# Patient Record
Sex: Female | Born: 1945 | Race: White | Hispanic: No | Marital: Married | Smoking: Former smoker
Health system: Southern US, Community
[De-identification: ages and names within clinical notes are randomized; demographics above are authoritative.]

## PROBLEM LIST (undated history)

## (undated) DIAGNOSIS — K219 Gastro-esophageal reflux disease without esophagitis: Secondary | ICD-10-CM

## (undated) DIAGNOSIS — C229 Malignant neoplasm of liver, not specified as primary or secondary: Secondary | ICD-10-CM

## (undated) DIAGNOSIS — F32A Depression, unspecified: Secondary | ICD-10-CM

## (undated) DIAGNOSIS — F419 Anxiety disorder, unspecified: Secondary | ICD-10-CM

## (undated) DIAGNOSIS — E785 Hyperlipidemia, unspecified: Secondary | ICD-10-CM

## (undated) DIAGNOSIS — F329 Major depressive disorder, single episode, unspecified: Secondary | ICD-10-CM

## (undated) DIAGNOSIS — C3490 Malignant neoplasm of unspecified part of unspecified bronchus or lung: Secondary | ICD-10-CM

## (undated) DIAGNOSIS — I10 Essential (primary) hypertension: Secondary | ICD-10-CM

## (undated) HISTORY — DX: Essential (primary) hypertension: I10

## (undated) HISTORY — DX: Depression, unspecified: F32.A

## (undated) HISTORY — DX: Hyperlipidemia, unspecified: E78.5

## (undated) HISTORY — PX: GANGLION CYST EXCISION: SHX1691

## (undated) HISTORY — PX: BUNIONECTOMY: SHX129

## (undated) HISTORY — DX: Gastro-esophageal reflux disease without esophagitis: K21.9

## (undated) HISTORY — PX: CHOLECYSTECTOMY: SHX55

## (undated) HISTORY — DX: Anxiety disorder, unspecified: F41.9

## (undated) HISTORY — DX: Malignant neoplasm of unspecified part of unspecified bronchus or lung: C34.90

---
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
## 2000-03-26 ENCOUNTER — Other Ambulatory Visit: Admission: RE | Admit: 2000-03-26 | Discharge: 2000-03-26 | Payer: Self-pay | Admitting: Podiatry

## 2004-06-22 ENCOUNTER — Ambulatory Visit: Payer: Self-pay | Admitting: Family Medicine

## 2005-06-18 ENCOUNTER — Ambulatory Visit: Payer: Self-pay | Admitting: Family Medicine

## 2005-07-05 ENCOUNTER — Ambulatory Visit: Payer: Self-pay | Admitting: Family Medicine

## 2005-07-19 ENCOUNTER — Other Ambulatory Visit: Payer: Self-pay

## 2005-07-25 ENCOUNTER — Ambulatory Visit: Payer: Self-pay | Admitting: Unknown Physician Specialty

## 2006-07-31 ENCOUNTER — Ambulatory Visit: Payer: Self-pay | Admitting: Internal Medicine

## 2007-08-18 ENCOUNTER — Ambulatory Visit: Payer: Self-pay | Admitting: Internal Medicine

## 2008-11-11 ENCOUNTER — Ambulatory Visit: Payer: Self-pay | Admitting: Internal Medicine

## 2009-11-14 ENCOUNTER — Ambulatory Visit: Payer: Self-pay | Admitting: Internal Medicine

## 2010-01-08 ENCOUNTER — Ambulatory Visit: Payer: Self-pay | Admitting: Internal Medicine

## 2010-05-28 HISTORY — PX: COLONOSCOPY: SHX174

## 2010-06-12 ENCOUNTER — Ambulatory Visit: Payer: Self-pay | Admitting: Unknown Physician Specialty

## 2010-06-16 ENCOUNTER — Ambulatory Visit: Payer: Self-pay | Admitting: Otolaryngology

## 2010-12-14 ENCOUNTER — Ambulatory Visit: Payer: Self-pay | Admitting: Internal Medicine

## 2011-07-21 ENCOUNTER — Ambulatory Visit: Payer: Self-pay | Admitting: Unknown Physician Specialty

## 2011-09-10 ENCOUNTER — Ambulatory Visit: Payer: Self-pay | Admitting: Internal Medicine

## 2011-09-14 ENCOUNTER — Ambulatory Visit: Payer: Self-pay | Admitting: Internal Medicine

## 2011-09-17 ENCOUNTER — Ambulatory Visit: Payer: Self-pay | Admitting: Oncology

## 2011-09-17 LAB — CBC CANCER CENTER
Basophil #: 0.1 x10 3/mm (ref 0.0–0.1)
Eosinophil #: 0.1 x10 3/mm (ref 0.0–0.7)
HCT: 38.6 % (ref 35.0–47.0)
Lymphocyte #: 2 x10 3/mm (ref 1.0–3.6)
Lymphocyte %: 25.4 %
MCV: 88 fL (ref 80–100)
Monocyte %: 9 %
Platelet: 294 x10 3/mm (ref 150–440)
RDW: 13.6 % (ref 11.5–14.5)
WBC: 7.8 x10 3/mm (ref 3.6–11.0)

## 2011-09-17 LAB — COMPREHENSIVE METABOLIC PANEL
Albumin: 4.3 g/dL (ref 3.4–5.0)
Alkaline Phosphatase: 85 U/L (ref 50–136)
Anion Gap: 8 (ref 7–16)
BUN: 14 mg/dL (ref 7–18)
Calcium, Total: 9.6 mg/dL (ref 8.5–10.1)
EGFR (African American): >60
Glucose: 81 mg/dL (ref 65–99)
Osmolality: 275 (ref 275–301)
Potassium: 3.5 mmol/L (ref 3.5–5.1)
SGPT (ALT): 32 U/L

## 2011-09-17 LAB — PROTIME-INR: Prothrombin Time: 12.3 secs (ref 11.5–14.7)

## 2011-09-17 LAB — APTT: Activated PTT: 28.4 secs (ref 23.6–35.9)

## 2011-09-25 ENCOUNTER — Ambulatory Visit: Payer: Self-pay | Admitting: Cardiothoracic Surgery

## 2011-09-26 ENCOUNTER — Ambulatory Visit: Payer: Self-pay | Admitting: Cardiothoracic Surgery

## 2011-09-27 ENCOUNTER — Ambulatory Visit: Payer: Self-pay | Admitting: Oncology

## 2011-09-27 DIAGNOSIS — C3490 Malignant neoplasm of unspecified part of unspecified bronchus or lung: Secondary | ICD-10-CM

## 2011-09-27 HISTORY — PX: OTHER SURGICAL HISTORY: SHX169

## 2011-09-27 HISTORY — DX: Malignant neoplasm of unspecified part of unspecified bronchus or lung: C34.90

## 2011-10-04 DIAGNOSIS — C7A090 Malignant carcinoid tumor of the bronchus and lung: Secondary | ICD-10-CM | POA: Insufficient documentation

## 2011-10-04 DIAGNOSIS — I10 Essential (primary) hypertension: Secondary | ICD-10-CM | POA: Insufficient documentation

## 2011-10-04 DIAGNOSIS — E782 Mixed hyperlipidemia: Secondary | ICD-10-CM | POA: Insufficient documentation

## 2011-10-28 ENCOUNTER — Ambulatory Visit: Payer: Self-pay | Admitting: Oncology

## 2012-02-18 ENCOUNTER — Ambulatory Visit: Payer: Self-pay | Admitting: Internal Medicine

## 2012-08-27 DIAGNOSIS — K5900 Constipation, unspecified: Secondary | ICD-10-CM | POA: Insufficient documentation

## 2012-08-27 DIAGNOSIS — M1990 Unspecified osteoarthritis, unspecified site: Secondary | ICD-10-CM | POA: Insufficient documentation

## 2012-09-14 DIAGNOSIS — D649 Anemia, unspecified: Secondary | ICD-10-CM | POA: Insufficient documentation

## 2013-02-25 ENCOUNTER — Ambulatory Visit: Payer: Self-pay | Admitting: Family Medicine

## 2014-03-01 ENCOUNTER — Ambulatory Visit: Payer: Self-pay | Admitting: Family Medicine

## 2015-04-20 ENCOUNTER — Other Ambulatory Visit: Payer: Self-pay | Admitting: Family Medicine

## 2015-04-26 ENCOUNTER — Other Ambulatory Visit: Payer: Self-pay | Admitting: Family Medicine

## 2015-04-26 DIAGNOSIS — N951 Menopausal and female climacteric states: Secondary | ICD-10-CM

## 2015-04-26 DIAGNOSIS — Z1231 Encounter for screening mammogram for malignant neoplasm of breast: Secondary | ICD-10-CM

## 2015-05-02 ENCOUNTER — Ambulatory Visit
Admission: RE | Admit: 2015-05-02 | Discharge: 2015-05-02 | Disposition: A | Discharge status: Home / Self Care | Payer: Medicare HMO | Source: Ambulatory Visit | Attending: Family Medicine | Admitting: Family Medicine

## 2015-05-02 DIAGNOSIS — Z1231 Encounter for screening mammogram for malignant neoplasm of breast: Secondary | ICD-10-CM | POA: Diagnosis not present

## 2015-05-02 DIAGNOSIS — N951 Menopausal and female climacteric states: Secondary | ICD-10-CM

## 2015-05-02 DIAGNOSIS — M8580 Other specified disorders of bone density and structure, unspecified site: Secondary | ICD-10-CM | POA: Diagnosis not present

## 2015-05-02 DIAGNOSIS — M85852 Other specified disorders of bone density and structure, left thigh: Secondary | ICD-10-CM | POA: Diagnosis not present

## 2015-05-05 DIAGNOSIS — R69 Illness, unspecified: Secondary | ICD-10-CM | POA: Diagnosis not present

## 2015-05-13 DIAGNOSIS — I10 Essential (primary) hypertension: Secondary | ICD-10-CM | POA: Diagnosis not present

## 2015-05-13 DIAGNOSIS — R319 Hematuria, unspecified: Secondary | ICD-10-CM | POA: Diagnosis not present

## 2015-05-13 DIAGNOSIS — E7800 Pure hypercholesterolemia, unspecified: Secondary | ICD-10-CM | POA: Diagnosis not present

## 2015-05-13 DIAGNOSIS — D649 Anemia, unspecified: Secondary | ICD-10-CM | POA: Diagnosis not present

## 2015-05-20 DIAGNOSIS — E7800 Pure hypercholesterolemia, unspecified: Secondary | ICD-10-CM | POA: Diagnosis not present

## 2015-05-20 DIAGNOSIS — Z0000 Encounter for general adult medical examination without abnormal findings: Secondary | ICD-10-CM | POA: Diagnosis not present

## 2015-05-20 DIAGNOSIS — R319 Hematuria, unspecified: Secondary | ICD-10-CM | POA: Diagnosis not present

## 2015-05-20 DIAGNOSIS — M8580 Other specified disorders of bone density and structure, unspecified site: Secondary | ICD-10-CM | POA: Insufficient documentation

## 2015-05-20 DIAGNOSIS — I10 Essential (primary) hypertension: Secondary | ICD-10-CM | POA: Diagnosis not present

## 2015-06-23 ENCOUNTER — Encounter: Payer: Self-pay | Admitting: Internal Medicine

## 2015-06-23 ENCOUNTER — Other Ambulatory Visit: Payer: Self-pay | Admitting: Internal Medicine

## 2015-06-30 ENCOUNTER — Encounter: Payer: Self-pay | Admitting: Internal Medicine

## 2015-06-30 DIAGNOSIS — N952 Postmenopausal atrophic vaginitis: Secondary | ICD-10-CM | POA: Insufficient documentation

## 2015-06-30 DIAGNOSIS — K219 Gastro-esophageal reflux disease without esophagitis: Secondary | ICD-10-CM | POA: Insufficient documentation

## 2015-06-30 DIAGNOSIS — G4700 Insomnia, unspecified: Secondary | ICD-10-CM | POA: Insufficient documentation

## 2015-07-01 ENCOUNTER — Ambulatory Visit (INDEPENDENT_AMBULATORY_CARE_PROVIDER_SITE_OTHER): Payer: Medicare HMO | Admitting: Internal Medicine

## 2015-07-01 ENCOUNTER — Encounter: Payer: Self-pay | Admitting: Internal Medicine

## 2015-07-01 VITALS — BP 124/80 | HR 70 | Ht 63.0 in | Wt 168.0 lb

## 2015-07-01 DIAGNOSIS — G4700 Insomnia, unspecified: Secondary | ICD-10-CM

## 2015-07-01 DIAGNOSIS — M129 Arthropathy, unspecified: Secondary | ICD-10-CM

## 2015-07-01 DIAGNOSIS — G5603 Carpal tunnel syndrome, bilateral upper limbs: Secondary | ICD-10-CM

## 2015-07-01 DIAGNOSIS — I10 Essential (primary) hypertension: Secondary | ICD-10-CM | POA: Diagnosis not present

## 2015-07-01 DIAGNOSIS — M1712 Unilateral primary osteoarthritis, left knee: Secondary | ICD-10-CM | POA: Insufficient documentation

## 2015-07-01 DIAGNOSIS — E782 Mixed hyperlipidemia: Secondary | ICD-10-CM | POA: Diagnosis not present

## 2015-07-01 DIAGNOSIS — K219 Gastro-esophageal reflux disease without esophagitis: Secondary | ICD-10-CM

## 2015-07-01 DIAGNOSIS — R319 Hematuria, unspecified: Secondary | ICD-10-CM | POA: Diagnosis not present

## 2015-07-01 LAB — POC URINALYSIS WITH MICROSCOPIC (NON AUTO)MANUAL RESULT
BACTERIA UA: 0
BILIRUBIN UA: NEGATIVE
CRYSTALS: 0
EPITHELIAL CELLS, URINE PER MICROSCOPY: 2
Glucose, UA: NEGATIVE
Ketones, UA: NEGATIVE
Mucus, UA: 0
Nitrite, UA: NEGATIVE
PH UA: 7
PROTEIN UA: NEGATIVE
RBC: 1 M/uL — AB (ref 4.04–5.48)
Spec Grav, UA: 1.01
UROBILINOGEN UA: 0.2
WBC CASTS UA: 0

## 2015-07-01 NOTE — Patient Instructions (Signed)

## 2015-07-01 NOTE — Progress Notes (Signed)
Date:  07/01/2015   Name:  Alexandra Watson   DOB:  26-Oct-1945   MRN:  [id number]  Previous patient in my practice.  She transferred to [facility] after she was treated at [facility] for lung cancer.  She has decided to return to my clinic.  Chief Complaint: Establish Care Hypertension This is a chronic problem. The current episode started more than 1 year ago. The problem is unchanged. The problem is controlled. Pertinent negatives include no chest pain, headaches, palpitations or shortness of breath.  Hyperlipidemia This is a chronic problem. The current episode started more than 1 year ago. The problem is controlled. Pertinent negatives include no chest pain, myalgias or shortness of breath. Current antihyperlipidemic treatment includes statins (recently increased zocor). There are no compliance problems.   Hematuria - she has had some intermittent hematuria over the past year.  Twice she had E coli infection and twice she had mixed flora.  Her last UA showed only 1 RBC so no referral was done. Numbness - intermittent numbness in hands - wakes her at night and occurs when reading the newspaper.  No weakness and only slight discomfort. Insomnia - she stopped temazepam and is now taking trazodone along with benedryl if needed.  We discussed avoiding benedryl and perhaps taking 2 trazodone instead. 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  She is now on a 12 month follow up interval. 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  It is slowing worsening.  She is wondering about getting an Ortho evaluation, perhaps at [facility] or with Dr. Marry Guan.  Review of Systems  Constitutional: Negative for fever, chills and unexpected weight change.  Respiratory: Negative for cough, shortness of breath and wheezing.   Cardiovascular: Negative for chest pain, palpitations and leg swelling.  Gastrointestinal: Positive for constipation. Negative for vomiting, abdominal pain and blood in stool.  Genitourinary:  Positive for hematuria. Negative for dysuria, vaginal discharge, vaginal pain and pelvic pain.  Musculoskeletal: Positive for joint swelling, arthralgias and gait problem. Negative for myalgias.  Skin: Negative for rash.  Neurological: Positive for numbness. Negative for dizziness, tremors, weakness and headaches.  Psychiatric/Behavioral: Positive for sleep disturbance. Negative for dysphoric mood and decreased concentration. The patient is not nervous/anxious.     Patient Active Problem List   Diagnosis Date Noted  . Insomnia 06/30/2015  . Atrophic vaginitis 06/30/2015  . GERD (gastroesophageal reflux disease) 06/30/2015  . Osteopenia 05/20/2015  . Anemia 09/14/2012  . Constipation 08/27/2012  . Arthritis 08/27/2012  . Hypertension 10/04/2011  . Hyperlipidemia, mixed 10/04/2011  . 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 Authorizing Provider  hydrochlorothiazide (HYDRODIURIL) 25 MG tablet Take 1 tablet by mouth daily. 02/25/15 09/02/15 Yes Historical Provider, MD  metoprolol (LOPRESSOR) 50 MG tablet Take 1 tablet by mouth daily. 02/25/15 09/02/15 Yes Historical Provider, MD  ranitidine (ZANTAC) 150 MG tablet Take 1 tablet by mouth 2 (two) times daily. 09/14/14 09/14/15 Yes Historical Provider, MD  simvastatin (ZOCOR) 20 MG tablet Take 20 mg by mouth daily.   Yes Historical Provider, MD  traZODone (DESYREL) 50 MG tablet Take 50 mg by mouth at bedtime as needed for sleep.   Yes Historical Provider, MD  conjugated estrogens (PREMARIN) vaginal cream Reported on 07/01/2015 06/18/14   Historical Provider, MD    Allergies  Allergen Reactions  . Iodinated Diagnostic Agents Hives    Past Surgical History  Procedure Laterality  Date  . Vats sleeve lobectomy Left 09/2011    typical variant carcinoid  . Bunionectomy Bilateral 2001, 2005  . Colonoscopy  05/2010    normal  . Cholecystectomy    . Ganglion cyst excision Left      Social History  Substance Use Topics  . Smoking status: Former Research scientist (life sciences)  . Smokeless tobacco: None  . Alcohol Use: 1.2 oz/week    2 Standard drinks or equivalent per week     Medication list has been reviewed and updated.   Physical Exam  Constitutional: She is oriented to person, place, and time. She appears well-developed. No distress.  HENT:  Head: Normocephalic and atraumatic.  Neck: Normal range of motion. Neck supple. Carotid bruit is not present. No thyromegaly present.  Cardiovascular: Normal rate, regular rhythm and normal heart sounds.   Pulmonary/Chest: Effort normal and breath sounds normal. No respiratory distress. She has no wheezes. She has no rhonchi.  Musculoskeletal:       Left knee: She exhibits decreased range of motion. She exhibits no effusion.  Positive Tinel's on left, negative phalen's Negative tinel's and phalens on right Grip 5/5 Pulses intact  Neurological: She is alert and oriented to person, place, and time.  Skin: Skin is warm and dry. No rash noted.  Psychiatric: She has a normal mood and affect. Her speech is normal and behavior is normal. Thought content normal.    BP 124/80 mmHg  Pulse 70  Ht '5\' 3"'$  (1.6 m)  Wt 168 lb (76.204 kg)  BMI 29.77 kg/m2  Assessment and Plan: 1. Carpal tunnel syndrome, bilateral Wear cock-up wrist splints while sleeping  2. Essential hypertension controlled  3. Hyperlipidemia, mixed On statin therapy  4. Gastroesophageal reflux disease, esophagitis presence not specified Stable on H2 blocker  5. Insomnia Can take trazodone 100 mg at hs if needed Avoid diphenhydramine  6. Hematuria Will refer to Urology if persistent - POC urinalysis w microscopic (non auto)  7. Arthritis Consider Ortho referral for left knee  Halina Maidens, MD [facility]  07/01/2015

## 2015-07-28 HISTORY — PX: ANKLE ARTHROSCOPY WITH OPEN REDUCTION INTERNAL FIXATION (ORIF): SHX5582

## 2015-08-02 DIAGNOSIS — M5412 Radiculopathy, cervical region: Secondary | ICD-10-CM | POA: Diagnosis not present

## 2015-08-02 DIAGNOSIS — M9901 Segmental and somatic dysfunction of cervical region: Secondary | ICD-10-CM | POA: Diagnosis not present

## 2015-08-02 DIAGNOSIS — M9902 Segmental and somatic dysfunction of thoracic region: Secondary | ICD-10-CM | POA: Diagnosis not present

## 2015-08-02 DIAGNOSIS — M62830 Muscle spasm of back: Secondary | ICD-10-CM | POA: Diagnosis not present

## 2015-08-03 DIAGNOSIS — M5412 Radiculopathy, cervical region: Secondary | ICD-10-CM | POA: Diagnosis not present

## 2015-08-03 DIAGNOSIS — M9902 Segmental and somatic dysfunction of thoracic region: Secondary | ICD-10-CM | POA: Diagnosis not present

## 2015-08-03 DIAGNOSIS — M9901 Segmental and somatic dysfunction of cervical region: Secondary | ICD-10-CM | POA: Diagnosis not present

## 2015-08-03 DIAGNOSIS — M62830 Muscle spasm of back: Secondary | ICD-10-CM | POA: Diagnosis not present

## 2015-08-05 DIAGNOSIS — M9901 Segmental and somatic dysfunction of cervical region: Secondary | ICD-10-CM | POA: Diagnosis not present

## 2015-08-05 DIAGNOSIS — M5412 Radiculopathy, cervical region: Secondary | ICD-10-CM | POA: Diagnosis not present

## 2015-08-05 DIAGNOSIS — M9902 Segmental and somatic dysfunction of thoracic region: Secondary | ICD-10-CM | POA: Diagnosis not present

## 2015-08-05 DIAGNOSIS — M62830 Muscle spasm of back: Secondary | ICD-10-CM | POA: Diagnosis not present

## 2015-08-07 ENCOUNTER — Emergency Department
Admission: EM | Admit: 2015-08-07 | Discharge: 2015-08-08 | Payer: Medicare HMO | Attending: Emergency Medicine | Admitting: Emergency Medicine

## 2015-08-07 ENCOUNTER — Encounter: Payer: Self-pay | Admitting: Emergency Medicine

## 2015-08-07 ENCOUNTER — Emergency Department: Payer: Medicare HMO

## 2015-08-07 DIAGNOSIS — Z87891 Personal history of nicotine dependence: Secondary | ICD-10-CM | POA: Insufficient documentation

## 2015-08-07 DIAGNOSIS — Z79899 Other long term (current) drug therapy: Secondary | ICD-10-CM | POA: Diagnosis not present

## 2015-08-07 DIAGNOSIS — Y929 Unspecified place or not applicable: Secondary | ICD-10-CM | POA: Diagnosis not present

## 2015-08-07 DIAGNOSIS — Z85118 Personal history of other malignant neoplasm of bronchus and lung: Secondary | ICD-10-CM | POA: Diagnosis not present

## 2015-08-07 DIAGNOSIS — Y9301 Activity, walking, marching and hiking: Secondary | ICD-10-CM | POA: Diagnosis not present

## 2015-08-07 DIAGNOSIS — I10 Essential (primary) hypertension: Secondary | ICD-10-CM | POA: Diagnosis not present

## 2015-08-07 DIAGNOSIS — S99912A Unspecified injury of left ankle, initial encounter: Secondary | ICD-10-CM | POA: Diagnosis not present

## 2015-08-07 DIAGNOSIS — Y999 Unspecified external cause status: Secondary | ICD-10-CM | POA: Insufficient documentation

## 2015-08-07 DIAGNOSIS — Y33XXXA Other specified events, undetermined intent, initial encounter: Secondary | ICD-10-CM | POA: Diagnosis not present

## 2015-08-07 DIAGNOSIS — S82392A Other fracture of lower end of left tibia, initial encounter for closed fracture: Secondary | ICD-10-CM | POA: Diagnosis not present

## 2015-08-07 DIAGNOSIS — Z7982 Long term (current) use of aspirin: Secondary | ICD-10-CM | POA: Insufficient documentation

## 2015-08-07 DIAGNOSIS — W108XXA Fall (on) (from) other stairs and steps, initial encounter: Secondary | ICD-10-CM | POA: Diagnosis not present

## 2015-08-07 DIAGNOSIS — S82852A Displaced trimalleolar fracture of left lower leg, initial encounter for closed fracture: Secondary | ICD-10-CM | POA: Insufficient documentation

## 2015-08-07 DIAGNOSIS — E785 Hyperlipidemia, unspecified: Secondary | ICD-10-CM | POA: Diagnosis not present

## 2015-08-07 DIAGNOSIS — M1712 Unilateral primary osteoarthritis, left knee: Secondary | ICD-10-CM | POA: Insufficient documentation

## 2015-08-07 DIAGNOSIS — M7989 Other specified soft tissue disorders: Secondary | ICD-10-CM | POA: Diagnosis not present

## 2015-08-07 DIAGNOSIS — W109XXA Fall (on) (from) unspecified stairs and steps, initial encounter: Secondary | ICD-10-CM | POA: Diagnosis not present

## 2015-08-07 DIAGNOSIS — M21962 Unspecified acquired deformity of left lower leg: Secondary | ICD-10-CM

## 2015-08-07 DIAGNOSIS — S82851A Displaced trimalleolar fracture of right lower leg, initial encounter for closed fracture: Secondary | ICD-10-CM | POA: Diagnosis not present

## 2015-08-07 DIAGNOSIS — S82853A Displaced trimalleolar fracture of unspecified lower leg, initial encounter for closed fracture: Secondary | ICD-10-CM

## 2015-08-07 DIAGNOSIS — S82892A Other fracture of left lower leg, initial encounter for closed fracture: Secondary | ICD-10-CM

## 2015-08-07 DIAGNOSIS — M25572 Pain in left ankle and joints of left foot: Secondary | ICD-10-CM | POA: Diagnosis not present

## 2015-08-07 LAB — APTT: aPTT: 28 seconds (ref 24–36)

## 2015-08-07 LAB — CBC
HCT: 36.1 % (ref 35.0–47.0)
Hemoglobin: 12.3 g/dL (ref 12.0–16.0)
MCH: 29.5 pg (ref 26.0–34.0)
MCHC: 34.2 g/dL (ref 32.0–36.0)
MCV: 86.2 fL (ref 80.0–100.0)
PLATELETS: 232 10*3/uL (ref 150–440)
RBC: 4.19 MIL/uL (ref 3.80–5.20)
RDW: 13.1 % (ref 11.5–14.5)
WBC: 6.3 10*3/uL (ref 3.6–11.0)

## 2015-08-07 LAB — BASIC METABOLIC PANEL
ANION GAP: 9 (ref 5–15)
BUN: 20 mg/dL (ref 6–20)
CO2: 27 mmol/L (ref 22–32)
Calcium: 9.3 mg/dL (ref 8.9–10.3)
Chloride: 97 mmol/L — ABNORMAL LOW (ref 101–111)
Creatinine, Ser: 0.94 mg/dL (ref 0.44–1.00)
GLUCOSE: 94 mg/dL (ref 65–99)
POTASSIUM: 3.4 mmol/L — AB (ref 3.5–5.1)
Sodium: 133 mmol/L — ABNORMAL LOW (ref 135–145)

## 2015-08-07 LAB — PROTIME-INR
INR: 0.97
PROTHROMBIN TIME: 13.1 s (ref 11.4–15.0)

## 2015-08-07 MED ORDER — FENTANYL CITRATE (PF) 100 MCG/2ML IJ SOLN
50.0000 ug | Freq: Once | INTRAMUSCULAR | Status: AC
  Administered 2015-08-07: 50 ug via INTRAVENOUS
  Filled 2015-08-07: qty 2

## 2015-08-07 MED ORDER — FENTANYL CITRATE (PF) 100 MCG/2ML IJ SOLN
75.0000 ug | Freq: Once | INTRAMUSCULAR | Status: AC
  Administered 2015-08-07: 75 ug via INTRAVENOUS
  Filled 2015-08-07: qty 2

## 2015-08-07 MED ORDER — ONDANSETRON HCL 4 MG/2ML IJ SOLN
4.0000 mg | Freq: Once | INTRAMUSCULAR | Status: AC
  Administered 2015-08-07: 4 mg via INTRAVENOUS
  Filled 2015-08-07: qty 2

## 2015-08-07 NOTE — ED Notes (Addendum)
Doppler used to find dorsal pedal pulse on left foot.

## 2015-08-07 NOTE — ED Notes (Signed)
Dr. Rudene Christians at bedside applying splint and speaking to patient/family.

## 2015-08-07 NOTE — ED Provider Notes (Addendum)
[facility] Emergency Department Provider Note  ____________________________________________  Time seen: Approximately 8:44 PM  I have reviewed the triage vital signs and the nursing notes.   HISTORY  Chief Complaint Fall    HPI Alexandra Watson is a 70 y.o. female reports history of hypertension.  Patient missed the last all walking down stairs. She fell forward, had severe pain and twisting of the left ankle. She was unable to walk. No numbness tingling or cold foot but reports all the pain and swelling is located about the left ankle. Did not strike her head, hit her neck, or sustain other injury.  Severe, left ankle pain. Moderate to severe swelling. No recent illness, fevers chills. No chest pain or shortness of breath. Fall occurred because she "missed a step" in the dark.   Past Medical History  Diagnosis Date  . Lung cancer ([place]) 09/2011    left lung broncial ca  . Hypertension   . Hyperlipidemia   . GERD (gastroesophageal reflux disease)     Patient Active Problem List   Diagnosis Date Noted  . Hematuria 07/01/2015  . Arthritis of knee, left 07/01/2015  . Insomnia 06/30/2015  . Atrophic vaginitis 06/30/2015  . GERD (gastroesophageal reflux disease) 06/30/2015  . Osteopenia 05/20/2015  . Anemia 09/14/2012  . Constipation 08/27/2012  . Arthritis 08/27/2012  . Hypertension 10/04/2011  . Hyperlipidemia, mixed 10/04/2011  . Carcinoid tumor 10/04/2011    Past Surgical History  Procedure Laterality Date  . Vats sleeve lobectomy Left 09/2011    typical variant carcinoid  . Bunionectomy Bilateral 2001, 2005  . Colonoscopy  05/2010    normal  . Cholecystectomy    . Ganglion cyst excision Left     Current Outpatient Rx  Name  Route  Sig  Dispense  Refill  . Calcium Carbonate-Vit D-Min (CALCIUM 1200 PO)   Oral   Take 2,400 mg by mouth.         . Cholecalciferol (VITAMIN D3) 5000 units CAPS   Oral   Take 5,000 Units by mouth.          . conjugated estrogens (PREMARIN) vaginal cream      Reported on 07/01/2015         . hydrochlorothiazide (HYDRODIURIL) 25 MG tablet   Oral   Take 1 tablet by mouth daily.         . metoprolol (LOPRESSOR) 50 MG tablet   Oral   Take 1 tablet by mouth daily.         . polyethylene glycol (MIRALAX / GLYCOLAX) packet   Oral   Take 9 g by mouth daily.         . ranitidine (ZANTAC) 150 MG tablet   Oral   Take 1 tablet by mouth 2 (two) times daily.         . simvastatin (ZOCOR) 20 MG tablet   Oral   Take 20 mg by mouth daily.         . traZODone (DESYREL) 50 MG tablet   Oral   Take 50 mg by mouth at bedtime as needed for sleep.           Allergies Iodinated diagnostic agents  Family History  Problem Relation Age of Onset  . CAD Brother   . CAD Father   . Stroke Father   . Stroke Mother     Social History Social History  Substance Use Topics  . Smoking status: Former Research scientist (life sciences)  . Smokeless tobacco: None  .  Alcohol Use: 1.2 oz/week    2 Standard drinks or equivalent per week    Review of Systems Constitutional: No fever/chills Eyes: No visual changes. ENT: No sore throat. Cardiovascular: Denies chest pain. Respiratory: Denies shortness of breath. Gastrointestinal: No abdominal pain.  No nausea, no vomiting.  No diarrhea.  No constipation. Genitourinary: Negative for dysuria. Musculoskeletal: Negative for back pain.No neck pain. Skin: Negative for rash. Neurological: Negative for headaches, focal weakness or numbness.  10-point ROS otherwise negative.  ____________________________________________   PHYSICAL EXAM:  VITAL SIGNS: ED Triage Vitals  Enc Vitals Group     BP 08/07/15 1951 159/72 mmHg     Pulse Rate 08/07/15 1951 74     Resp 08/07/15 1951 18     Temp 08/07/15 1951 98.8 F (37.1 C)     Temp Source 08/07/15 1951 Oral     SpO2 08/07/15 1951 99 %     Weight 08/07/15 1951 165 lb (74.844 kg)     Height 08/07/15 1951 '5\' 4"'$   (1.626 m)     Head Cir --      Peak Flow --      Pain Score 08/07/15 1953 8     Pain Loc --      Pain Edu? --      Excl. in [place]? --    Constitutional: Alert and oriented. Well appearing and in no acute distress. Eyes: Conjunctivae are normal. PERRL. EOMI. Head: Atraumatic. Nose: No congestion/rhinnorhea. Mouth/Throat: Mucous membranes are moist.  Oropharynx non-erythematous. Neck: No stridor.  No cervical tenderness Cardiovascular: Normal rate, regular rhythm. Grossly normal heart sounds.  Good peripheral circulation. Respiratory: Normal respiratory effort.  No retractions. Lungs CTAB. Gastrointestinal: Soft and nontender. No distention.  Musculoskeletal:   Lower Extremities  No edema. Normal DP/PT pulses bilateral with good cap refill.  Normal neuro-motor function lower extremities bilateral.  RIGHT Right lower extremity demonstrates normal strength, good use of all muscles. No edema bruising or contusions of the right hip, right knee, right ankle. Full range of motion of the right lower extremity without pain. No pain on axial loading. No evidence of trauma.  LEFT Left lower extremity demonstrates normal strength, good use of all muscles aside from left ankle and foot which the patient cannot move well due to pain. No edema bruising or contusions of the hip,  knee, there is focal and moderate left ankle edema with mild deformity. Dopplerable dorsalis pedis pulse. The foot appears warm, with normal capillary refill in all toes. Able to wiggle toes slightly but induces pain at the left ankle. No pain or tenderness at the proximal lower leg or in the mid foot/toes.  Neurologic:  Normal speech and language. No gross focal neurologic deficits are appreciated. Skin:  Skin is warm, dry and intact. No rash noted. Psychiatric: Mood and affect are normal. Speech and behavior are normal.  ____________________________________________   LABS (all labs ordered are listed, but only abnormal  results are displayed)  Labs Reviewed  BASIC METABOLIC PANEL - Abnormal; Notable for the following:    Sodium 133 (*)    Potassium 3.4 (*)    Chloride 97 (*)    All other components within normal limits  CBC  PROTIME-INR  APTT   ____________________________________________  EKG   ____________________________________________  RADIOLOGY   DG Tibia/Fibula Left (Final result) Result time: 08/07/15 20:28:33   Final result by Rad Results In Interface (08/07/15 20:28:33)   Narrative:   CLINICAL DATA: C/o pain swelling after missing step and falling  today  EXAM: LEFT TIBIA AND FIBULA - 2 VIEW  COMPARISON: None.  FINDINGS: Significantly displaced bimalleolar fracture as described on report of ankle. No other abnormalities in the tibia or fibula more proximally.  IMPRESSION: Fracture distal tibia and fibula as described in report of ankle radiographs.   Electronically Signed By: Skipper Cliche M.D. On: 08/07/2015 20:28          DG Ankle Complete Left (Final result) Result time: 08/07/15 20:27:53   Procedure changed from DG Ankle 2 Views Left      Final result by Rad Results In Interface (08/07/15 20:27:53)   Narrative:   CLINICAL DATA: C/o pain swelling after missing step and falling today  EXAM: LEFT ANKLE COMPLETE - 3+ VIEW  COMPARISON: None.  FINDINGS: Comminuted fracture distal fibula with lateral displacement of distal fracture fragment. There is a fracture of the medial malleolus with medial translation of the tibia of the talus, while medial malleolus remains in dyspnea position. There appears to be a fracture of the posterior malleolus as well. The tibia is translated anteriorly of the tailor dome.  IMPRESSION: Evidence of significantly displaced try malleolar fracture with disruption of the mortise. Consider CT to better define anatomy.   Electronically Signed By: Skipper Cliche M.D. On: 08/07/2015 20:27     ____________________________________________   PROCEDURES  Procedure(s) performed: None  Critical Care performed: No  ____________________________________________   INITIAL IMPRESSION / ASSESSMENT AND PLAN / ED COURSE  Pertinent labs & imaging results that were available during my care of the patient were reviewed by me and considered in my medical decision making (see chart for details).  Patient presents for isolated left lower leg fracture. Seen and stabilized by Dr. Kyla Balzarine. Patient being admitted, with pain well controlled with fentanyl. Labs reviewed, no acute concerns noted. Patient will be admitted to orthopedics for a plan for surgery. No other evidence of injury or trauma. ____________________________________________   FINAL CLINICAL IMPRESSION(S) / ED DIAGNOSES  Final diagnoses:  Closed left ankle fracture, initial encounter  Trimalleolar fracture of ankle, closed, left, initial encounter      Delman Kitten, MD 08/07/15 2047   CT Ankle Left Wo Contrast (Final result) Result time: 08/07/15 21:51:22   Final result by Rad Results In Interface (08/07/15 21:51:22)   Narrative:   CLINICAL DATA: Status post fall while walking downstairs, with severe pain and twisting injury to left ankle. Initial encounter.  EXAM: CT OF THE LEFT ANKLE WITHOUT CONTRAST  TECHNIQUE: Multidetector CT imaging of the left ankle was performed according to the standard protocol. Multiplanar CT image reconstructions were also generated.  COMPARISON: Left ankle radiographs performed earlier today at 8:01 p.m.  FINDINGS: There are comminuted fractures of the medial, lateral and posterior malleoli, with lateral displacement of the medial and lateral malleolar fracture fragments, and minimal displacement of the posterior malleolar fracture fragments. There also appears to be a tiny fracture fragment within the joint space at the superior ankle mortise, possibly arising from the  lateral edge of the distal tibia.  There is associated lateral displacement and slight lateral tilt of the talus, with marked widening of the medial ankle mortise, and narrowing of the superior ankle mortise. A small accessory ossicle is noted adjacent to the navicular.  Soft tissue swelling is noted about the medial and lateral aspects of the ankle. Flexor and extensor tendons are grossly unremarkable. The peroneal tendons appear grossly intact. Kager's fat pad is unremarkable in appearance. A small ankle joint effusion is noted. Mild edema  is noted at the sinus tarsi.  IMPRESSION: 1. Comminuted trimalleolar fracture, with lateral displacement of the medial and lateral malleolar fragments. 2. Tiny fracture fragment within the joint space at the superior ankle mortise, possibly arising from the lateral edge of the distal tibia. 3. Lateral displacement and slight lateral tilt of the talus, with marked widening of the medial ankle mortise, and narrowing of the superior ankle mortise. 4. Soft tissue swelling about the medial and lateral aspects of the ankle. Small ankle joint effusion noted. Mild edema at the sinus tarsi.   Electronically Signed By: Garald Balding M.D. On: 08/07/2015 21:51   ----------------------------------------- 10:04 PM on 08/07/2015 -----------------------------------------    Patient changed mind about admission at [facility], and has decided and requesting txf to [facility]. Request made, and the patient has been accepted in transfer by Dr. Micheal Likens of the orthopedic service to [facility] for the patient will be evaluated in the emergency room there for admission to Ortho. Patient and family agreeable. Pain well controlled. Awake alert and stable.  Delman Kitten, MD 08/07/15 2204

## 2015-08-07 NOTE — Consult Note (Signed)
Alexandra Watson is a active 70 year old who is normally very mobile and walks without any assistive device. She very independent and takes care of herself and her husband at home. She suffered a fall on vacation [employer] school this evening at her church. No loss of consciousness she suffered a left ankle injury is brought to the emergency room where she is found to have an ankle fracture dislocation trimalleolar. Has consulted to aid in treatment and on initial evaluation her skin was noted to be intact with an abrasion anteriorly with the skin tented anteriorly but intact without pallor. Sensation intact X-ray showed transverse medial malleolus fracture comminuted distal fibula fracture with syndesmosis widening significant and apparent posterior malleolar fracture size and displacement difficult to assess on prereduction films. Reduction was carried out bringing the foot into inversion and anterior directed pressure on the calcaneus gave an audible and palpable reduction with normal alignment of the ankle clinically. A posterior splint was applied with a smaller stirrup splint to help maintain alignment with padding underneath. Ace wrap was then applied. CT scan was then recommended for evaluation of the fracture pattern. Patient is deciding whether she wants to a year ago on to [place] is Dr. Marry Guan is not available take care of her ankle fracture.  Impression is trimalleolar ankle fracture dislocation with closed reduction performed in ER Plan is dependent upon takes acceptance of patient in transfer

## 2015-08-07 NOTE — ED Notes (Signed)
[facility] transfer center called. They do not have any available trucks out at this time. Will call back with an ETA.

## 2015-08-07 NOTE — ED Notes (Signed)
Pt. States she was walking down some steps in low light setting.  Pt. States she missed last step.  Pt. Denies LOC.  Pt. Has swelling and bruising to left ankle.

## 2015-08-08 ENCOUNTER — Encounter: Payer: Self-pay | Admitting: Internal Medicine

## 2015-08-08 DIAGNOSIS — M25572 Pain in left ankle and joints of left foot: Secondary | ICD-10-CM | POA: Diagnosis not present

## 2015-08-08 DIAGNOSIS — W109XXA Fall (on) (from) unspecified stairs and steps, initial encounter: Secondary | ICD-10-CM | POA: Diagnosis not present

## 2015-08-08 DIAGNOSIS — Z85118 Personal history of other malignant neoplasm of bronchus and lung: Secondary | ICD-10-CM | POA: Diagnosis not present

## 2015-08-08 DIAGNOSIS — S82852A Displaced trimalleolar fracture of left lower leg, initial encounter for closed fracture: Secondary | ICD-10-CM | POA: Insufficient documentation

## 2015-08-08 DIAGNOSIS — Z87891 Personal history of nicotine dependence: Secondary | ICD-10-CM | POA: Diagnosis not present

## 2015-08-08 DIAGNOSIS — Z79899 Other long term (current) drug therapy: Secondary | ICD-10-CM | POA: Diagnosis not present

## 2015-08-08 DIAGNOSIS — Y33XXXA Other specified events, undetermined intent, initial encounter: Secondary | ICD-10-CM | POA: Diagnosis not present

## 2015-08-08 DIAGNOSIS — Z5181 Encounter for therapeutic drug level monitoring: Secondary | ICD-10-CM | POA: Diagnosis not present

## 2015-08-08 DIAGNOSIS — E785 Hyperlipidemia, unspecified: Secondary | ICD-10-CM | POA: Diagnosis not present

## 2015-08-08 DIAGNOSIS — S82851A Displaced trimalleolar fracture of right lower leg, initial encounter for closed fracture: Secondary | ICD-10-CM | POA: Diagnosis not present

## 2015-08-08 DIAGNOSIS — I10 Essential (primary) hypertension: Secondary | ICD-10-CM | POA: Diagnosis not present

## 2015-08-08 DIAGNOSIS — W108XXA Fall (on) (from) other stairs and steps, initial encounter: Secondary | ICD-10-CM | POA: Diagnosis not present

## 2015-08-08 DIAGNOSIS — M25579 Pain in unspecified ankle and joints of unspecified foot: Secondary | ICD-10-CM | POA: Diagnosis not present

## 2015-08-08 DIAGNOSIS — S99912A Unspecified injury of left ankle, initial encounter: Secondary | ICD-10-CM | POA: Diagnosis not present

## 2015-08-08 DIAGNOSIS — Z7982 Long term (current) use of aspirin: Secondary | ICD-10-CM | POA: Diagnosis not present

## 2015-08-08 DIAGNOSIS — M1712 Unilateral primary osteoarthritis, left knee: Secondary | ICD-10-CM | POA: Diagnosis not present

## 2015-08-08 DIAGNOSIS — S82892A Other fracture of left lower leg, initial encounter for closed fracture: Secondary | ICD-10-CM | POA: Diagnosis not present

## 2015-08-08 HISTORY — DX: Displaced trimalleolar fracture of left lower leg, initial encounter for closed fracture: S82.852A

## 2015-08-08 MED ORDER — MORPHINE SULFATE (PF) 4 MG/ML IV SOLN
4.0000 mg | Freq: Once | INTRAVENOUS | Status: AC
  Administered 2015-08-08: 4 mg via INTRAVENOUS
  Filled 2015-08-08: qty 1

## 2015-08-11 DIAGNOSIS — Y33XXXD Other specified events, undetermined intent, subsequent encounter: Secondary | ICD-10-CM | POA: Diagnosis not present

## 2015-08-11 DIAGNOSIS — S82852D Displaced trimalleolar fracture of left lower leg, subsequent encounter for closed fracture with routine healing: Secondary | ICD-10-CM | POA: Diagnosis not present

## 2015-08-11 DIAGNOSIS — M25572 Pain in left ankle and joints of left foot: Secondary | ICD-10-CM | POA: Diagnosis not present

## 2015-08-11 DIAGNOSIS — S82852A Displaced trimalleolar fracture of left lower leg, initial encounter for closed fracture: Secondary | ICD-10-CM | POA: Diagnosis not present

## 2015-08-16 DIAGNOSIS — S82899A Other fracture of unspecified lower leg, initial encounter for closed fracture: Secondary | ICD-10-CM | POA: Insufficient documentation

## 2015-08-17 DIAGNOSIS — M25572 Pain in left ankle and joints of left foot: Secondary | ICD-10-CM | POA: Diagnosis not present

## 2015-08-17 DIAGNOSIS — S82852A Displaced trimalleolar fracture of left lower leg, initial encounter for closed fracture: Secondary | ICD-10-CM | POA: Diagnosis not present

## 2015-08-18 DIAGNOSIS — M25572 Pain in left ankle and joints of left foot: Secondary | ICD-10-CM | POA: Diagnosis not present

## 2015-08-18 DIAGNOSIS — W19XXXA Unspecified fall, initial encounter: Secondary | ICD-10-CM | POA: Diagnosis not present

## 2015-08-18 DIAGNOSIS — S93432A Sprain of tibiofibular ligament of left ankle, initial encounter: Secondary | ICD-10-CM | POA: Diagnosis not present

## 2015-08-18 DIAGNOSIS — K219 Gastro-esophageal reflux disease without esophagitis: Secondary | ICD-10-CM | POA: Diagnosis not present

## 2015-08-18 DIAGNOSIS — G8918 Other acute postprocedural pain: Secondary | ICD-10-CM | POA: Diagnosis not present

## 2015-08-18 DIAGNOSIS — E785 Hyperlipidemia, unspecified: Secondary | ICD-10-CM | POA: Diagnosis not present

## 2015-08-18 DIAGNOSIS — I10 Essential (primary) hypertension: Secondary | ICD-10-CM | POA: Diagnosis not present

## 2015-08-18 DIAGNOSIS — M19041 Primary osteoarthritis, right hand: Secondary | ICD-10-CM | POA: Diagnosis not present

## 2015-08-18 DIAGNOSIS — M8580 Other specified disorders of bone density and structure, unspecified site: Secondary | ICD-10-CM | POA: Diagnosis not present

## 2015-08-18 DIAGNOSIS — M47812 Spondylosis without myelopathy or radiculopathy, cervical region: Secondary | ICD-10-CM | POA: Diagnosis not present

## 2015-08-18 DIAGNOSIS — M170 Bilateral primary osteoarthritis of knee: Secondary | ICD-10-CM | POA: Diagnosis not present

## 2015-08-18 DIAGNOSIS — S82852A Displaced trimalleolar fracture of left lower leg, initial encounter for closed fracture: Secondary | ICD-10-CM | POA: Diagnosis not present

## 2015-08-24 ENCOUNTER — Other Ambulatory Visit: Payer: Self-pay | Admitting: Internal Medicine

## 2015-08-24 MED ORDER — POLYETHYLENE GLYCOL 3350 17 G PO PACK: 17.0000 g | PACK | Freq: Every day | ORAL | Status: DC

## 2015-09-05 DIAGNOSIS — S82852D Displaced trimalleolar fracture of left lower leg, subsequent encounter for closed fracture with routine healing: Secondary | ICD-10-CM | POA: Diagnosis not present

## 2015-09-16 DIAGNOSIS — S82852A Displaced trimalleolar fracture of left lower leg, initial encounter for closed fracture: Secondary | ICD-10-CM | POA: Diagnosis not present

## 2015-09-16 DIAGNOSIS — M25572 Pain in left ankle and joints of left foot: Secondary | ICD-10-CM | POA: Diagnosis not present

## 2015-09-26 DIAGNOSIS — M25572 Pain in left ankle and joints of left foot: Secondary | ICD-10-CM | POA: Diagnosis not present

## 2015-09-26 DIAGNOSIS — S82892D Other fracture of left lower leg, subsequent encounter for closed fracture with routine healing: Secondary | ICD-10-CM | POA: Diagnosis not present

## 2015-09-26 DIAGNOSIS — Z9889 Other specified postprocedural states: Secondary | ICD-10-CM | POA: Diagnosis not present

## 2015-09-29 ENCOUNTER — Ambulatory Visit (INDEPENDENT_AMBULATORY_CARE_PROVIDER_SITE_OTHER): Payer: Medicare HMO | Admitting: Family Medicine

## 2015-09-29 ENCOUNTER — Encounter: Payer: Self-pay | Admitting: Family Medicine

## 2015-09-29 VITALS — BP 120/70 | HR 72 | Ht 64.0 in | Wt 165.0 lb

## 2015-09-29 DIAGNOSIS — N3090 Cystitis, unspecified without hematuria: Secondary | ICD-10-CM | POA: Diagnosis not present

## 2015-09-29 LAB — POCT URINALYSIS DIPSTICK
Bilirubin, UA: NEGATIVE
GLUCOSE UA: NEGATIVE
Ketones, UA: NEGATIVE
NITRITE UA: POSITIVE
PH UA: 5
PROTEIN UA: NEGATIVE
SPEC GRAV UA: 1.02
UROBILINOGEN UA: 0.2

## 2015-09-29 MED ORDER — SULFAMETHOXAZOLE-TRIMETHOPRIM 800-160 MG PO TABS: 1.0000 | ORAL_TABLET | Freq: Two times a day (BID) | ORAL | 0 refills | Status: DC

## 2015-09-29 NOTE — Progress Notes (Signed)
Name: Alexandra Watson   MRN: [id number]    DOB: [birth year]   Date:09/29/2015       Progress Note  Subjective  Chief Complaint  Chief Complaint  Patient presents with  . Urinary Tract Infection    Urinary Tract Infection   This is a new problem. The current episode started in the past 7 days. The problem occurs every urination. The problem has been gradually worsening. The quality of the pain is described as burning. The pain is mild. There has been no fever. Associated symptoms include chills, frequency and urgency. Pertinent negatives include no discharge, flank pain, hematuria, hesitancy, nausea, sweats or vomiting. The treatment provided mild relief.    No problem-specific Assessment & Plan notes found for this encounter.   Past Medical History:  Diagnosis Date  . GERD (gastroesophageal reflux disease)   . Hyperlipidemia   . Hypertension   . Lung cancer ([place]) 09/2011   left lung broncial ca    Past Surgical History:  Procedure Laterality Date  . BUNIONECTOMY Bilateral 2001, 2005  . CHOLECYSTECTOMY    . COLONOSCOPY  05/2010   normal  . GANGLION CYST EXCISION Left   . VATS Sleeve lobectomy Left 09/2011   typical variant carcinoid    Family History  Problem Relation Age of Onset  . CAD Brother   . CAD Father   . Stroke Father   . Stroke Mother     Social History   Social History  . Marital status: Married    Spouse name: N/A  . Number of children: N/A  . Years of education: N/A   Occupational History  . Not on file.   Social History Main Topics  . Smoking status: Former Research scientist (life sciences)  . Smokeless tobacco: Not on file  . Alcohol use 1.2 oz/week    2 Standard drinks or equivalent per week  . Drug use: Unknown  . Sexual activity: Not on file   Other Topics Concern  . Not on file   Social History Narrative  . No narrative on file    Allergies  Allergen Reactions  . Iodinated Diagnostic Agents Hives     Review of Systems  Constitutional: Positive for  chills. Negative for fever, malaise/fatigue and weight loss.  HENT: Negative for ear discharge, ear pain and sore throat.   Eyes: Negative for blurred vision.  Respiratory: Negative for cough, sputum production, shortness of breath and wheezing.   Cardiovascular: Negative for chest pain, palpitations and leg swelling.  Gastrointestinal: Negative for abdominal pain, blood in stool, constipation, diarrhea, heartburn, melena, nausea and vomiting.  Genitourinary: Positive for frequency and urgency. Negative for dysuria, flank pain, hematuria and hesitancy.  Musculoskeletal: Negative for back pain, joint pain, myalgias and neck pain.  Skin: Negative for rash.  Neurological: Negative for dizziness, tingling, sensory change, focal weakness and headaches.  Endo/Heme/Allergies: Negative for environmental allergies and polydipsia. Does not bruise/bleed easily.  Psychiatric/Behavioral: Negative for depression and suicidal ideas. The patient is not nervous/anxious and does not have insomnia.      Objective  Vitals:   09/29/15 1551  BP: 120/70  Pulse: 72  Weight: 165 lb (74.8 kg)  Height: '5\' 4"'$  (1.626 m)    Physical Exam  Constitutional: She is well-developed, well-nourished, and in no distress. No distress.  HENT:  Head: Normocephalic and atraumatic.  Right Ear: External ear normal.  Left Ear: External ear normal.  Nose: Nose normal.  Mouth/Throat: Oropharynx is clear and moist.  Eyes: Conjunctivae and EOM are  normal. Pupils are equal, round, and reactive to light. Right eye exhibits no discharge. Left eye exhibits no discharge.  Neck: Normal range of motion. Neck supple. No JVD present. No thyromegaly present.  Cardiovascular: Normal rate, regular rhythm, normal heart sounds and intact distal pulses.  Exam reveals no gallop and no friction rub.   No murmur heard. Pulmonary/Chest: Effort normal and breath sounds normal.  Abdominal: Soft. Bowel sounds are normal. She exhibits no mass. There  is no tenderness. There is no guarding.  Musculoskeletal: Normal range of motion. She exhibits no edema.  Lymphadenopathy:    She has no cervical adenopathy.  Neurological: She is alert. She has normal reflexes.  Skin: Skin is warm and dry. She is not diaphoretic.  Psychiatric: Mood and affect normal.  Nursing note and vitals reviewed.     Assessment & Plan  Problem List Items Addressed This Visit    None    Visit Diagnoses    Cystitis    -  Primary   Relevant Medications   sulfamethoxazole-trimethoprim (BACTRIM DS,SEPTRA DS) 800-160 MG tablet        Dr. Tanganika Barradas [facility]  09/29/15

## 2015-09-30 ENCOUNTER — Ambulatory Visit: Payer: Medicare HMO | Admitting: Family Medicine

## 2015-10-04 ENCOUNTER — Telehealth: Payer: Self-pay

## 2015-10-04 NOTE — Telephone Encounter (Signed)
Patient  Called about still having cloudy urine. Finished Abx today. I advised she give Abx few more days to finish working unless developing Dysuria, fever, or confusion patient will call in 2-3 days if still having concerns and sooner if any symptoms.

## 2015-10-06 ENCOUNTER — Other Ambulatory Visit: Payer: Self-pay | Admitting: Internal Medicine

## 2015-10-06 ENCOUNTER — Telehealth: Payer: Self-pay

## 2015-10-06 MED ORDER — CIPROFLOXACIN HCL 250 MG PO TABS: 250.0000 mg | ORAL_TABLET | Freq: Two times a day (BID) | ORAL | 0 refills | Status: DC

## 2015-10-06 NOTE — Telephone Encounter (Signed)
Rx for Cipro sent to pharmacy.  If she is still having symptoms after that, she will need an OV.

## 2015-10-06 NOTE — Telephone Encounter (Signed)
Patient has tried Cipro before but will try again. She will call if no better.

## 2015-10-06 NOTE — Telephone Encounter (Signed)
No UCX done. Having frequancy and wants new Abx for UTI on 09/29/2015.

## 2015-10-11 ENCOUNTER — Telehealth: Payer: Self-pay

## 2015-10-11 NOTE — Telephone Encounter (Signed)
Patient called today and left message stating that she still has cloudy urine and has been on 2 antibiotics. I left message asking her to call tomorrow and make appt.

## 2015-10-12 DIAGNOSIS — E7800 Pure hypercholesterolemia, unspecified: Secondary | ICD-10-CM | POA: Diagnosis not present

## 2015-10-12 DIAGNOSIS — Z0000 Encounter for general adult medical examination without abnormal findings: Secondary | ICD-10-CM | POA: Diagnosis not present

## 2015-10-12 DIAGNOSIS — I10 Essential (primary) hypertension: Secondary | ICD-10-CM | POA: Diagnosis not present

## 2015-10-12 DIAGNOSIS — K219 Gastro-esophageal reflux disease without esophagitis: Secondary | ICD-10-CM | POA: Diagnosis not present

## 2015-10-13 ENCOUNTER — Encounter: Payer: Self-pay | Admitting: Internal Medicine

## 2015-10-13 ENCOUNTER — Ambulatory Visit (INDEPENDENT_AMBULATORY_CARE_PROVIDER_SITE_OTHER): Payer: Medicare HMO | Admitting: Internal Medicine

## 2015-10-13 ENCOUNTER — Other Ambulatory Visit: Payer: Self-pay | Admitting: Internal Medicine

## 2015-10-13 VITALS — BP 120/62 | HR 60 | Ht 63.0 in | Wt 165.0 lb

## 2015-10-13 DIAGNOSIS — N390 Urinary tract infection, site not specified: Secondary | ICD-10-CM

## 2015-10-13 LAB — POC URINALYSIS WITH MICROSCOPIC (NON AUTO)MANUAL RESULT
Bilirubin, UA: NEGATIVE
Blood, UA: NEGATIVE
CRYSTALS: 0
EPITHELIAL CELLS, URINE PER MICROSCOPY: 0
Glucose, UA: NEGATIVE
Ketones, UA: NEGATIVE
MUCUS UA: 0
NITRITE UA: POSITIVE
PH UA: 5
PROTEIN UA: NEGATIVE
RBC: 10 M/uL — AB (ref 4.04–5.48)
Spec Grav, UA: <=1.005

## 2015-10-13 MED ORDER — CEFUROXIME AXETIL 500 MG PO TABS: 500.0000 mg | ORAL_TABLET | Freq: Two times a day (BID) | ORAL | 0 refills | Status: DC

## 2015-10-13 NOTE — Progress Notes (Signed)
Date:  10/13/2015   Name:  Alexandra Watson   DOB:  1945/08/09   MRN:  [id number]   Chief Complaint: Urinary Tract Infection ("can't seem to get rid of it") Urinary Tract Infection   This is a recurrent problem. The current episode started 1 to 4 weeks ago. The problem occurs every urination. The problem has been unchanged. Associated symptoms include frequency and urgency. Pertinent negatives include no chills, hematuria, nausea or vomiting.  She was treated with Bactrim initially but continued to have cloudy urine.  Because she had broken her ankle, she could not get in so Cipro was called in.  Still having cloudy urine.   Review of Systems  Constitutional: Negative for chills and fever.  Cardiovascular: Positive for leg swelling. Negative for chest pain and palpitations.  Gastrointestinal: Negative for abdominal pain, diarrhea, nausea and vomiting.  Genitourinary: Positive for dysuria, frequency and urgency. Negative for hematuria.  Musculoskeletal: Positive for arthralgias, gait problem and joint swelling.    Patient Active Problem List   Diagnosis Date Noted  . Fracture of ankle, trimalleolar, left, closed 08/08/2015  . Hematuria 07/01/2015  . Arthritis of knee, left 07/01/2015  . Insomnia 06/30/2015  . Atrophic vaginitis 06/30/2015  . GERD (gastroesophageal reflux disease) 06/30/2015  . Osteopenia 05/20/2015  . Anemia 09/14/2012  . Constipation 08/27/2012  . Arthritis 08/27/2012  . Hypertension 10/04/2011  . Hyperlipidemia, mixed 10/04/2011  . 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 Authorizing Provider  aspirin EC 81 MG tablet Take 81 mg by mouth daily.   Yes Historical Provider, MD  Calcium Carbonate-Vit D-Min (CALCIUM 1200 PO) Take 2,400 mg by mouth.   Yes Historical Provider, MD  CHOLECALCIFEROL PO Take 1 tablet by mouth daily.   Yes Historical Provider, MD  glucosamine-chondroitin 500-400 MG  tablet Take 1 tablet by mouth daily.   Yes Historical Provider, MD  Magnesium Lactate (MAG-TAB SR PO) Take 1 tablet by mouth 3 (three) times a week.   Yes Historical Provider, MD  polyethylene glycol (MIRALAX / GLYCOLAX) packet Take 17 g by mouth daily. 08/24/15  Yes Glean Hess, MD  simvastatin (ZOCOR) 20 MG tablet Take 20 mg by mouth daily.   Yes Historical Provider, MD  hydrochlorothiazide (HYDRODIURIL) 25 MG tablet Take 1 tablet by mouth daily. 02/25/15 09/29/15  Historical Provider, MD  metoprolol (LOPRESSOR) 50 MG tablet Take 1 tablet by mouth daily. 02/25/15 09/29/15  Historical Provider, MD  ranitidine (ZANTAC) 150 MG tablet Take 1 tablet by mouth 2 (two) times daily. 09/14/14 09/29/15  Historical Provider, MD    Allergies  Allergen Reactions  . Iodinated Diagnostic Agents Hives    Past Surgical History:  Procedure Laterality Date  . BUNIONECTOMY Bilateral 2001, 2005  . CHOLECYSTECTOMY    . COLONOSCOPY  05/2010   normal  . GANGLION CYST EXCISION Left   . 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 Smoking status: Former Research scientist (life sciences)  . Smokeless tobacco: Not on file  . Alcohol use 1.2 oz/week    2 Standard drinks or equivalent per week     Medication list has been reviewed and updated.   Physical Exam  Constitutional: She is oriented to person, place, and time. She appears well-developed. No distress.  HENT:  Head: Normocephalic and atraumatic.  Cardiovascular: Normal rate, regular rhythm and normal heart sounds.  Pulmonary/Chest: Effort normal and breath sounds normal. No respiratory distress.  Abdominal: Soft. Normal appearance. There is tenderness in the suprapubic area.  Musculoskeletal: Normal range of motion.       Feet:  Neurological: She is alert and oriented to person, place, and time.  Skin: Skin is warm and dry. No rash noted.  Psychiatric: She has a normal mood and affect. Her behavior is normal.  Thought content normal.  Nursing note and vitals reviewed.   BP 120/62   Pulse 60   Ht '5\' 3"'$  (1.6 m)   Wt 165 lb (74.8 kg)   BMI 29.23 kg/m   Assessment and Plan: 1. Urinary tract infection without hematuria, site unspecified Increase water intake and frequency of micturition - POC urinalysis w microscopic (non auto) - Urine Culture - cefUROXime (CEFTIN) 500 MG tablet; Take 1 tablet (500 mg total) by mouth 2 (two) times daily with a meal.  Dispense: 20 tablet; Refill: 0   Halina Maidens, MD [facility]  10/13/2015

## 2015-10-16 LAB — URINE CULTURE

## 2015-10-17 DIAGNOSIS — S82852A Displaced trimalleolar fracture of left lower leg, initial encounter for closed fracture: Secondary | ICD-10-CM | POA: Diagnosis not present

## 2015-10-17 DIAGNOSIS — M25572 Pain in left ankle and joints of left foot: Secondary | ICD-10-CM | POA: Diagnosis not present

## 2015-10-18 ENCOUNTER — Telehealth: Payer: Self-pay

## 2015-10-18 ENCOUNTER — Encounter: Payer: Self-pay | Admitting: Internal Medicine

## 2015-10-18 ENCOUNTER — Other Ambulatory Visit: Payer: Self-pay | Admitting: Internal Medicine

## 2015-10-18 MED ORDER — NITROFURANTOIN MONOHYD MACRO 100 MG PO CAPS: 100.0000 mg | ORAL_CAPSULE | Freq: Two times a day (BID) | ORAL | 0 refills | Status: DC

## 2015-10-18 NOTE — Telephone Encounter (Signed)
Advised to stop Abx from OV and start ones sent in today.

## 2015-11-01 ENCOUNTER — Other Ambulatory Visit: Payer: Self-pay | Admitting: Internal Medicine

## 2015-11-01 ENCOUNTER — Encounter: Payer: Self-pay | Admitting: Internal Medicine

## 2015-11-01 ENCOUNTER — Telehealth: Payer: Self-pay

## 2015-11-01 ENCOUNTER — Ambulatory Visit (INDEPENDENT_AMBULATORY_CARE_PROVIDER_SITE_OTHER): Payer: Medicare HMO | Admitting: Internal Medicine

## 2015-11-01 VITALS — BP 122/82 | HR 88 | Temp 98.2°F | Resp 16 | Ht 63.0 in | Wt 165.0 lb

## 2015-11-01 DIAGNOSIS — N390 Urinary tract infection, site not specified: Secondary | ICD-10-CM | POA: Diagnosis not present

## 2015-11-01 DIAGNOSIS — R319 Hematuria, unspecified: Secondary | ICD-10-CM

## 2015-11-01 LAB — POCT URINALYSIS DIPSTICK
Bilirubin, UA: NEGATIVE
GLUCOSE UA: NEGATIVE
KETONES UA: NEGATIVE
NITRITE UA: NEGATIVE
PH UA: 6.5
PROTEIN UA: NEGATIVE
SPEC GRAV UA: 1.01

## 2015-11-01 MED ORDER — NITROFURANTOIN MONOHYD MACRO 100 MG PO CAPS: 100.0000 mg | ORAL_CAPSULE | Freq: Two times a day (BID) | ORAL | 0 refills | Status: DC

## 2015-11-01 NOTE — Telephone Encounter (Signed)
Patient called nurse line over weekend. Returned call advised her to be seen in order to determine UTI and ref to Urology.

## 2015-11-01 NOTE — Progress Notes (Signed)
Date:  11/01/2015   Name:  Alexandra Watson   DOB:  November 12, 1945   MRN:  [id number]   Chief Complaint: Urinary Frequency (Cloudy urine -ref to Urology) Urinary Frequency   This is a recurrent problem. The current episode started 1 to 4 weeks ago. The problem occurs every urination. Associated symptoms include frequency. Pertinent negatives include no chills or hematuria. Associated symptoms comments: Suprapubic pressure. She has tried antibiotics for the symptoms. The treatment provided moderate (but now increasing again) relief. Her past medical history is significant for recurrent UTIs.  Last culture showed Escherichia coli sensitive to nitrofurantoin. She completed a ten-day course and did feel improved. However 2 days after finishing the antibiotic she noticed that her urine was cloudy and she was having more pressure in the suprapubic area. She denies fever chills nausea vomiting or hematuria.    Review of Systems  Constitutional: Negative for chills, diaphoresis and fever.  Respiratory: Negative for chest tightness and shortness of breath.   Cardiovascular: Negative for chest pain and palpitations.  Gastrointestinal: Negative for abdominal pain and diarrhea.  Genitourinary: Positive for frequency. Negative for dyspareunia, dysuria, hematuria and pelvic pain.  Musculoskeletal: Positive for arthralgias.  Skin: Negative for rash.    Patient Active Problem List   Diagnosis Date Noted  . Fracture of ankle, closed 08/16/2015  . Fracture of ankle, trimalleolar, left, closed 08/08/2015  . Hematuria 07/01/2015  . Arthritis of knee, left 07/01/2015  . Insomnia 06/30/2015  . Atrophic vaginitis 06/30/2015  . GERD (gastroesophageal reflux disease) 06/30/2015  . Osteopenia 05/20/2015  . Anemia 09/14/2012  . Constipation 08/27/2012  . Arthritis 08/27/2012  . Hypertension 10/04/2011  . Hyperlipidemia, mixed 10/04/2011  . 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 Authorizing Provider  aspirin EC 81 MG tablet Take 81 mg by mouth daily.   Yes Historical Provider, MD  Calcium Carbonate-Vit D-Min (CALCIUM 1200 PO) Take 2,400 mg by mouth.   Yes Historical Provider, MD  CHOLECALCIFEROL PO Take 1 tablet by mouth daily.   Yes Historical Provider, MD  gabapentin (NEURONTIN) 100 MG capsule TAKE 1 CAPSULE BY MOUTH TODAY, 2 CAPSULES TOMORROW, THEN 3 CAPSULES DAILY 10/24/15  Yes Historical Provider, MD  glucosamine-chondroitin 500-400 MG tablet Take 1 tablet by mouth daily.   Yes Historical Provider, MD  Magnesium Lactate (MAG-TAB SR PO) Take 1 tablet by mouth 3 (three) times a week.   Yes Historical Provider, MD  polyethylene glycol (MIRALAX / GLYCOLAX) packet Take 17 g by mouth daily. 08/24/15  Yes Glean Hess, MD  simvastatin (ZOCOR) 20 MG tablet Take 20 mg by mouth daily.   Yes Historical Provider, MD  hydrochlorothiazide (HYDRODIURIL) 25 MG tablet Take 1 tablet by mouth daily. 02/25/15 09/29/15  Historical Provider, MD  metoprolol (LOPRESSOR) 50 MG tablet Take 1 tablet by mouth daily. 02/25/15 09/29/15  Historical Provider, MD  ranitidine (ZANTAC) 150 MG tablet Take 1 tablet by mouth 2 (two) times daily. 09/14/14 09/29/15  Historical Provider, MD    Allergies  Allergen Reactions  . Iodinated Diagnostic Agents Hives    Past Surgical History:  Procedure Laterality Date  . ANKLE ARTHROSCOPY WITH OPEN REDUCTION INTERNAL FIXATION (ORIF)  07/2015   tri-malleolar  . BUNIONECTOMY Bilateral 2001, 2005  . CHOLECYSTECTOMY    . COLONOSCOPY  05/2010   normal  . GANGLION CYST EXCISION Left   . 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 Smoking status: Former Research scientist (life sciences)  . Smokeless tobacco: Never Used  . Alcohol use 1.2 oz/week    2 Standard drinks or equivalent per week     Medication list has been reviewed and updated.   Physical Exam  Constitutional:  She is oriented to person, place, and time. She appears well-developed. No distress.  HENT:  Head: Normocephalic and atraumatic.  Neck: Normal range of motion. Neck supple.  Cardiovascular: Normal rate, regular rhythm and normal heart sounds.   Pulmonary/Chest: Effort normal and breath sounds normal. No respiratory distress. She has no wheezes.  Abdominal: Soft. Bowel sounds are normal. She exhibits no distension and no mass. There is tenderness. There is no rebound and no guarding.  Musculoskeletal: Normal range of motion.  Neurological: She is alert and oriented to person, place, and time.  Skin: Skin is warm and dry. No rash noted.  Psychiatric: She has a normal mood and affect. Her behavior is normal. Thought content normal.  Nursing note and vitals reviewed.   BP 122/82 (BP Location: Right Arm, Patient Position: Sitting, Cuff Size: Normal)   Pulse 88   Temp 98.2 F (36.8 C)   Resp 16   Ht '5\' 3"'$  (1.6 m)   Wt 165 lb (74.8 kg)   BMI 29.23 kg/m   Assessment and Plan: 1. Urinary tract infection with hematuria, site unspecified Recheck culture Start macrobid - adjust if needed - Urine culture - POCT urinalysis dipstick - Ambulatory referral to Urology   Halina Maidens, MD [facility]  11/01/2015

## 2015-11-03 ENCOUNTER — Telehealth: Payer: Self-pay

## 2015-11-03 ENCOUNTER — Ambulatory Visit: Payer: Medicare HMO | Admitting: Internal Medicine

## 2015-11-03 LAB — URINE CULTURE

## 2015-11-03 NOTE — Telephone Encounter (Signed)
Patient states you never called her back and wants to know if she should take the Ceiba or are you going to call in the [facility] she told about? I explained we thought it was an FYI call and that we may not have culture back.

## 2015-11-07 DIAGNOSIS — S82852D Displaced trimalleolar fracture of left lower leg, subsequent encounter for closed fracture with routine healing: Secondary | ICD-10-CM | POA: Diagnosis not present

## 2015-11-07 DIAGNOSIS — Y33XXXD Other specified events, undetermined intent, subsequent encounter: Secondary | ICD-10-CM | POA: Diagnosis not present

## 2015-11-08 DIAGNOSIS — S82852D Displaced trimalleolar fracture of left lower leg, subsequent encounter for closed fracture with routine healing: Secondary | ICD-10-CM | POA: Diagnosis not present

## 2015-11-08 DIAGNOSIS — M25572 Pain in left ankle and joints of left foot: Secondary | ICD-10-CM | POA: Diagnosis not present

## 2015-11-10 ENCOUNTER — Encounter: Payer: Self-pay | Admitting: Internal Medicine

## 2015-11-10 ENCOUNTER — Ambulatory Visit (INDEPENDENT_AMBULATORY_CARE_PROVIDER_SITE_OTHER): Payer: Medicare HMO | Admitting: Internal Medicine

## 2015-11-10 VITALS — BP 122/80 | HR 72 | Resp 16 | Ht 63.0 in | Wt 165.0 lb

## 2015-11-10 DIAGNOSIS — I10 Essential (primary) hypertension: Secondary | ICD-10-CM

## 2015-11-10 DIAGNOSIS — N390 Urinary tract infection, site not specified: Secondary | ICD-10-CM | POA: Diagnosis not present

## 2015-11-10 DIAGNOSIS — S82852D Displaced trimalleolar fracture of left lower leg, subsequent encounter for closed fracture with routine healing: Secondary | ICD-10-CM

## 2015-11-10 NOTE — Progress Notes (Signed)
Date:  11/10/2015   Name:  Alexandra Watson   DOB:  1945-03-08   MRN:  [id number]   Chief Complaint: Hypertension Hypertension  Pertinent negatives include no chest pain, palpitations or shortness of breath.  Urinary Tract Infection   This is a recurrent problem. Pertinent negatives include no chills, frequency or urgency.  Culture showed mixed urogenital flora.  She did not start macrobid.  She has appt with urology next week.    Review of Systems  Constitutional: Negative for chills, fatigue and fever.  Respiratory: Negative for choking, chest tightness and shortness of breath.   Cardiovascular: Negative for chest pain, palpitations and leg swelling.  Genitourinary: Positive for dysuria. Negative for frequency, pelvic pain and urgency.  Musculoskeletal: Positive for arthralgias. Negative for gait problem and joint swelling.  Skin: Negative for rash.    Patient Active Problem List   Diagnosis Date Noted  . Fracture of ankle, closed 08/16/2015  . Fracture of ankle, trimalleolar, left, closed 08/08/2015  . Hematuria 07/01/2015  . Arthritis of knee, left 07/01/2015  . Insomnia 06/30/2015  . Atrophic vaginitis 06/30/2015  . GERD (gastroesophageal reflux disease) 06/30/2015  . Osteopenia 05/20/2015  . Anemia 09/14/2012  . Constipation 08/27/2012  . Arthritis 08/27/2012  . Hypertension 10/04/2011  . Hyperlipidemia, mixed 10/04/2011  . 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 Authorizing Provider  acetaminophen-codeine (TYLENOL #3) 300-30 MG tablet Take by mouth. 11/07/15  Yes Historical Provider, MD  aspirin EC 81 MG tablet Take 81 mg by mouth daily.   Yes Historical Provider, MD  Calcium Carbonate-Vit D-Min (CALCIUM 1200 PO) Take 2,400 mg by mouth.   Yes Historical Provider, MD  CHOLECALCIFEROL PO Take 1 tablet by mouth daily.   Yes Historical Provider, MD  gabapentin (NEURONTIN) 100 MG capsule TAKE 1  CAPSULE BY MOUTH TODAY, 2 CAPSULES TOMORROW, THEN 3 CAPSULES DAILY 10/24/15  Yes Historical Provider, MD  glucosamine-chondroitin 500-400 MG tablet Take 1 tablet by mouth daily.   Yes Historical Provider, MD  HYDROcodone-acetaminophen (NORCO/VICODIN) 5-325 MG tablet Take by mouth. 11/07/15 12/02/15 Yes Historical Provider, MD  Magnesium Lactate (MAG-TAB SR PO) Take 1 tablet by mouth 3 (three) times a week.   Yes Historical Provider, MD  polyethylene glycol (MIRALAX / GLYCOLAX) packet Take 17 g by mouth daily. 08/24/15  Yes Glean Hess, MD  simvastatin (ZOCOR) 20 MG tablet Take 20 mg by mouth daily.   Yes Historical Provider, MD  hydrochlorothiazide (HYDRODIURIL) 25 MG tablet Take 1 tablet by mouth daily. 02/25/15 09/29/15  Historical Provider, MD  metoprolol (LOPRESSOR) 50 MG tablet Take 1 tablet by mouth daily. 02/25/15 09/29/15  Historical Provider, MD  ranitidine (ZANTAC) 150 MG tablet Take 1 tablet by mouth 2 (two) times daily. 09/14/14 09/29/15  Historical Provider, MD    Allergies  Allergen Reactions  . Iodinated Diagnostic Agents Hives    Past Surgical History:  Procedure Laterality Date  . ANKLE ARTHROSCOPY WITH OPEN REDUCTION INTERNAL FIXATION (ORIF)  07/2015   tri-malleolar  . BUNIONECTOMY Bilateral 2001, 2005  . CHOLECYSTECTOMY    . COLONOSCOPY  05/2010   normal  . GANGLION CYST EXCISION Left   . 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 Smoking status: Former Research scientist (life sciences)  . Smokeless tobacco: Never Used  . Alcohol use 1.2 oz/week    2 Standard drinks or equivalent  per week     Medication list has been reviewed and updated.   Physical Exam  Constitutional: She is oriented to person, place, and time. She appears well-developed. No distress.  HENT:  Head: Normocephalic and atraumatic.  Neck: Normal range of motion. Neck supple.  Cardiovascular: Normal rate, regular rhythm and normal heart sounds.     Pulmonary/Chest: Effort normal and breath sounds normal. No respiratory distress. She has no wheezes.  Neurological: She is alert and oriented to person, place, and time.  Skin: Skin is warm and dry. No rash noted.  Psychiatric: She has a normal mood and affect. Her behavior is normal. Thought content normal.  Nursing note and vitals reviewed.   BP 122/80   Pulse 72   Resp 16   Ht '5\' 3"'$  (1.6 m)   Wt 165 lb (74.8 kg)   SpO2 100%   BMI 29.23 kg/m   Assessment and Plan: 1. Essential hypertension controlled - CBC with Differential/Platelet - Comprehensive metabolic panel - TSH  2. Fracture of ankle, trimalleolar, left, closed, with routine healing, subsequent encounter healing  3. Recurrent UTI See Urology next week   Halina Maidens, MD [facility]  11/10/2015

## 2015-11-10 NOTE — Patient Instructions (Signed)
DASH Eating Plan  DASH stands for "Dietary Approaches to Stop Hypertension." The DASH eating plan is a healthy eating plan that has been shown to reduce high blood pressure (hypertension). Additional health benefits may include reducing the risk of type 2 diabetes mellitus, heart disease, and stroke. The DASH eating plan may also help with weight loss.  WHAT DO I NEED TO KNOW ABOUT THE DASH EATING PLAN?  For the DASH eating plan, you will follow these general guidelines:  · Choose foods with a percent daily value for sodium of less than 5% (as listed on the food label).  · Use salt-free seasonings or herbs instead of table salt or sea salt.  · Check with your health care provider or pharmacist before using salt substitutes.  · Eat lower-sodium products, often labeled as "lower sodium" or "no salt added."  · Eat fresh foods.  · Eat more vegetables, fruits, and low-fat dairy products.  · Choose whole grains. Look for the word "whole" as the first word in the ingredient list.  · Choose fish and skinless chicken or turkey more often than red meat. Limit fish, poultry, and meat to 6 oz (170 g) each day.  · Limit sweets, desserts, sugars, and sugary drinks.  · Choose heart-healthy fats.  · Limit cheese to 1 oz (28 g) per day.  · Eat more home-cooked food and less restaurant, buffet, and fast food.  · Limit fried foods.  · Cook foods using methods other than frying.  · Limit canned vegetables. If you do use them, rinse them well to decrease the sodium.  · When eating at a restaurant, ask that your food be prepared with less salt, or no salt if possible.  WHAT FOODS CAN I EAT?  Seek help from a dietitian for individual calorie needs.  Grains  Whole grain or whole wheat bread. Brown rice. Whole grain or whole wheat pasta. Quinoa, bulgur, and whole grain cereals. Low-sodium cereals. Corn or whole wheat flour tortillas. Whole grain cornbread. Whole grain crackers. Low-sodium crackers.  Vegetables  Fresh or frozen vegetables  (raw, steamed, roasted, or grilled). Low-sodium or reduced-sodium tomato and vegetable juices. Low-sodium or reduced-sodium tomato sauce and paste. Low-sodium or reduced-sodium canned vegetables.   Fruits  All fresh, canned (in natural juice), or frozen fruits.  Meat and Other Protein Products  Ground beef (85% or leaner), grass-fed beef, or beef trimmed of fat. Skinless chicken or turkey. Ground chicken or turkey. Pork trimmed of fat. All fish and seafood. Eggs. Dried beans, peas, or lentils. Unsalted nuts and seeds. Unsalted canned beans.  Dairy  Low-fat dairy products, such as skim or 1% milk, 2% or reduced-fat cheeses, low-fat ricotta or cottage cheese, or plain low-fat yogurt. Low-sodium or reduced-sodium cheeses.  Fats and Oils  Tub margarines without trans fats. Light or reduced-fat mayonnaise and salad dressings (reduced sodium). Avocado. Safflower, olive, or canola oils. Natural peanut or almond butter.  Other  Unsalted popcorn and pretzels.  The items listed above may not be a complete list of recommended foods or beverages. Contact your dietitian for more options.  WHAT FOODS ARE NOT RECOMMENDED?  Grains  White bread. White pasta. White rice. Refined cornbread. Bagels and croissants. Crackers that contain trans fat.  Vegetables  Creamed or fried vegetables. Vegetables in a cheese sauce. Regular canned vegetables. Regular canned tomato sauce and paste. Regular tomato and vegetable juices.  Fruits  Dried fruits. Canned fruit in light or heavy syrup. Fruit juice.  Meat and Other Protein   Products  Fatty cuts of meat. Ribs, chicken wings, bacon, sausage, bologna, salami, chitterlings, fatback, hot dogs, bratwurst, and packaged luncheon meats. Salted nuts and seeds. Canned beans with salt.  Dairy  Whole or 2% milk, cream, half-and-half, and cream cheese. Whole-fat or sweetened yogurt. Full-fat cheeses or blue cheese. Nondairy creamers and whipped toppings. Processed cheese, cheese spreads, or cheese  curds.  Condiments  Onion and garlic salt, seasoned salt, table salt, and sea salt. Canned and packaged gravies. Worcestershire sauce. Tartar sauce. Barbecue sauce. Teriyaki sauce. Soy sauce, including reduced sodium. Steak sauce. Fish sauce. Oyster sauce. Cocktail sauce. Horseradish. Ketchup and mustard. Meat flavorings and tenderizers. Bouillon cubes. Hot sauce. Tabasco sauce. Marinades. Taco seasonings. Relishes.  Fats and Oils  Butter, stick margarine, lard, shortening, ghee, and bacon fat. Coconut, palm kernel, or palm oils. Regular salad dressings.  Other  Pickles and olives. Salted popcorn and pretzels.  The items listed above may not be a complete list of foods and beverages to avoid. Contact your dietitian for more information.  WHERE CAN I FIND MORE INFORMATION?  National Heart, [employer], [employer] Institute: www.nhlbi.nih.gov/health/health-topics/topics/dash/     This information is not intended to replace advice given to you by your health care provider. Make sure you discuss any questions you have with your health care provider.     Document Released: 02/01/2011 Document Revised: 03/05/2014 Document Reviewed: 12/17/2012  Elsevier Interactive Patient Education ©2016 [employer]

## 2015-11-11 DIAGNOSIS — M25572 Pain in left ankle and joints of left foot: Secondary | ICD-10-CM | POA: Diagnosis not present

## 2015-11-11 DIAGNOSIS — R339 Retention of urine, unspecified: Secondary | ICD-10-CM | POA: Insufficient documentation

## 2015-11-11 DIAGNOSIS — S82852D Displaced trimalleolar fracture of left lower leg, subsequent encounter for closed fracture with routine healing: Secondary | ICD-10-CM | POA: Diagnosis not present

## 2015-11-11 DIAGNOSIS — N3090 Cystitis, unspecified without hematuria: Secondary | ICD-10-CM | POA: Insufficient documentation

## 2015-11-11 DIAGNOSIS — N3020 Other chronic cystitis without hematuria: Secondary | ICD-10-CM | POA: Diagnosis not present

## 2015-11-11 DIAGNOSIS — N393 Stress incontinence (female) (male): Secondary | ICD-10-CM | POA: Diagnosis not present

## 2015-11-11 LAB — COMPREHENSIVE METABOLIC PANEL
A/G RATIO: 2 (ref 1.2–2.2)
ALT: 15 IU/L (ref 0–32)
AST: 16 IU/L (ref 0–40)
Albumin: 4.7 g/dL (ref 3.5–4.8)
Alkaline Phosphatase: 67 IU/L (ref 39–117)
BILIRUBIN TOTAL: 0.3 mg/dL (ref 0.0–1.2)
BUN/Creatinine Ratio: 15 (ref 12–28)
BUN: 12 mg/dL (ref 8–27)
CALCIUM: 9.9 mg/dL (ref 8.7–10.3)
CHLORIDE: 93 mmol/L — AB (ref 96–106)
CO2: 27 mmol/L (ref 18–29)
Creatinine, Ser: 0.81 mg/dL (ref 0.57–1.00)
GFR calc Af Amer: 85 mL/min/{1.73_m2} (ref >59)
GFR calc non Af Amer: 73 mL/min/{1.73_m2} (ref >59)
GLUCOSE: 91 mg/dL (ref 65–99)
Globulin, Total: 2.3 g/dL (ref 1.5–4.5)
POTASSIUM: 4.2 mmol/L (ref 3.5–5.2)
Sodium: 137 mmol/L (ref 134–144)
Total Protein: 7 g/dL (ref 6.0–8.5)

## 2015-11-11 LAB — CBC WITH DIFFERENTIAL/PLATELET
BASOS: 1 %
Basophils Absolute: 0 10*3/uL (ref 0.0–0.2)
EOS (ABSOLUTE): 0.1 10*3/uL (ref 0.0–0.4)
Eos: 1 %
Hematocrit: 39.6 % (ref 34.0–46.6)
Hemoglobin: 12.9 g/dL (ref 11.1–15.9)
IMMATURE GRANS (ABS): 0 10*3/uL (ref 0.0–0.1)
IMMATURE GRANULOCYTES: 0 %
LYMPHS: 26 %
Lymphocytes Absolute: 1.7 10*3/uL (ref 0.7–3.1)
MCH: 28.5 pg (ref 26.6–33.0)
MCHC: 32.6 g/dL (ref 31.5–35.7)
MCV: 88 fL (ref 79–97)
MONOS ABS: 0.5 10*3/uL (ref 0.1–0.9)
Monocytes: 8 %
NEUTROS PCT: 64 %
Neutrophils Absolute: 4.1 10*3/uL (ref 1.4–7.0)
PLATELETS: 373 10*3/uL (ref 150–379)
RBC: 4.52 x10E6/uL (ref 3.77–5.28)
RDW: 14.2 % (ref 12.3–15.4)
WBC: 6.4 10*3/uL (ref 3.4–10.8)

## 2015-11-11 LAB — TSH: TSH: 1.14 u[IU]/mL (ref 0.450–4.500)

## 2015-11-15 DIAGNOSIS — M25572 Pain in left ankle and joints of left foot: Secondary | ICD-10-CM | POA: Diagnosis not present

## 2015-11-15 DIAGNOSIS — S82852D Displaced trimalleolar fracture of left lower leg, subsequent encounter for closed fracture with routine healing: Secondary | ICD-10-CM | POA: Diagnosis not present

## 2015-11-17 DIAGNOSIS — M25572 Pain in left ankle and joints of left foot: Secondary | ICD-10-CM | POA: Diagnosis not present

## 2015-11-17 DIAGNOSIS — S82852A Displaced trimalleolar fracture of left lower leg, initial encounter for closed fracture: Secondary | ICD-10-CM | POA: Diagnosis not present

## 2015-11-18 DIAGNOSIS — M25562 Pain in left knee: Secondary | ICD-10-CM | POA: Diagnosis not present

## 2015-11-18 DIAGNOSIS — M25572 Pain in left ankle and joints of left foot: Secondary | ICD-10-CM | POA: Diagnosis not present

## 2015-11-18 DIAGNOSIS — M1712 Unilateral primary osteoarthritis, left knee: Secondary | ICD-10-CM | POA: Diagnosis not present

## 2015-11-18 DIAGNOSIS — S82852D Displaced trimalleolar fracture of left lower leg, subsequent encounter for closed fracture with routine healing: Secondary | ICD-10-CM | POA: Diagnosis not present

## 2015-11-24 DIAGNOSIS — Z7982 Long term (current) use of aspirin: Secondary | ICD-10-CM | POA: Diagnosis not present

## 2015-11-24 DIAGNOSIS — D3A00 Benign carcinoid tumor of unspecified site: Secondary | ICD-10-CM | POA: Diagnosis not present

## 2015-11-24 DIAGNOSIS — Z86012 Personal history of benign carcinoid tumor: Secondary | ICD-10-CM | POA: Diagnosis not present

## 2015-11-24 DIAGNOSIS — R918 Other nonspecific abnormal finding of lung field: Secondary | ICD-10-CM | POA: Diagnosis not present

## 2015-11-24 DIAGNOSIS — M25562 Pain in left knee: Secondary | ICD-10-CM | POA: Diagnosis not present

## 2015-11-24 DIAGNOSIS — Z09 Encounter for follow-up examination after completed treatment for conditions other than malignant neoplasm: Secondary | ICD-10-CM | POA: Diagnosis not present

## 2015-11-24 DIAGNOSIS — Z23 Encounter for immunization: Secondary | ICD-10-CM | POA: Diagnosis not present

## 2015-11-24 DIAGNOSIS — Z87891 Personal history of nicotine dependence: Secondary | ICD-10-CM | POA: Diagnosis not present

## 2015-11-24 DIAGNOSIS — M25572 Pain in left ankle and joints of left foot: Secondary | ICD-10-CM | POA: Diagnosis not present

## 2015-11-24 DIAGNOSIS — Z79899 Other long term (current) drug therapy: Secondary | ICD-10-CM | POA: Diagnosis not present

## 2015-11-24 DIAGNOSIS — C3490 Malignant neoplasm of unspecified part of unspecified bronchus or lung: Secondary | ICD-10-CM | POA: Diagnosis not present

## 2015-11-28 DIAGNOSIS — Y33XXXD Other specified events, undetermined intent, subsequent encounter: Secondary | ICD-10-CM | POA: Diagnosis not present

## 2015-11-28 DIAGNOSIS — S82852D Displaced trimalleolar fracture of left lower leg, subsequent encounter for closed fracture with routine healing: Secondary | ICD-10-CM | POA: Diagnosis not present

## 2015-12-08 DIAGNOSIS — D252 Subserosal leiomyoma of uterus: Secondary | ICD-10-CM | POA: Diagnosis not present

## 2015-12-08 DIAGNOSIS — R9341 Abnormal radiologic findings on diagnostic imaging of renal pelvis, ureter, or bladder: Secondary | ICD-10-CM | POA: Diagnosis not present

## 2015-12-08 DIAGNOSIS — N3020 Other chronic cystitis without hematuria: Secondary | ICD-10-CM | POA: Diagnosis not present

## 2015-12-14 DIAGNOSIS — Z1283 Encounter for screening for malignant neoplasm of skin: Secondary | ICD-10-CM | POA: Diagnosis not present

## 2015-12-14 DIAGNOSIS — L720 Epidermal cyst: Secondary | ICD-10-CM | POA: Diagnosis not present

## 2015-12-14 DIAGNOSIS — R238 Other skin changes: Secondary | ICD-10-CM | POA: Diagnosis not present

## 2015-12-14 DIAGNOSIS — Z08 Encounter for follow-up examination after completed treatment for malignant neoplasm: Secondary | ICD-10-CM | POA: Diagnosis not present

## 2015-12-14 DIAGNOSIS — Z789 Other specified health status: Secondary | ICD-10-CM | POA: Diagnosis not present

## 2015-12-14 DIAGNOSIS — L538 Other specified erythematous conditions: Secondary | ICD-10-CM | POA: Diagnosis not present

## 2015-12-14 DIAGNOSIS — L814 Other melanin hyperpigmentation: Secondary | ICD-10-CM | POA: Diagnosis not present

## 2015-12-14 DIAGNOSIS — L821 Other seborrheic keratosis: Secondary | ICD-10-CM | POA: Diagnosis not present

## 2015-12-14 DIAGNOSIS — B078 Other viral warts: Secondary | ICD-10-CM | POA: Diagnosis not present

## 2015-12-14 DIAGNOSIS — Z85828 Personal history of other malignant neoplasm of skin: Secondary | ICD-10-CM | POA: Diagnosis not present

## 2016-01-17 DIAGNOSIS — H524 Presbyopia: Secondary | ICD-10-CM | POA: Diagnosis not present

## 2016-02-06 ENCOUNTER — Encounter: Payer: Self-pay | Admitting: *Deleted

## 2016-02-06 ENCOUNTER — Other Ambulatory Visit: Payer: Self-pay | Admitting: *Deleted

## 2016-02-06 ENCOUNTER — Encounter: Payer: Self-pay | Admitting: Internal Medicine

## 2016-02-06 ENCOUNTER — Ambulatory Visit (INDEPENDENT_AMBULATORY_CARE_PROVIDER_SITE_OTHER): Payer: Medicare HMO | Admitting: Internal Medicine

## 2016-02-06 VITALS — BP 132/78 | HR 66 | Temp 98.3°F | Ht 63.0 in | Wt 157.0 lb

## 2016-02-06 DIAGNOSIS — I10 Essential (primary) hypertension: Secondary | ICD-10-CM | POA: Diagnosis not present

## 2016-02-06 DIAGNOSIS — J40 Bronchitis, not specified as acute or chronic: Secondary | ICD-10-CM | POA: Diagnosis not present

## 2016-02-06 MED ORDER — GUAIFENESIN-CODEINE 100-10 MG/5ML PO SYRP: 5.0000 mL | ORAL_SOLUTION | Freq: Three times a day (TID) | ORAL | 0 refills | Status: DC | PRN

## 2016-02-06 MED ORDER — AZITHROMYCIN 250 MG PO TABS: ORAL_TABLET | ORAL | 0 refills | Status: DC

## 2016-02-06 NOTE — Progress Notes (Signed)
Date:  02/06/2016   Name:  Alexandra Watson   DOB:  17-Apr-1945   MRN:  [id number]   Chief Complaint: Cough (Pt stated been coughing for 2 weeks.) Cough  This is a new problem. The current episode started 1 to 4 weeks ago. The problem has been unchanged. The problem occurs every few minutes. The cough is productive of sputum. Associated symptoms include ear pain, nasal congestion, postnasal drip, shortness of breath and sweats. Pertinent negatives include no chest pain, ear congestion or fever. The symptoms are aggravated by other. She has tried OTC cough suppressant for the symptoms. The treatment provided mild relief.    Review of Systems  Constitutional: Negative for diaphoresis, fatigue and fever.  HENT: Positive for ear pain and postnasal drip.   Respiratory: Positive for cough and shortness of breath.   Cardiovascular: Negative for chest pain, palpitations and leg swelling.  Gastrointestinal: Negative for abdominal pain.    Patient Active Problem List   Diagnosis Date Noted  . SUI (stress urinary incontinence, female) 11/11/2015  . Incomplete emptying of bladder 11/11/2015  . Chronic cystitis 11/11/2015  . Recurrent UTI 11/10/2015  . Fracture of ankle, closed 08/16/2015  . Fracture of ankle, trimalleolar, left, closed 08/08/2015  . Hematuria 07/01/2015  . Arthritis of knee, left 07/01/2015  . Insomnia 06/30/2015  . Atrophic vaginitis 06/30/2015  . GERD (gastroesophageal reflux disease) 06/30/2015  . Osteopenia 05/20/2015  . Anemia 09/14/2012  . Constipation 08/27/2012  . Arthritis 08/27/2012  . Hypertension 10/04/2011  . Hyperlipidemia, mixed 10/04/2011  . 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 Authorizing Provider  acetaminophen-codeine (TYLENOL #3) 300-30 MG tablet Take by mouth. 11/07/15  Yes Historical Provider, MD  aspirin EC 81 MG tablet Take 81 mg by mouth daily.   Yes Historical Provider, MD   Calcium Carbonate-Vit D-Min (CALCIUM 1200 PO) Take 2,400 mg by mouth.   Yes Historical Provider, MD  CHOLECALCIFEROL PO Take 1 tablet by mouth daily.   Yes Historical Provider, MD  conjugated estrogens (PREMARIN) vaginal cream Place vaginally.   Yes Historical Provider, MD  gabapentin (NEURONTIN) 100 MG capsule TAKE 1 CAPSULE BY MOUTH TODAY, 2 CAPSULES TOMORROW, THEN 3 CAPSULES DAILY 10/24/15  Yes Historical Provider, MD  glucosamine-chondroitin 500-400 MG tablet Take 1 tablet by mouth daily.   Yes Historical Provider, MD  hydrochlorothiazide (HYDRODIURIL) 25 MG tablet Take 1 tablet by mouth daily. 02/25/15 02/06/16 Yes Historical Provider, MD  nitrofurantoin, macrocrystal-monohydrate, (MACROBID) 100 MG capsule Take 100 mg by mouth. 11/11/15  Yes Historical Provider, MD  polyethylene glycol (MIRALAX / GLYCOLAX) packet Take 17 g by mouth daily. 08/24/15  Yes Glean Hess, MD  simvastatin (ZOCOR) 20 MG tablet Take 20 mg by mouth daily.   Yes Historical Provider, MD  Cranberry 425 MG CAPS Take by mouth.    Historical Provider, MD  HYDROcodone-acetaminophen (NORCO/VICODIN) 5-325 MG tablet TK 1 T PO QPM PRF PAIN 11/07/15   Historical Provider, MD  Magnesium Lactate (MAG-TAB SR PO) Take 1 tablet by mouth 3 (three) times a week.    Historical Provider, MD  metoprolol (LOPRESSOR) 50 MG tablet Take 1 tablet by mouth daily. 02/25/15 09/29/15  Historical Provider, MD  ranitidine (ZANTAC) 150 MG tablet Take 1 tablet by mouth 2 (two) times daily. 09/14/14 09/29/15  Historical Provider, MD    Allergies  Allergen Reactions  . Iodinated Diagnostic Agents Hives  . Oxycodone Nausea And Vomiting  and Nausea Only    Past Surgical History:  Procedure Laterality Date  . ANKLE ARTHROSCOPY WITH OPEN REDUCTION INTERNAL FIXATION (ORIF)  07/2015   tri-malleolar  . BUNIONECTOMY Bilateral 2001, 2005  . CHOLECYSTECTOMY    . COLONOSCOPY  05/2010   normal  . GANGLION CYST EXCISION Left   . 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 Smoking status: Former Research scientist (life sciences)  . Smokeless tobacco: Never Used  . Alcohol use 1.2 oz/week    2 Standard drinks or equivalent per week     Medication list has been reviewed and updated.   Physical Exam  Constitutional: She is oriented to person, place, and time. She appears well-developed. No distress.  HENT:  Head: Normocephalic and atraumatic.  Cardiovascular: Normal rate, regular rhythm and normal heart sounds.   Pulmonary/Chest: Effort normal. No respiratory distress. She has wheezes. She has rhonchi in the right lower field.  Musculoskeletal: Normal range of motion.  Lymphadenopathy:    She has no cervical adenopathy.  Neurological: She is alert and oriented to person, place, and time.  Skin: Skin is warm and dry. No rash noted.  Psychiatric: She has a normal mood and affect. Her behavior is normal. Thought content normal.  Nursing note and vitals reviewed.   BP 132/78   Pulse 66   Temp 98.3 F (36.8 C)   Ht '5\' 3"'$  (1.6 m)   Wt 157 lb (71.2 kg)   SpO2 97%   BMI 27.81 kg/m   Assessment and Plan: 1. Bronchitis Continue fluids - azithromycin (ZITHROMAX Z-PAK) 250 MG tablet; 2 tabs on day #1 then 1 tab daily for 4 more days  Dispense: 6 each; Refill: 0 - guaiFENesin-codeine (ROBITUSSIN AC) 100-10 MG/5ML syrup; Take 5 mLs by mouth 3 (three) times daily as needed for cough.  Dispense: 150 mL; Refill: 0  2. Essential hypertension Controlled, continue current medications   Halina Maidens, MD [facility]  02/06/2016

## 2016-02-22 ENCOUNTER — Encounter: Payer: Self-pay | Admitting: Internal Medicine

## 2016-02-22 ENCOUNTER — Ambulatory Visit (INDEPENDENT_AMBULATORY_CARE_PROVIDER_SITE_OTHER): Payer: Medicare HMO | Admitting: Internal Medicine

## 2016-02-22 VITALS — BP 126/86 | HR 73 | Temp 97.6°F | Ht 63.5 in | Wt 161.0 lb

## 2016-02-22 DIAGNOSIS — J40 Bronchitis, not specified as acute or chronic: Secondary | ICD-10-CM

## 2016-02-22 MED ORDER — LEVOFLOXACIN 500 MG PO TABS: 500.0000 mg | ORAL_TABLET | Freq: Every day | ORAL | 0 refills | Status: DC

## 2016-02-22 NOTE — Progress Notes (Signed)
Date:  02/22/2016   Name:  Alexandra Watson   DOB:  [birth year]   MRN:  [id number]   Chief Complaint: Cough Was seen her 16 days ago for bronchitis.  Treated with Zpak and felt better but then cough recurred about 5 days ago with green phlegm and cough at night.   Review of Systems  Constitutional: Negative for chills, fatigue and fever.  Respiratory: Positive for cough. Negative for chest tightness, shortness of breath and wheezing.   Cardiovascular: Negative for chest pain and palpitations.    Patient Active Problem List   Diagnosis Date Noted  . SUI (stress urinary incontinence, female) 11/11/2015  . Incomplete emptying of bladder 11/11/2015  . Chronic cystitis 11/11/2015  . Recurrent UTI 11/10/2015  . Fracture of ankle, closed 08/16/2015  . Fracture of ankle, trimalleolar, left, closed 08/08/2015  . Hematuria 07/01/2015  . Arthritis of knee, left 07/01/2015  . Insomnia 06/30/2015  . Atrophic vaginitis 06/30/2015  . GERD (gastroesophageal reflux disease) 06/30/2015  . Osteopenia 05/20/2015  . Anemia 09/14/2012  . Constipation 08/27/2012  . Arthritis 08/27/2012  . Hypertension 10/04/2011  . Hyperlipidemia, mixed 10/04/2011  . 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 Authorizing Provider  acetaminophen-codeine (TYLENOL #3) 300-30 MG tablet Take by mouth. 11/07/15  Yes Historical Provider, MD  aspirin EC 81 MG tablet Take 81 mg by mouth daily.   Yes Historical Provider, MD  Calcium Carbonate-Vit D-Min (CALCIUM 1200 PO) Take 2,400 mg by mouth.   Yes Historical Provider, MD  CHOLECALCIFEROL PO Take 1 tablet by mouth daily.   Yes Historical Provider, MD  gabapentin (NEURONTIN) 100 MG capsule TAKE 1 CAPSULE BY MOUTH TODAY, 2 CAPSULES TOMORROW, THEN 3 CAPSULES DAILY 10/24/15  Yes Historical Provider, MD  hydrochlorothiazide (HYDRODIURIL) 25 MG tablet Take 25 mg by mouth daily.   Yes Historical Provider, MD    metoprolol (LOPRESSOR) 50 MG tablet  01/31/16  Yes Historical Provider, MD  nitrofurantoin, macrocrystal-monohydrate, (MACROBID) 100 MG capsule Take 100 mg by mouth. 11/11/15  Yes Historical Provider, MD  polyethylene glycol (MIRALAX / GLYCOLAX) packet Take 17 g by mouth daily. 08/24/15  Yes Glean Hess, MD  simvastatin (ZOCOR) 20 MG tablet Take 20 mg by mouth daily.   Yes Historical Provider, MD  azithromycin (ZITHROMAX Z-PAK) 250 MG tablet 2 tabs on day #1 then 1 tab daily for 4 more days Patient not taking: Reported on 02/22/2016 02/06/16   Glean Hess, MD  conjugated estrogens (PREMARIN) vaginal cream Place vaginally.    Historical Provider, MD  gabapentin (NEURONTIN) 300 MG capsule  01/31/16   Historical Provider, MD  glucosamine-chondroitin 500-400 MG tablet Take 1 tablet by mouth daily.    Historical Provider, MD  ranitidine (ZANTAC) 150 MG tablet Take 1 tablet by mouth 2 (two) times daily. 09/14/14 09/29/15  Historical Provider, MD    Allergies  Allergen Reactions  . Iodinated Diagnostic Agents Hives  . Oxycodone Nausea And Vomiting and Nausea Only    Past Surgical History:  Procedure Laterality Date  . ANKLE ARTHROSCOPY WITH OPEN REDUCTION INTERNAL FIXATION (ORIF)  07/2015   tri-malleolar  . BUNIONECTOMY Bilateral 2001, 2005  . CHOLECYSTECTOMY    . COLONOSCOPY  05/2010   normal  . GANGLION CYST EXCISION Left   . 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 Smoking status: Former Research scientist (life sciences)  .  Smokeless tobacco: Never Used  . Alcohol use 1.2 oz/week    2 Standard drinks or equivalent per week     Medication list has been reviewed and updated.   Physical Exam  Constitutional: She is oriented to person, place, and time. She appears well-developed. No distress.  HENT:  Head: Normocephalic and atraumatic.  Pulmonary/Chest: Effort normal. No respiratory distress. She has decreased breath sounds. She has no  wheezes. She has no rhonchi. She has no rales.  Musculoskeletal: Normal range of motion.  Neurological: She is alert and oriented to person, place, and time.  Skin: Skin is warm and dry. No rash noted.  Psychiatric: She has a normal mood and affect. Her behavior is normal. Thought content normal.  Nursing note and vitals reviewed.   BP 126/86   Pulse 73   Temp 97.6 F (36.4 C)   Ht 5' 3.5" (1.613 m)   Wt 161 lb (73 kg)   SpO2 95%   BMI 28.07 kg/m   Assessment and Plan: 1. Bronchitis Incompletely treated with Zpak Continue Delsym or Robitussin otc as needed - levofloxacin (LEVAQUIN) 500 MG tablet; Take 1 tablet (500 mg total) by mouth daily.  Dispense: 7 tablet; Refill: 0   Halina Maidens, MD [facility]  02/22/2016

## 2016-03-06 DIAGNOSIS — R69 Illness, unspecified: Secondary | ICD-10-CM | POA: Diagnosis not present

## 2016-03-07 ENCOUNTER — Other Ambulatory Visit: Payer: Self-pay | Admitting: Internal Medicine

## 2016-03-07 DIAGNOSIS — Z1231 Encounter for screening mammogram for malignant neoplasm of breast: Secondary | ICD-10-CM

## 2016-03-20 DIAGNOSIS — N3020 Other chronic cystitis without hematuria: Secondary | ICD-10-CM | POA: Diagnosis not present

## 2016-03-20 DIAGNOSIS — N393 Stress incontinence (female) (male): Secondary | ICD-10-CM | POA: Diagnosis not present

## 2016-03-20 DIAGNOSIS — R339 Retention of urine, unspecified: Secondary | ICD-10-CM | POA: Diagnosis not present

## 2016-03-21 DIAGNOSIS — M1712 Unilateral primary osteoarthritis, left knee: Secondary | ICD-10-CM | POA: Diagnosis not present

## 2016-05-02 ENCOUNTER — Ambulatory Visit
Admission: RE | Admit: 2016-05-02 | Discharge: 2016-05-02 | Disposition: A | Discharge status: Home / Self Care | Payer: Medicare HMO | Source: Ambulatory Visit | Attending: Internal Medicine | Admitting: Internal Medicine

## 2016-05-02 DIAGNOSIS — Z1231 Encounter for screening mammogram for malignant neoplasm of breast: Secondary | ICD-10-CM | POA: Diagnosis not present

## 2016-05-29 DIAGNOSIS — S82852D Displaced trimalleolar fracture of left lower leg, subsequent encounter for closed fracture with routine healing: Secondary | ICD-10-CM | POA: Diagnosis not present

## 2016-05-29 DIAGNOSIS — M25572 Pain in left ankle and joints of left foot: Secondary | ICD-10-CM | POA: Diagnosis not present

## 2016-05-29 DIAGNOSIS — G8929 Other chronic pain: Secondary | ICD-10-CM | POA: Diagnosis not present

## 2016-05-29 DIAGNOSIS — S82892S Other fracture of left lower leg, sequela: Secondary | ICD-10-CM | POA: Diagnosis not present

## 2016-06-01 ENCOUNTER — Other Ambulatory Visit: Payer: Self-pay | Admitting: Internal Medicine

## 2016-06-01 MED ORDER — HYDROCHLOROTHIAZIDE 25 MG PO TABS: 25.0000 mg | ORAL_TABLET | Freq: Every day | ORAL | 1 refills | Status: DC

## 2016-06-01 MED ORDER — METOPROLOL TARTRATE 50 MG PO TABS: 50.0000 mg | ORAL_TABLET | Freq: Every day | ORAL | 1 refills | Status: DC

## 2016-06-01 MED ORDER — SIMVASTATIN 20 MG PO TABS: 20.0000 mg | ORAL_TABLET | Freq: Every day | ORAL | 1 refills | Status: DC

## 2016-06-25 ENCOUNTER — Encounter: Payer: Self-pay | Admitting: Podiatry

## 2016-06-25 ENCOUNTER — Ambulatory Visit (INDEPENDENT_AMBULATORY_CARE_PROVIDER_SITE_OTHER): Payer: Medicare HMO | Admitting: Podiatry

## 2016-06-25 DIAGNOSIS — R2681 Unsteadiness on feet: Secondary | ICD-10-CM | POA: Diagnosis not present

## 2016-06-25 NOTE — Progress Notes (Signed)
   Subjective:    Patient ID: Alexandra Watson, female    DOB: 12/28/1945, 71 y.o.   MRN: [id number]  HPI: She presents today with a chief complaint of swelling to the left lower extremity and some numbness that seems to make her a little off balance. She states that she thinks a pair of orthotics might be best for her. She states that she broke her ankle last June and is considering having the internal fixation removed over this summer.    Review of Systems  Cardiovascular: Positive for leg swelling.  Gastrointestinal: Positive for constipation.  Musculoskeletal: Positive for arthralgias.  All other systems reviewed and are negative.      Objective:   Physical Exam: Vital signs are stable she is alert and oriented 3. Pulses are palpable. Neurologic sensory is intact. Deep tendon reflexes are intact. Muscle strength is normal. She has swelling to her left lower extremity at the level of the ankle but a full range of motion at the ankle joint and is nontender. Currently she walks with a slight antalgic gait to the left lower extremity but there are no major fall risks that I can sail. No neurological issues and does not appear to have any open lesions or wounds to the skin. No radiographs are taken today.        Assessment & Plan:  Painful internal fixation left foot.  Plan: I recommended that she hold off on any orthotics until her internal fixation has been removed completely. I expressed to her that this may eliminate any uneasiness with gait or any instability in her balance.

## 2016-07-06 ENCOUNTER — Encounter: Payer: Self-pay | Admitting: Internal Medicine

## 2016-07-06 ENCOUNTER — Ambulatory Visit (INDEPENDENT_AMBULATORY_CARE_PROVIDER_SITE_OTHER): Payer: Medicare HMO | Admitting: Internal Medicine

## 2016-07-06 VITALS — BP 118/72 | HR 64 | Ht 63.0 in | Wt 159.2 lb

## 2016-07-06 DIAGNOSIS — Z0000 Encounter for general adult medical examination without abnormal findings: Secondary | ICD-10-CM | POA: Diagnosis not present

## 2016-07-06 DIAGNOSIS — K219 Gastro-esophageal reflux disease without esophagitis: Secondary | ICD-10-CM

## 2016-07-06 DIAGNOSIS — I10 Essential (primary) hypertension: Secondary | ICD-10-CM | POA: Diagnosis not present

## 2016-07-06 DIAGNOSIS — Z1231 Encounter for screening mammogram for malignant neoplasm of breast: Secondary | ICD-10-CM

## 2016-07-06 DIAGNOSIS — E782 Mixed hyperlipidemia: Secondary | ICD-10-CM | POA: Diagnosis not present

## 2016-07-06 DIAGNOSIS — G629 Polyneuropathy, unspecified: Secondary | ICD-10-CM | POA: Diagnosis not present

## 2016-07-06 LAB — POCT URINALYSIS DIPSTICK
BILIRUBIN UA: NEGATIVE
Glucose, UA: NEGATIVE
Ketones, UA: NEGATIVE
NITRITE UA: NEGATIVE
PH UA: 6.5 (ref 5.0–8.0)
PROTEIN UA: NEGATIVE
RBC UA: NEGATIVE
Spec Grav, UA: <=1.005 — AB (ref 1.010–1.025)
Urobilinogen, UA: 0.2 E.U./dL

## 2016-07-06 MED ORDER — GABAPENTIN 300 MG PO CAPS: 300.0000 mg | ORAL_CAPSULE | Freq: Every day | ORAL | 3 refills | Status: DC

## 2016-07-06 NOTE — Progress Notes (Signed)
Patient: Alexandra Watson, Female    DOB: September 22, 1945, 71 y.o.   MRN: [id number] Visit Date: 07/06/2016  Today's Provider: Halina Maidens, MD   Chief Complaint  Patient presents with  . Medicare Wellness   Subjective:    Annual wellness visit Alexandra Watson is a 71 y.o. female who presents today for her Subsequent Annual Wellness Visit. She feels fairly well. She reports exercising little due to left ankle fx and chronic pain. She reports she is sleeping fairly well.   ----------------------------------------------------------- Hypertension  This is a chronic problem. The problem is controlled. Pertinent negatives include no chest pain, headaches, palpitations or shortness of breath.  Hyperlipidemia  This is a chronic problem. Pertinent negatives include no chest pain or shortness of breath. Current antihyperlipidemic treatment includes statins.  Gastroesophageal Reflux  She reports no abdominal pain, no chest pain, no coughing or no wheezing. This is a recurrent problem. The problem occurs occasionally. Pertinent negatives include no fatigue. She has tried a histamine-2 antagonist for the symptoms. The treatment provided significant relief.    Review of Systems  Constitutional: Negative for chills, fatigue and fever.  HENT: Negative for congestion, hearing loss, tinnitus, trouble swallowing and voice change.   Eyes: Negative for visual disturbance.  Respiratory: Negative for cough, chest tightness, shortness of breath and wheezing.   Cardiovascular: Negative for chest pain, palpitations and leg swelling.  Gastrointestinal: Negative for abdominal pain, constipation, diarrhea and vomiting.  Endocrine: Negative for polydipsia and polyuria.  Genitourinary: Negative for dysuria, frequency, genital sores, vaginal bleeding and vaginal discharge.  Musculoskeletal: Negative for arthralgias, gait problem and joint swelling.  Skin: Negative for color change and rash.  Neurological:  Negative for dizziness, tremors, light-headedness and headaches.  Hematological: Negative for adenopathy. Does not bruise/bleed easily.  Psychiatric/Behavioral: Negative for dysphoric mood and sleep disturbance. The patient is not nervous/anxious.     Social History   Social History  . Marital status: Married    Spouse name: N/A  . Number of children: N/A  . Years of education: N/A   Occupational History  . Not on file.   Social History Main Topics  . Smoking status: Former Research scientist (life sciences)  . Smokeless tobacco: Never Used  . Alcohol use 1.2 oz/week    2 Standard drinks or equivalent per week  . Drug use: No  . Sexual activity: Not on file   Other Topics Concern  . Not on file   Social History Narrative  . No narrative on file    Patient Active Problem List   Diagnosis Date Noted  . SUI (stress urinary incontinence, female) 11/11/2015  . Incomplete emptying of bladder 11/11/2015  . Chronic cystitis 11/11/2015  . Recurrent UTI 11/10/2015  . Fracture of ankle, closed 08/16/2015  . Fracture of ankle, trimalleolar, left, closed 08/08/2015  . Hematuria 07/01/2015  . Arthritis of knee, left 07/01/2015  . Insomnia 06/30/2015  . Atrophic vaginitis 06/30/2015  . Gastroesophageal reflux disease 06/30/2015  . Osteopenia 05/20/2015  . Anemia 09/14/2012  . Constipation 08/27/2012  . Arthritis 08/27/2012  . Essential hypertension 10/04/2011  . Hyperlipidemia, mixed 10/04/2011  . 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 ANKLE ARTHROSCOPY WITH OPEN REDUCTION INTERNAL FIXATION (ORIF)  07/2015   tri-malleolar  . BUNIONECTOMY Bilateral 2001, 2005  . CHOLECYSTECTOMY    . COLONOSCOPY  05/2010   normal  . GANGLION CYST EXCISION Left   . VATS Sleeve lobectomy Left 09/2011   typical variant  carcinoid    Her family history includes CAD in her brother and father; Stroke in her father and mother.     Previous Medications   ASPIRIN EC 81 MG  TABLET    Take 81 mg by mouth daily.   CALCIUM CARBONATE-VIT D-MIN (CALCIUM 1200 PO)    Take 2,400 mg by mouth.   CHOLECALCIFEROL PO    Take 1 tablet by mouth daily.   CONJUGATED ESTROGENS (PREMARIN) VAGINAL CREAM    Place vaginally.   DICLOFENAC SODIUM (VOLTAREN) 1 % GEL    Apply topically.   GABAPENTIN (NEURONTIN) 300 MG CAPSULE    Take 300 mg by mouth daily.   HYDROCHLOROTHIAZIDE (HYDRODIURIL) 25 MG TABLET    Take 1 tablet (25 mg total) by mouth daily.   METOPROLOL (LOPRESSOR) 50 MG TABLET    Take 1 tablet (50 mg total) by mouth daily.   POLYETHYLENE GLYCOL (MIRALAX / GLYCOLAX) PACKET    Take 17 g by mouth daily.   SIMVASTATIN (ZOCOR) 20 MG TABLET    Take 1 tablet (20 mg total) by mouth daily.   VITAMIN E 100 UNIT CAPSULE    Take by mouth daily.    Patient Care Team: Glean Hess, MD as PCP - General (Internal Medicine) Lerry Paterson, MD as Referring Physician (Surgical Oncology) Murrell Redden, MD (Urology) Dr. Geryl Councilman (Ophthalmology)      Objective:   Vitals: BP 118/72 (BP Location: Right Arm, Patient Position: Sitting, Cuff Size: Normal)   Pulse 64   Ht '5\' 3"'$  (1.6 m)   Wt 159 lb 3.2 oz (72.2 kg)   SpO2 98%   BMI 28.20 kg/m   Physical Exam  Constitutional: She is oriented to person, place, and time. She appears well-developed and well-nourished. No distress.  HENT:  Head: Normocephalic and atraumatic.  Nose: Right sinus exhibits no maxillary sinus tenderness. Left sinus exhibits no maxillary sinus tenderness.  Mouth/Throat: Uvula is midline and oropharynx is clear and moist.  Eyes: Conjunctivae and EOM are normal. Right eye exhibits no discharge. Left eye exhibits no discharge. No scleral icterus.  Neck: Normal range of motion. Carotid bruit is not present. No erythema present. No thyromegaly present.  Cardiovascular: Normal rate, regular rhythm, normal heart sounds and normal pulses.   Pulmonary/Chest: Effort normal. No respiratory distress. She has no wheezes. Right  breast exhibits no mass, no nipple discharge, no skin change and no tenderness. Left breast exhibits no mass, no nipple discharge, no skin change and no tenderness.  Abdominal: Soft. Bowel sounds are normal. There is no hepatosplenomegaly. There is no tenderness. There is no CVA tenderness.  Musculoskeletal: Normal range of motion.  Lymphadenopathy:    She has no cervical adenopathy.    She has no axillary adenopathy.  Neurological: She is alert and oriented to person, place, and time. She has normal reflexes. No cranial nerve deficit or sensory deficit.  Skin: Skin is warm, dry and intact. No rash noted.  Psychiatric: She has a normal mood and affect. Her speech is normal and behavior is normal. Thought content normal.  Nursing note and vitals reviewed.   Activities of Daily Living In your present state of health, do you have any difficulty performing the following activities: 07/06/2016  Hearing? N  Vision? N  Difficulty concentrating or making decisions? N  Walking or climbing stairs? N  Dressing or bathing? N  Doing errands, shopping? N  Preparing Food and eating ? N  Using the Toilet? N  In the past six  months, have you accidently leaked urine? N  Do you have problems with loss of bowel control? N  Managing your Medications? N  Managing your Finances? N  Housekeeping or managing your Housekeeping? N  Some recent data might be hidden    Fall Risk Assessment Fall Risk  07/06/2016 07/01/2015  Falls in the past year? Yes No  Number falls in past yr: 1 -  Injury with Fall? Yes -  Risk Factor Category  High Fall Risk -      Depression Screen PHQ 2/9 Scores 07/06/2016 07/01/2015  PHQ - 2 Score 0 1  PHQ- 9 Score - 2   6CIT Screen 07/06/2016  What Year? 0 points  What month? 0 points  What time? 0 points  Count back from 20 0 points  Months in reverse 0 points  Repeat phrase 0 points  Total Score 0    Medicare Annual Wellness Visit Summary:  Reviewed patient's Family  Medical History Reviewed and updated list of patient's medical providers Assessment of cognitive impairment was done Assessed patient's functional ability Established a written schedule for health screening [place] Completed and Reviewed  Exercise Activities and Dietary recommendations Goals    None      Immunization History  Administered Date(s) Administered  . Influenza, High Dose Seasonal PF 11/25/2014, 11/24/2015  . Pneumococcal Conjugate-13 02/02/2013  . Pneumococcal Polysaccharide-23 11/16/2010, 02/03/2014  . Tdap 06/10/2009    Health Maintenance  Topic Date Due  . COLONOSCOPY  08/25/1994  . INFLUENZA VACCINE  09/26/2016  . MAMMOGRAM  05/02/2017  . TETANUS/TDAP  06/11/2019  . DEXA SCAN  Completed  . Hepatitis C Screening  Addressed  . PNA vac Low Risk Adult  Completed    Discussed health benefits of physical activity, and encouraged her to engage in regular exercise appropriate for her age and condition.    ------------------------------------------------------------------------------------------------------------  Assessment & Plan:   1. Medicare annual wellness visit, subsequent Measures satisfied - POCT urinalysis dipstick  2. Essential hypertension controlled - CBC with Differential/Platelet - Comprehensive metabolic panel - TSH  3. Gastroesophageal reflux disease, esophagitis presence not specified  doing well on PRN zantac - CBC with Differential/Platelet  4. Hyperlipidemia, mixed On simvastatin - Lipid panel  5. Encounter for screening mammogram for breast cancer - MM DIGITAL SCREENING BILATERAL; Future  6. 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   Meds ordered this encounter  Medications  . gabapentin (NEURONTIN) 300 MG capsule    Sig: Take 1 capsule (300 mg total) by mouth daily.    Dispense:  90 capsule    Refill:  [place], MD [facility]  07/06/2016

## 2016-07-06 NOTE — Patient Instructions (Signed)
Health Maintenance  Topic Date Due  . INFLUENZA VACCINE  09/26/2016  . MAMMOGRAM  05/02/2017  . TETANUS/TDAP  06/11/2019  . COLONOSCOPY  05/27/2020  . DEXA SCAN  Completed  . Hepatitis C Screening  Addressed  . PNA vac Low Risk Adult  Completed

## 2016-07-07 LAB — CBC WITH DIFFERENTIAL/PLATELET
Basophils Absolute: 0 10*3/uL (ref 0.0–0.2)
Basos: 1 %
EOS (ABSOLUTE): 0.1 10*3/uL (ref 0.0–0.4)
Eos: 2 %
Hematocrit: 37.3 % (ref 34.0–46.6)
Hemoglobin: 12.6 g/dL (ref 11.1–15.9)
Immature Grans (Abs): 0 10*3/uL (ref 0.0–0.1)
Immature Granulocytes: 0 %
Lymphocytes Absolute: 1.7 10*3/uL (ref 0.7–3.1)
Lymphs: 39 %
MCH: 29.1 pg (ref 26.6–33.0)
MCHC: 33.8 g/dL (ref 31.5–35.7)
MCV: 86 fL (ref 79–97)
Monocytes Absolute: 0.4 10*3/uL (ref 0.1–0.9)
Monocytes: 9 %
Neutrophils Absolute: 2.2 10*3/uL (ref 1.4–7.0)
Neutrophils: 49 %
Platelets: 275 10*3/uL (ref 150–379)
RBC: 4.33 x10E6/uL (ref 3.77–5.28)
RDW: 14.1 % (ref 12.3–15.4)
WBC: 4.4 10*3/uL (ref 3.4–10.8)

## 2016-07-07 LAB — LIPID PANEL
CHOLESTEROL TOTAL: 174 mg/dL (ref 100–199)
Chol/HDL Ratio: 3.2 ratio (ref 0.0–4.4)
HDL: 55 mg/dL (ref >39)
LDL CALC: 101 mg/dL — AB (ref 0–99)
Triglycerides: 90 mg/dL (ref 0–149)
VLDL Cholesterol Cal: 18 mg/dL (ref 5–40)

## 2016-07-07 LAB — COMPREHENSIVE METABOLIC PANEL
ALK PHOS: 66 IU/L (ref 39–117)
ALT: 18 IU/L (ref 0–32)
AST: 19 IU/L (ref 0–40)
Albumin/Globulin Ratio: 2.2 (ref 1.2–2.2)
Albumin: 5 g/dL — ABNORMAL HIGH (ref 3.5–4.8)
BUN/Creatinine Ratio: 16 (ref 12–28)
BUN: 15 mg/dL (ref 8–27)
Bilirubin Total: 0.4 mg/dL (ref 0.0–1.2)
CALCIUM: 10.1 mg/dL (ref 8.7–10.3)
CO2: 26 mmol/L (ref 18–29)
CREATININE: 0.94 mg/dL (ref 0.57–1.00)
Chloride: 98 mmol/L (ref 96–106)
GFR calc Af Amer: 71 mL/min/{1.73_m2} (ref >59)
GFR calc non Af Amer: 61 mL/min/{1.73_m2} (ref >59)
GLOBULIN, TOTAL: 2.3 g/dL (ref 1.5–4.5)
GLUCOSE: 90 mg/dL (ref 65–99)
Potassium: 4.3 mmol/L (ref 3.5–5.2)
SODIUM: 142 mmol/L (ref 134–144)
Total Protein: 7.3 g/dL (ref 6.0–8.5)

## 2016-07-07 LAB — TSH: TSH: 1.21 u[IU]/mL (ref 0.450–4.500)

## 2016-07-26 DIAGNOSIS — N3020 Other chronic cystitis without hematuria: Secondary | ICD-10-CM | POA: Diagnosis not present

## 2016-07-26 DIAGNOSIS — R300 Dysuria: Secondary | ICD-10-CM | POA: Diagnosis not present

## 2016-09-04 DIAGNOSIS — N393 Stress incontinence (female) (male): Secondary | ICD-10-CM | POA: Diagnosis not present

## 2016-09-04 DIAGNOSIS — N3020 Other chronic cystitis without hematuria: Secondary | ICD-10-CM | POA: Diagnosis not present

## 2016-09-04 DIAGNOSIS — R339 Retention of urine, unspecified: Secondary | ICD-10-CM | POA: Diagnosis not present

## 2016-09-25 DIAGNOSIS — S82892D Other fracture of left lower leg, subsequent encounter for closed fracture with routine healing: Secondary | ICD-10-CM | POA: Diagnosis not present

## 2016-10-02 DIAGNOSIS — R69 Illness, unspecified: Secondary | ICD-10-CM | POA: Diagnosis not present

## 2016-11-22 DIAGNOSIS — C3490 Malignant neoplasm of unspecified part of unspecified bronchus or lung: Secondary | ICD-10-CM | POA: Diagnosis not present

## 2016-11-22 DIAGNOSIS — D3A00 Benign carcinoid tumor of unspecified site: Secondary | ICD-10-CM | POA: Diagnosis not present

## 2016-11-22 DIAGNOSIS — Z23 Encounter for immunization: Secondary | ICD-10-CM | POA: Diagnosis not present

## 2016-11-22 DIAGNOSIS — R911 Solitary pulmonary nodule: Secondary | ICD-10-CM | POA: Diagnosis not present

## 2016-11-22 DIAGNOSIS — D3A090 Benign carcinoid tumor of the bronchus and lung: Secondary | ICD-10-CM | POA: Diagnosis not present

## 2016-11-22 DIAGNOSIS — Z902 Acquired absence of lung [part of]: Secondary | ICD-10-CM | POA: Diagnosis not present

## 2016-11-22 DIAGNOSIS — Z87891 Personal history of nicotine dependence: Secondary | ICD-10-CM | POA: Diagnosis not present

## 2016-12-05 ENCOUNTER — Other Ambulatory Visit: Payer: Self-pay | Admitting: Internal Medicine

## 2016-12-13 DIAGNOSIS — D3A00 Benign carcinoid tumor of unspecified site: Secondary | ICD-10-CM | POA: Diagnosis not present

## 2016-12-13 DIAGNOSIS — R918 Other nonspecific abnormal finding of lung field: Secondary | ICD-10-CM | POA: Diagnosis not present

## 2016-12-17 DIAGNOSIS — L218 Other seborrheic dermatitis: Secondary | ICD-10-CM | POA: Diagnosis not present

## 2016-12-17 DIAGNOSIS — L821 Other seborrheic keratosis: Secondary | ICD-10-CM | POA: Diagnosis not present

## 2016-12-17 DIAGNOSIS — L601 Onycholysis: Secondary | ICD-10-CM | POA: Diagnosis not present

## 2016-12-17 DIAGNOSIS — Z859 Personal history of malignant neoplasm, unspecified: Secondary | ICD-10-CM | POA: Diagnosis not present

## 2016-12-17 DIAGNOSIS — L578 Other skin changes due to chronic exposure to nonionizing radiation: Secondary | ICD-10-CM | POA: Diagnosis not present

## 2016-12-28 ENCOUNTER — Emergency Department
Admission: EM | Admit: 2016-12-28 | Discharge: 2016-12-28 | Disposition: A | Discharge status: Home / Self Care | Payer: Medicare HMO | Attending: Emergency Medicine | Admitting: Emergency Medicine

## 2016-12-28 ENCOUNTER — Encounter: Payer: Self-pay | Admitting: Emergency Medicine

## 2016-12-28 DIAGNOSIS — Z79899 Other long term (current) drug therapy: Secondary | ICD-10-CM | POA: Insufficient documentation

## 2016-12-28 DIAGNOSIS — I10 Essential (primary) hypertension: Secondary | ICD-10-CM | POA: Diagnosis not present

## 2016-12-28 DIAGNOSIS — Z7982 Long term (current) use of aspirin: Secondary | ICD-10-CM | POA: Diagnosis not present

## 2016-12-28 DIAGNOSIS — Z87891 Personal history of nicotine dependence: Secondary | ICD-10-CM | POA: Diagnosis not present

## 2016-12-28 DIAGNOSIS — R040 Epistaxis: Secondary | ICD-10-CM | POA: Diagnosis not present

## 2016-12-28 MED ORDER — SILVER NITRATE-POT NITRATE 75-25 % EX MISC
CUTANEOUS | Status: AC
  Filled 2016-12-28: qty 3

## 2016-12-28 MED ORDER — OXYMETAZOLINE HCL 0.05 % NA SOLN
NASAL | Status: AC
  Filled 2016-12-28: qty 15

## 2016-12-28 MED ORDER — PHENYLEPHRINE HCL 0.5 % NA SOLN
1.0000 [drp] | Freq: Once | NASAL | Status: AC
  Administered 2016-12-28: 1 [drp] via NASAL
  Filled 2016-12-28: qty 15

## 2016-12-28 NOTE — ED Provider Notes (Signed)
[facility] Emergency Department Provider Note  ____________________________________________  Time seen: Approximately 6:02 PM  I have reviewed the triage vital signs and the nursing notes.   HISTORY  Chief Complaint Epistaxis   HPI Alexandra Watson is a 71 y.o. female with a history of hypertension who presents for evaluation of epistaxis. Patient reports nose bleeding starting in the left side an hour prior to arrival. EMS attempted to use Afrin with a nose clamp. By the time patient got in the room she still had active bleeding from the left nare. Hemodynamically stable.Patient endorses compliance with her antihypertensives. She has had one prior episode of epistaxis 20 years ago when she was diagnosed with high blood pressure. She is on aspirin but no other blood thinners. Her symptoms have been constant since an hour prior to arrival.  Past Medical History:  Diagnosis Date  . GERD (gastroesophageal reflux disease)   . Hyperlipidemia   . Hypertension   . 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 Neuropathy 07/06/2016  . SUI (stress urinary incontinence, female) 11/11/2015  . Incomplete emptying of bladder 11/11/2015  . Chronic cystitis 11/11/2015  . Recurrent UTI 11/10/2015  . Fracture of ankle, closed 08/16/2015  . Fracture of ankle, trimalleolar, left, closed 08/08/2015  . Hematuria 07/01/2015  . Arthritis of knee, left 07/01/2015  . Insomnia 06/30/2015  . Atrophic vaginitis 06/30/2015  . Gastroesophageal reflux disease 06/30/2015  . Osteopenia 05/20/2015  . Anemia 09/14/2012  . Constipation 08/27/2012  . Arthritis 08/27/2012  . Essential hypertension 10/04/2011  . Hyperlipidemia, mixed 10/04/2011  . 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 ANKLE ARTHROSCOPY WITH OPEN REDUCTION INTERNAL FIXATION (ORIF)  07/2015   tri-malleolar   . BUNIONECTOMY Bilateral 2001, 2005  . CHOLECYSTECTOMY    . COLONOSCOPY  05/2010   normal  . GANGLION CYST EXCISION Left   . 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 Authorizing Provider  aspirin EC 81 MG tablet Take 81 mg by mouth daily.    [provider]  Calcium Carbonate-Vit D-Min (CALCIUM 1200 PO) Take 2,400 mg by mouth.    [provider]  CHOLECALCIFEROL PO Take 1 tablet by mouth daily.    [provider]  conjugated estrogens (PREMARIN) vaginal cream Place vaginally.    [provider]  diclofenac sodium (VOLTAREN) 1 % GEL Apply topically. 05/29/16 05/29/17  [provider]  gabapentin (NEURONTIN) 300 MG capsule Take 1 capsule (300 mg total) by mouth daily. 07/06/16   Glean Hess, MD  hydrochlorothiazide (HYDRODIURIL) 25 MG tablet TAKE 1 TABLET DAILY 12/05/16   Glean Hess, MD  metoprolol tartrate (LOPRESSOR) 50 MG tablet TAKE 1 TABLET DAILY 12/05/16   Glean Hess, MD  polyethylene glycol [facility] / GLYCOLAX) packet Take 17 g by mouth daily. 08/24/15   Glean Hess, MD  simvastatin (ZOCOR) 20 MG tablet Take 1 tablet (20 mg total) by mouth daily. 06/01/16   Glean Hess, MD  vitamin E 100 UNIT capsule Take by mouth daily.    [provider]    Allergies Iodinated diagnostic agents and Oxycodone  Family History  Problem Relation Age of Onset  . CAD Brother   . CAD Father   . Stroke  Father   . Stroke Mother   . Breast cancer Neg Hx     Social History Social History  Substance Use Topics  . Smoking status: Former Research scientist (life sciences)  . Smokeless tobacco: Never Used  . Alcohol use 1.2 oz/week    2 Standard drinks or equivalent per week    Review of Systems  Constitutional: Negative for fever. Eyes: Negative for visual changes. ENT: Negative for sore throat. + epistaxis Neck: No neck pain    Cardiovascular: Negative for chest pain. Respiratory: Negative for shortness of breath. Gastrointestinal: Negative for abdominal pain, vomiting or diarrhea. Genitourinary: Negative for dysuria. Musculoskeletal: Negative for back pain. Skin: Negative for rash. Neurological: Negative for headaches, weakness or numbness. Psych: No SI or HI  ____________________________________________   PHYSICAL EXAM:  VITAL SIGNS: ED Triage Vitals  Enc Vitals Group     BP 12/28/16 1433 (!) 162/79     Pulse Rate 12/28/16 1433 70     Resp 12/28/16 1433 20     Temp 12/28/16 1433 98.4 F (36.9 C)     Temp Source 12/28/16 1433 Oral     SpO2 12/28/16 1433 99 %     Weight 12/28/16 1434 155 lb (70.3 kg)     Height 12/28/16 1434 5' 3.5" (1.613 m)     Head Circumference --      Peak Flow --      Pain Score --      Pain Loc --      Pain Edu? --      Excl. in [place]? --     Constitutional: Alert and oriented. Well appearing and in no apparent distress. HEENT:      Head: Normocephalic and atraumatic.         Eyes: Conjunctivae are normal. Sclera is non-icteric.       Mouth/Throat: Mucous membranes are moist.       Nose: Active bleeding from left nare      Neck: Supple with no signs of meningismus. Cardiovascular: Regular rate and rhythm. No murmurs, gallops, or rubs. 2+ symmetrical distal pulses are present in all extremities. No JVD. Respiratory: Normal respiratory effort. Lungs are clear to auscultation bilaterally. No wheezes, crackles, or rhonchi.  Musculoskeletal: Nontender with normal range of motion in all extremities. No edema, cyanosis, or erythema of extremities. Neurologic: Normal speech and language. Face is symmetric. Moving all extremities. No gross focal neurologic deficits are appreciated. Skin: Skin is warm, dry and intact. No rash noted. Psychiatric: Mood and affect are normal. Speech and behavior are normal.  ____________________________________________   LABS (all labs ordered  are listed, but only abnormal results are displayed)  Labs Reviewed - No data to display ____________________________________________  EKG  none  ____________________________________________  RADIOLOGY  none  ____________________________________________   PROCEDURES  Procedure(s) performed:yes Procedures   Epistaxis Management Date/Time: 12/28/2016 at 7:18 PM Performed by: Rudene Re Authorized by: Rudene Re Consent: Verbal consent obtained. Written consent not obtained. Risks and benefits: risks, benefits and alternatives were discussed Consent given by: patient Required items: required blood products, implants, devices, and special equipment available Patient sedated: no Repair method: afrin and nasal clamp for 20 min Post-procedure assessment: bleeding stopped after 15 minutes with pressure in place Treatment complexity: simple Patient tolerance: Patient tolerated the procedure well with no immediate complications   Critical Care performed:  None ____________________________________________   INITIAL IMPRESSION / ASSESSMENT AND PLAN / ED COURSE  71 y.o. female with a history of hypertension who presents for evaluation of  epistaxis. Bleeding controlled after afrin and 20 min of pressure. No obvious areas of active bleeding amenable to silver nitrate cautery seen on exam. Patient monitor for 1 hour with no recurrence of epistaxis. Patient dc home with nasal clamp and afrin.      As part of my medical decision making, I reviewed the following data within the [place] notes reviewed and incorporated, Notes from prior ED visits and [place] Controlled Substance Database    Pertinent labs & imaging results that were available during my care of the patient were reviewed by me and considered in my medical decision making (see chart for details).    ____________________________________________   FINAL CLINICAL IMPRESSION(S) / ED  DIAGNOSES  Final diagnoses:  Anterior epistaxis      NEW MEDICATIONS STARTED DURING THIS VISIT:  New Prescriptions   No medications on file     Note:  This document was prepared using Dragon voice recognition software and may include unintentional dictation errors.    Rudene Re, MD 12/28/16 Lurena Nida

## 2016-12-28 NOTE — ED Triage Notes (Signed)
Pt presents to ED c/o nosebleed that started at home when sitting. Pt callled 911, pt was given afrin at 1318 and pinched her nose. Pt states there is still a little "dripping" but it has much improved . Pt takes aspirin 81 mg at night otherwise no anticoagulants

## 2016-12-28 NOTE — ED Notes (Signed)
ED Provider at bedside. 

## 2016-12-28 NOTE — ED Triage Notes (Signed)
FIRST NURSE NOTE-here for nosebleed starting 1 hr ago. Active bleeding. EMS "sprayed something in my nose". Clamp placed. Ambulatory. No distress.

## 2016-12-31 ENCOUNTER — Encounter: Payer: Self-pay | Admitting: Internal Medicine

## 2016-12-31 ENCOUNTER — Ambulatory Visit (INDEPENDENT_AMBULATORY_CARE_PROVIDER_SITE_OTHER): Payer: Medicare HMO | Admitting: Internal Medicine

## 2016-12-31 ENCOUNTER — Ambulatory Visit: Payer: Medicare HMO | Admitting: Internal Medicine

## 2016-12-31 VITALS — BP 132/68 | HR 68 | Ht 63.5 in | Wt 156.8 lb

## 2016-12-31 DIAGNOSIS — Z859 Personal history of malignant neoplasm, unspecified: Secondary | ICD-10-CM

## 2016-12-31 DIAGNOSIS — R040 Epistaxis: Secondary | ICD-10-CM | POA: Diagnosis not present

## 2016-12-31 DIAGNOSIS — I10 Essential (primary) hypertension: Secondary | ICD-10-CM | POA: Diagnosis not present

## 2016-12-31 DIAGNOSIS — E782 Mixed hyperlipidemia: Secondary | ICD-10-CM

## 2016-12-31 MED ORDER — SIMVASTATIN 20 MG PO TABS: 20.0000 mg | ORAL_TABLET | Freq: Every day | ORAL | 3 refills | Status: DC

## 2016-12-31 NOTE — Progress Notes (Signed)
Date:  12/31/2016   Name:  Alexandra Watson   DOB:  09/23/1945   MRN:  [id number]   Chief Complaint: Hypertension Hypertension  This is a chronic problem. The problem is unchanged. The problem is controlled. Pertinent negatives include no chest pain, palpitations or shortness of breath. The current treatment provides significant improvement.  Epistaxis   The bleeding has been from the left nare. This is a new problem. The problem has been resolved. She has tried nasal tampon and vasoconstrictor for the symptoms. The treatment provided significant relief.  Hyperlipidemia  This is a chronic problem. The problem is controlled. Pertinent negatives include no chest pain or shortness of breath. Current antihyperlipidemic treatment includes statins.   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  Seen at [facility] every 6 months.  Recent PET scan was clear.   Review of Systems  Constitutional: Negative for chills, fatigue and fever.  HENT: Positive for nosebleeds (stopped in ER) and sinus pressure. Negative for postnasal drip, rhinorrhea and trouble swallowing.   Eyes: Negative for visual disturbance.  Respiratory: Negative for chest tightness, shortness of breath and wheezing.   Cardiovascular: Negative for chest pain and palpitations.  Gastrointestinal: Negative for abdominal pain.  Genitourinary: Negative for dysuria.  Musculoskeletal: Positive for arthralgias (ankle).  Allergic/Immunologic: Negative for environmental allergies.  Psychiatric/Behavioral: Negative for decreased concentration. The patient is not nervous/anxious.     Patient Active Problem List   Diagnosis Date Noted  . Neuropathy 07/06/2016  . SUI (stress urinary incontinence, female) 11/11/2015  . Incomplete emptying of bladder 11/11/2015  . Recurrent UTI 11/10/2015  . Fracture of ankle, trimalleolar, left, closed 08/08/2015  . Arthritis of knee, left 07/01/2015  . Insomnia 06/30/2015  . Atrophic vaginitis 06/30/2015  .  Gastroesophageal reflux disease 06/30/2015  . Osteopenia 05/20/2015  . Anemia 09/14/2012  . Constipation 08/27/2012  . Essential hypertension 10/04/2011  . Hyperlipidemia, mixed 10/04/2011  . 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 Authorizing Provider  Calcium Carbonate-Vit D-Min (CALCIUM 1200 PO) Take 2,400 mg by mouth.   Yes [provider]  CHOLECALCIFEROL PO Take 1 tablet by mouth daily.   Yes [provider]  co-enzyme Q-10 30 MG capsule Take 30 mg 3 (three) times daily by mouth.   Yes [provider]  conjugated estrogens (PREMARIN) vaginal cream Place vaginally.   Yes [provider]  gabapentin (NEURONTIN) 300 MG capsule Take 1 capsule (300 mg total) by mouth daily. 07/06/16  Yes Glean Hess, MD  hydrochlorothiazide (HYDRODIURIL) 25 MG tablet TAKE 1 TABLET DAILY 12/05/16  Yes Glean Hess, MD  metoprolol tartrate (LOPRESSOR) 50 MG tablet TAKE 1 TABLET DAILY 12/05/16  Yes Glean Hess, MD  Omega-3 Fatty Acids (FISH OIL) 1000 MG CAPS Take by mouth.   Yes [provider]  polyethylene glycol (MIRALAX / GLYCOLAX) packet Take 17 g by mouth daily. 08/24/15  Yes Glean Hess, MD  simvastatin (ZOCOR) 20 MG tablet Take 1 tablet (20 mg total) by mouth daily. 06/01/16  Yes Glean Hess, MD  vitamin E 100 UNIT capsule Take by mouth daily.   Yes [provider]  aspirin EC 81 MG tablet Take 81 mg by mouth daily.    [provider]    Allergies  Allergen Reactions  . Iodinated Diagnostic Agents Hives  . Oxycodone Nausea And Vomiting and Nausea Only  Past Surgical History:  Procedure Laterality Date  . ANKLE ARTHROSCOPY WITH OPEN REDUCTION INTERNAL FIXATION (ORIF)  07/2015   tri-malleolar  . BUNIONECTOMY Bilateral 2001, 2005  . CHOLECYSTECTOMY    . COLONOSCOPY  05/2010   normal  . GANGLION CYST EXCISION Left   .  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 Smoking status: Former Research scientist (life sciences)  . Smokeless tobacco: Never Used  Substance Use Topics  . Alcohol use: Yes    Alcohol/week: 1.2 oz    Types: 2 Standard drinks or equivalent per week  . Drug use: No     Medication list has been reviewed and updated.  PHQ 2/9 Scores 07/06/2016 07/01/2015  PHQ - 2 Score 0 1  PHQ- 9 Score - 2    Physical Exam  Constitutional: She is oriented to person, place, and time. She appears well-developed. No distress.  HENT:  Head: Normocephalic and atraumatic.  Nose: No mucosal edema, nose lacerations or nasal septal hematoma. No epistaxis. Right sinus exhibits no maxillary sinus tenderness. Left sinus exhibits no maxillary sinus tenderness.  Neck: Normal range of motion. Neck supple. No thyromegaly present.  Cardiovascular: Normal rate, regular rhythm and normal heart sounds.  Pulmonary/Chest: Effort normal and breath sounds normal. No respiratory distress. She has no wheezes. She has no rhonchi.  Abdominal: Soft.  Musculoskeletal: Normal range of motion. She exhibits no edema or tenderness.  Neurological: She is alert and oriented to person, place, and time.  Skin: Skin is warm and dry. No rash noted.  Psychiatric: She has a normal mood and affect. Her behavior is normal. Thought content normal.  Nursing note and vitals reviewed.   BP 132/68   Pulse 68   Ht 5' 3.5" (1.613 m)   Wt 156 lb 12.8 oz (71.1 kg)   SpO2 98%   BMI 27.34 kg/m   Assessment and Plan: 1. Essential hypertension controlled - CBC with Differential/Platelet  2. Epistaxis not due to trauma Check CBC Future care instructions given - CBC with Differential/Platelet  3. Hyperlipidemia, mixed On statin therapy  4. History of malignant carcinoid tumor Followed by [facility] Recent PET normal   Meds ordered this encounter  Medications  . simvastatin (ZOCOR) 20 MG tablet    Sig: Take  1 tablet (20 mg total) daily by mouth.    Dispense:  90 tablet    Refill:  3    Partially dictated using Editor, commissioning. Any errors are unintentional.  Halina Maidens, MD [facility]  12/31/2016

## 2016-12-31 NOTE — Patient Instructions (Addendum)
Use Saline nasal spray 2-3 times a day  Use tylenol preferred over advil as needed - avoid the "sinus preparations"  Restart aspirin at the end of the week

## 2017-01-01 LAB — CBC WITH DIFFERENTIAL/PLATELET
Basophils Absolute: 0 10*3/uL (ref 0.0–0.2)
Basos: 0 %
EOS (ABSOLUTE): 0.1 10*3/uL (ref 0.0–0.4)
Eos: 2 %
HEMOGLOBIN: 11.3 g/dL (ref 11.1–15.9)
Hematocrit: 34.3 % (ref 34.0–46.6)
IMMATURE GRANS (ABS): 0 10*3/uL (ref 0.0–0.1)
Immature Granulocytes: 0 %
LYMPHS: 32 %
Lymphocytes Absolute: 2.1 10*3/uL (ref 0.7–3.1)
MCH: 28.8 pg (ref 26.6–33.0)
MCHC: 32.9 g/dL (ref 31.5–35.7)
MCV: 88 fL (ref 79–97)
MONOCYTES: 7 %
Monocytes Absolute: 0.5 10*3/uL (ref 0.1–0.9)
NEUTROS ABS: 3.9 10*3/uL (ref 1.4–7.0)
Neutrophils: 59 %
PLATELETS: 272 10*3/uL (ref 150–379)
RBC: 3.92 x10E6/uL (ref 3.77–5.28)
RDW: 13.6 % (ref 12.3–15.4)
WBC: 6.6 10*3/uL (ref 3.4–10.8)

## 2017-01-06 DIAGNOSIS — R040 Epistaxis: Secondary | ICD-10-CM | POA: Diagnosis not present

## 2017-01-06 DIAGNOSIS — E7800 Pure hypercholesterolemia, unspecified: Secondary | ICD-10-CM | POA: Diagnosis not present

## 2017-01-06 DIAGNOSIS — I10 Essential (primary) hypertension: Secondary | ICD-10-CM | POA: Diagnosis not present

## 2017-01-07 ENCOUNTER — Ambulatory Visit: Payer: Medicare HMO | Admitting: Internal Medicine

## 2017-01-15 DIAGNOSIS — R040 Epistaxis: Secondary | ICD-10-CM | POA: Diagnosis not present

## 2017-01-15 DIAGNOSIS — H6123 Impacted cerumen, bilateral: Secondary | ICD-10-CM | POA: Diagnosis not present

## 2017-01-21 ENCOUNTER — Other Ambulatory Visit: Payer: Self-pay

## 2017-01-21 MED ORDER — POLYETHYLENE GLYCOL 3350 17 G PO PACK: 17.0000 g | PACK | Freq: Every day | ORAL | 3 refills | Status: DC

## 2017-01-30 DIAGNOSIS — H524 Presbyopia: Secondary | ICD-10-CM | POA: Diagnosis not present

## 2017-02-07 DIAGNOSIS — R339 Retention of urine, unspecified: Secondary | ICD-10-CM | POA: Diagnosis not present

## 2017-02-07 DIAGNOSIS — N393 Stress incontinence (female) (male): Secondary | ICD-10-CM | POA: Diagnosis not present

## 2017-02-07 DIAGNOSIS — N3020 Other chronic cystitis without hematuria: Secondary | ICD-10-CM | POA: Diagnosis not present

## 2017-03-06 DIAGNOSIS — R350 Frequency of micturition: Secondary | ICD-10-CM | POA: Diagnosis not present

## 2017-03-20 ENCOUNTER — Other Ambulatory Visit: Payer: Self-pay | Admitting: Internal Medicine

## 2017-03-20 DIAGNOSIS — Z1231 Encounter for screening mammogram for malignant neoplasm of breast: Secondary | ICD-10-CM

## 2017-03-29 ENCOUNTER — Ambulatory Visit (INDEPENDENT_AMBULATORY_CARE_PROVIDER_SITE_OTHER): Payer: Medicare HMO | Admitting: Internal Medicine

## 2017-03-29 ENCOUNTER — Encounter: Payer: Self-pay | Admitting: Internal Medicine

## 2017-03-29 VITALS — BP 128/70 | HR 90 | Ht 63.5 in | Wt 158.0 lb

## 2017-03-29 DIAGNOSIS — G5603 Carpal tunnel syndrome, bilateral upper limbs: Secondary | ICD-10-CM

## 2017-03-29 DIAGNOSIS — M25521 Pain in right elbow: Secondary | ICD-10-CM

## 2017-03-29 MED ORDER — PREDNISONE 10 MG PO TABS: ORAL_TABLET | ORAL | 0 refills | Status: DC

## 2017-03-29 NOTE — Progress Notes (Signed)
Date:  03/29/2017   Name:  Alexandra Watson   DOB:  [birth year]   MRN:  [id number]   Chief Complaint: Elbow Pain (Rt elbow- started 2 weeks ago. Pain radiates down arm and up into back. No injury to arm. Does exercise and lift 3-4lb weights. ) and Numbness (Numbness in both hands. Started several months ago.  )  Arm Pain   There was no injury mechanism. The pain is present in the right elbow. The quality of the pain is described as aching. The pain radiates to the right arm. The pain is mild. The pain has been fluctuating since the incident. Pertinent negatives include no chest pain. The symptoms are aggravated by movement, lifting and palpation.   Numbness - has tingling in both hands, often upon wakening in the morning.  Quickly resolves with shaking.  No weakness or pain.  Review of Systems  Constitutional: Negative for chills, fatigue and fever.  Respiratory: Negative for chest tightness, shortness of breath and wheezing.   Cardiovascular: Negative for chest pain and palpitations.  Musculoskeletal: Positive for arthralgias, myalgias and neck stiffness. Negative for gait problem and joint swelling.  Hematological: Negative for adenopathy.  Psychiatric/Behavioral: Positive for sleep disturbance. Negative for dysphoric mood.    Patient Active Problem List   Diagnosis Date Noted  . Neuropathy 07/06/2016  . SUI (stress urinary incontinence, female) 11/11/2015  . Incomplete emptying of bladder 11/11/2015  . Recurrent UTI 11/10/2015  . Fracture of ankle, trimalleolar, left, closed 08/08/2015  . Arthritis of knee, left 07/01/2015  . Insomnia 06/30/2015  . Atrophic vaginitis 06/30/2015  . Gastroesophageal reflux disease 06/30/2015  . Osteopenia 05/20/2015  . Anemia 09/14/2012  . Constipation 08/27/2012  . Essential hypertension 10/04/2011  . Hyperlipidemia, mixed 10/04/2011  . 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 Authorizing Provider  Calcium Carbonate-Vit D-Min (CALCIUM 1200 PO) Take 2,400 mg by mouth.   Yes [provider]  CHOLECALCIFEROL PO Take 1 tablet by mouth daily.   Yes [provider]  gabapentin (NEURONTIN) 300 MG capsule Take 1 capsule (300 mg total) by mouth daily. 07/06/16  Yes Glean Hess, MD  hydrochlorothiazide (HYDRODIURIL) 25 MG tablet TAKE 1 TABLET DAILY 12/05/16  Yes Glean Hess, MD  metoprolol tartrate (LOPRESSOR) 50 MG tablet TAKE 1 TABLET DAILY 12/05/16  Yes Glean Hess, MD  Omega-3 Fatty Acids (FISH OIL) 1000 MG CAPS Take by mouth.   Yes [provider]  polyethylene glycol (MIRALAX / GLYCOLAX) packet Take 17 g by mouth daily. 01/21/17  Yes Glean Hess, MD  Probiotic Product (BIOHM PROBIOTIC SUPPLEMENT PO) Take by mouth.   Yes [provider]  simvastatin (ZOCOR) 20 MG tablet Take 1 tablet (20 mg total) daily by mouth. 12/31/16  Yes Glean Hess, MD    Allergies  Allergen Reactions  . Iodinated Diagnostic Agents Hives  . Oxycodone Nausea And Vomiting and Nausea Only    Past Surgical History:  Procedure Laterality Date  . ANKLE ARTHROSCOPY WITH OPEN REDUCTION INTERNAL FIXATION (ORIF)  07/2015   tri-malleolar  . BUNIONECTOMY Bilateral 2001, 2005  . CHOLECYSTECTOMY    . COLONOSCOPY  05/2010   normal  . GANGLION CYST EXCISION Left   . 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 Smoking status: Former Research scientist (life sciences)  . Smokeless tobacco:  Never Used  Substance Use Topics  . Alcohol use: Yes    Alcohol/week: 1.2 oz    Types: 2 Standard drinks or equivalent per week  . Drug use: No     Medication list has been reviewed and updated.  PHQ 2/9 Scores 07/06/2016 07/01/2015  PHQ - 2 Score 0 1  PHQ- 9 Score - 2    Physical Exam  Constitutional: She is oriented to person, place, and time. She appears well-developed. No distress.    HENT:  Head: Normocephalic and atraumatic.  Neck: Normal range of motion. Neck supple. No thyromegaly present.  Cardiovascular: Normal rate, regular rhythm and normal heart sounds.  Pulmonary/Chest: Effort normal. No respiratory distress.  Musculoskeletal: Normal range of motion. She exhibits tenderness (right elbow - no swelling or warmth). She exhibits no edema.       Right elbow: She exhibits no swelling, no effusion, no deformity and no laceration. Tenderness found. Lateral epicondyle tenderness noted.  Tinel's and Phalen's negative bilaterally  Neurological: She is alert and oriented to person, place, and time. She has normal strength. No sensory deficit.  Skin: Skin is warm and dry. No rash noted.  Psychiatric: She has a normal mood and affect. Her behavior is normal. Thought content normal.  Nursing note and vitals reviewed.   BP 128/70   Pulse 90   Ht 5' 3.5" (1.613 m)   Wt 158 lb (71.7 kg)   SpO2 97%   BMI 27.55 kg/m   Assessment and Plan: 1. Elbow pain, right Mild tendonitis suspected - predniSONE (DELTASONE) 10 MG tablet; Take 6 on day 1, 5 on day 2, 4 on day 3, 3 on day 4, 2 on day 5 and 1 on day 1 then stop.  Dispense: 21 tablet; Refill: 0  2. Carpal tunnel syndrome, bilateral Mild, will not pursue evaluation unless it worsens - predniSONE (DELTASONE) 10 MG tablet; Take 6 on day 1, 5 on day 2, 4 on day 3, 3 on day 4, 2 on day 5 and 1 on day 1 then stop.  Dispense: 21 tablet; Refill: 0   Meds ordered this encounter  Medications  . predniSONE (DELTASONE) 10 MG tablet    Sig: Take 6 on day 1, 5 on day 2, 4 on day 3, 3 on day 4, 2 on day 5 and 1 on day 1 then stop.    Dispense:  21 tablet    Refill:  0    Partially dictated using Editor, commissioning. Any errors are unintentional.  Halina Maidens, MD [facility]  03/29/2017

## 2017-04-02 ENCOUNTER — Other Ambulatory Visit: Payer: Self-pay | Admitting: Internal Medicine

## 2017-04-02 DIAGNOSIS — E2839 Other primary ovarian failure: Secondary | ICD-10-CM

## 2017-04-15 DIAGNOSIS — R69 Illness, unspecified: Secondary | ICD-10-CM | POA: Diagnosis not present

## 2017-04-17 IMAGING — DX DG TIBIA/FIBULA 2V*L*
4 series · 4 of 4 positions shown · non-contrast
Comparison: None.

CLINICAL DATA: C/o pain swelling after missing step and falling
today

EXAM:
LEFT TIBIA AND FIBULA - 2 VIEW

[tibia ap (1 of 2)]
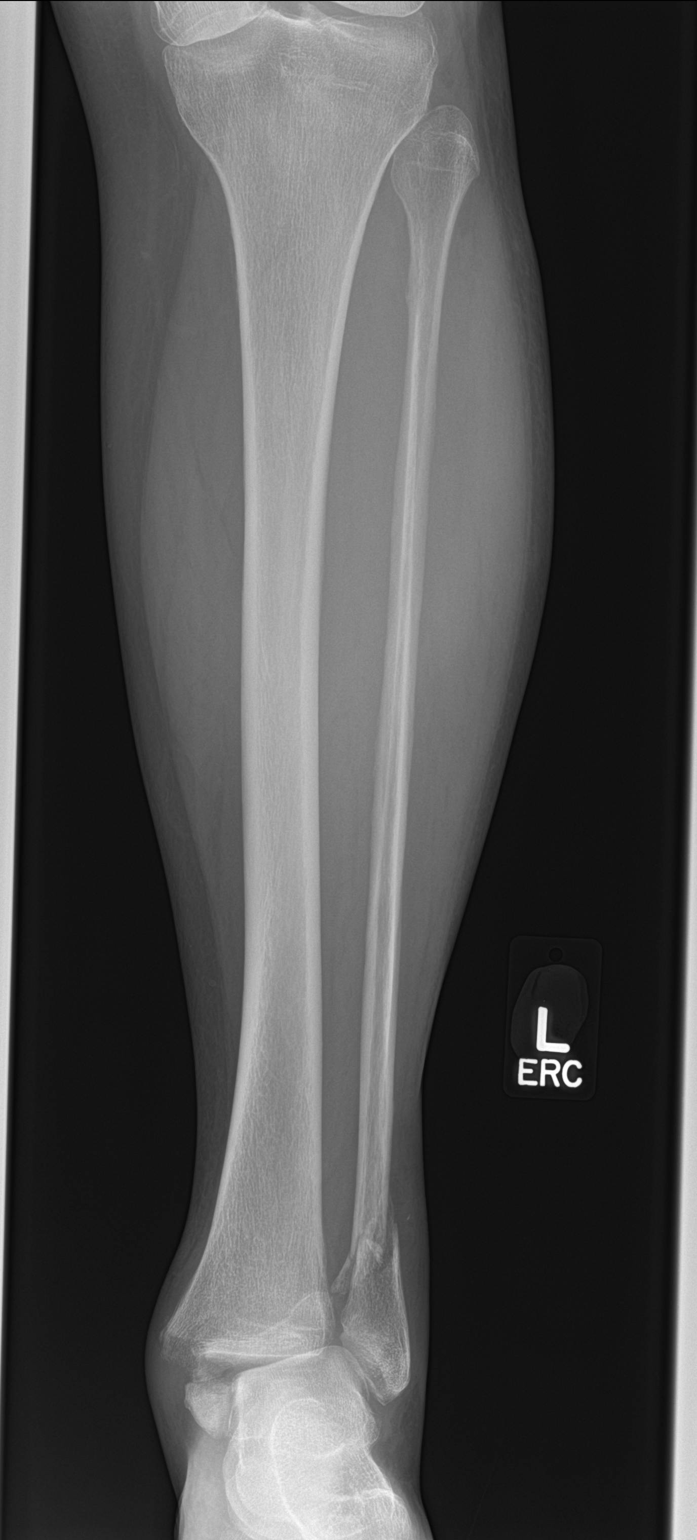

[tibia ap (2 of 2)]
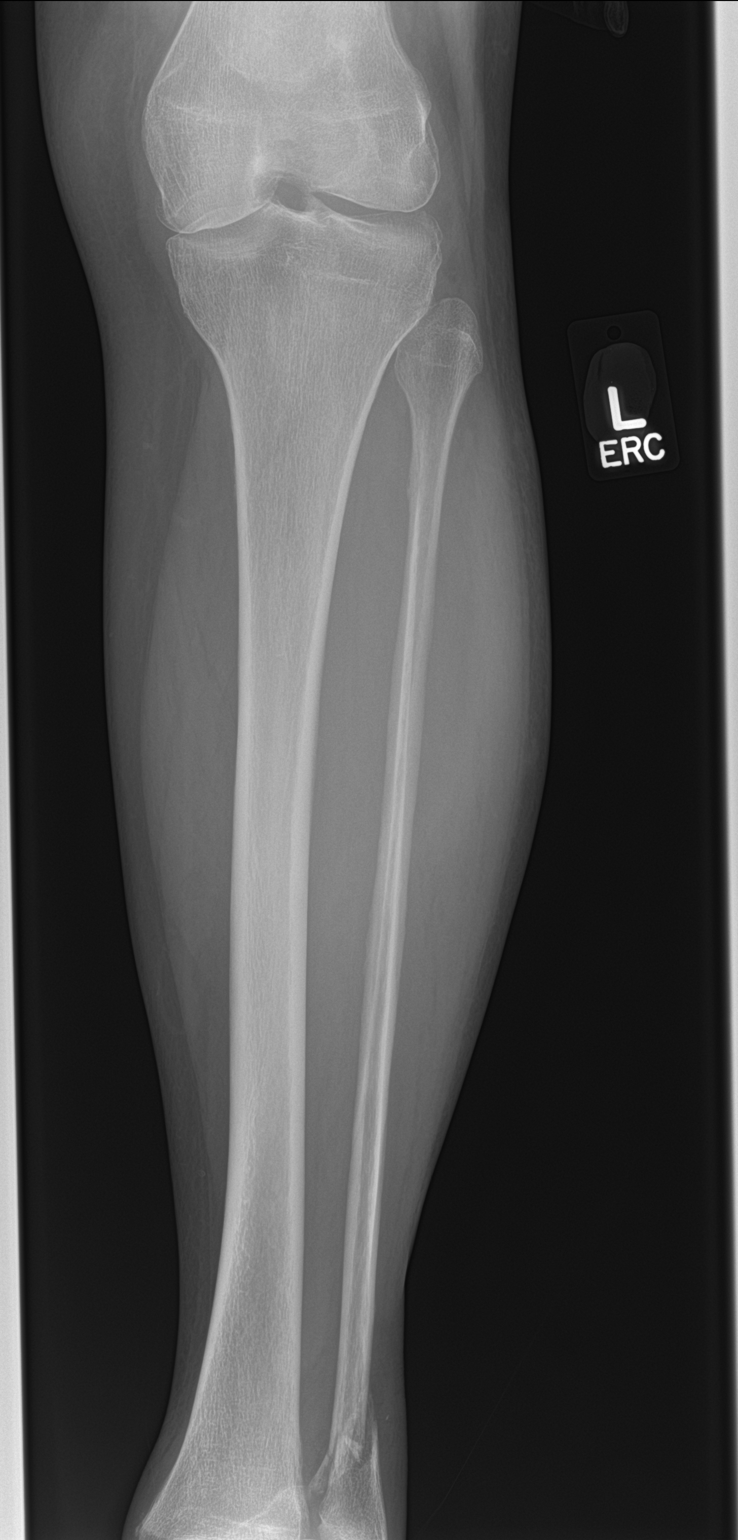

[tibia lat (1 of 2)]
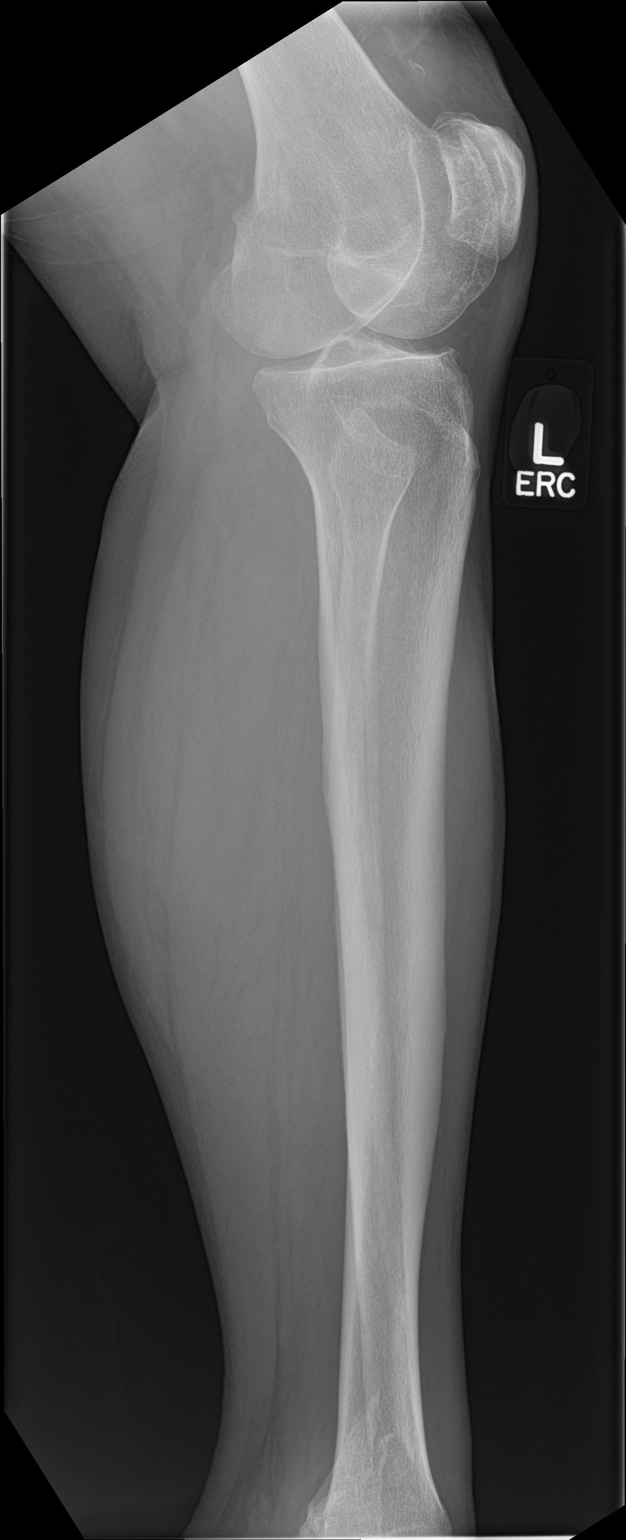

[tibia lat (2 of 2)]
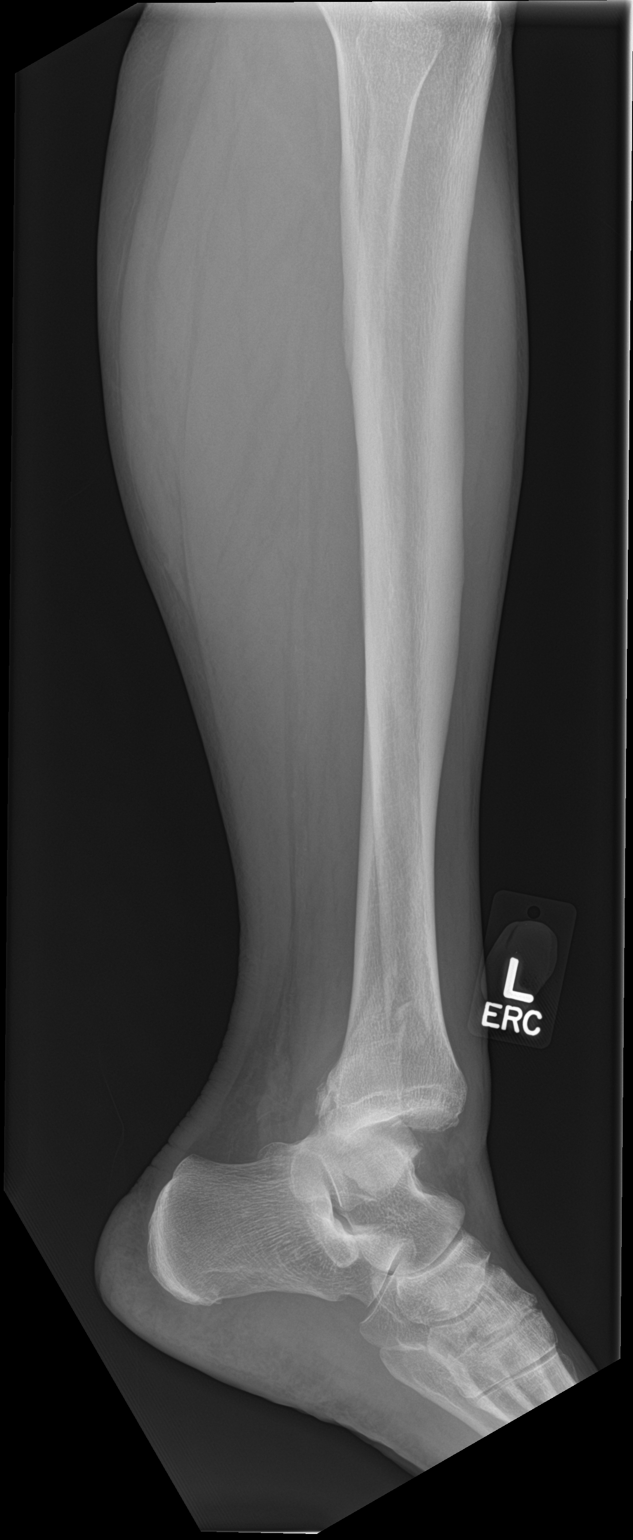

[4 of 4 positions shown; findings below may reference images not displayed]

FINDINGS: Significantly displaced bimalleolar fracture as described on report
of ankle. No other abnormalities in the tibia or fibula more
proximally.
IMPRESSION: Fracture distal tibia and fibula as described in report of ankle
radiographs.

## 2017-04-17 IMAGING — CT CT ANKLE*L* W/O CM
4 of 5 series · 15 of 33 positions shown, 17 images · non-contrast
Comparison: Left ankle radiographs performed earlier today at [DATE]
p.m.

CLINICAL DATA: Status post fall while walking downstairs, with
severe pain and twisting injury to left ankle. Initial encounter.

EXAM:
CT OF THE LEFT ANKLE WITHOUT CONTRAST
TECHNIQUE: Multidetector CT imaging of the left ankle was performed according
to the standard protocol. Multiplanar CT image reconstructions were
also generated.

[Series 6: axial bone · axial · 0.27mm/px · z∈[-217,-134]mm · 3 of 171 slices shown]
[im 43/171  bone]
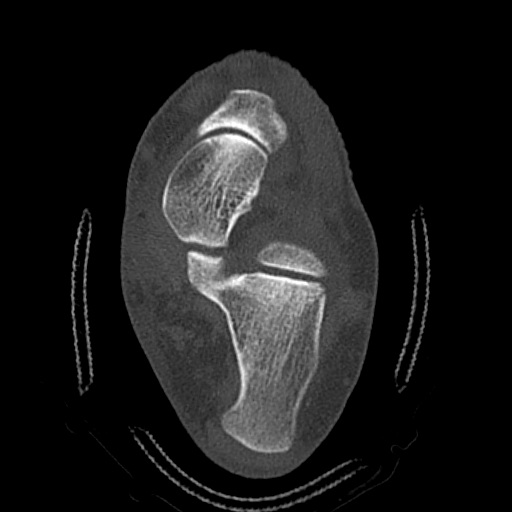
[im 86/171  bone]
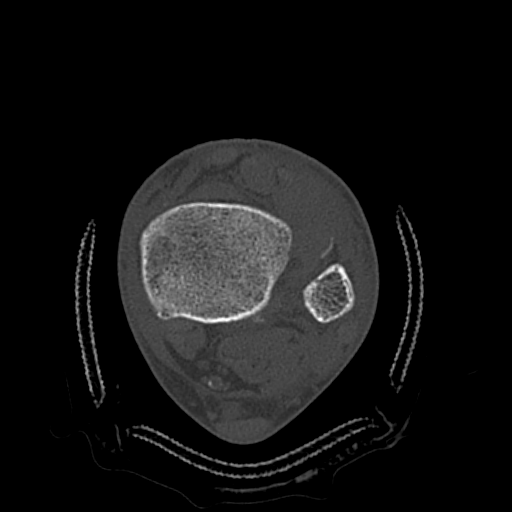
[im 128/171  bone]
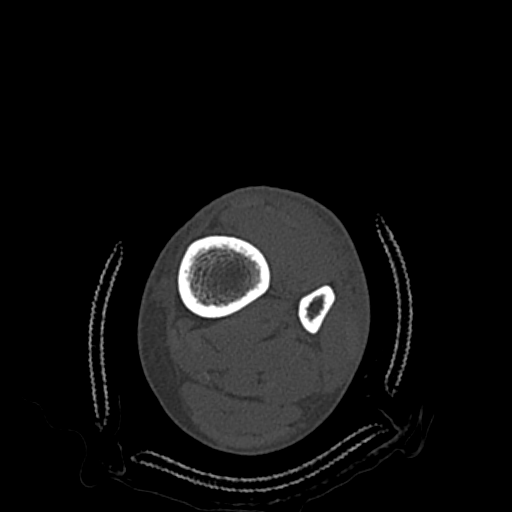

[Series 7: axial st · axial · 0.27mm/px · z∈[-238,-128]mm · 4 of 188 slices shown, 5 images]
[im 38/188  soft-tissue]
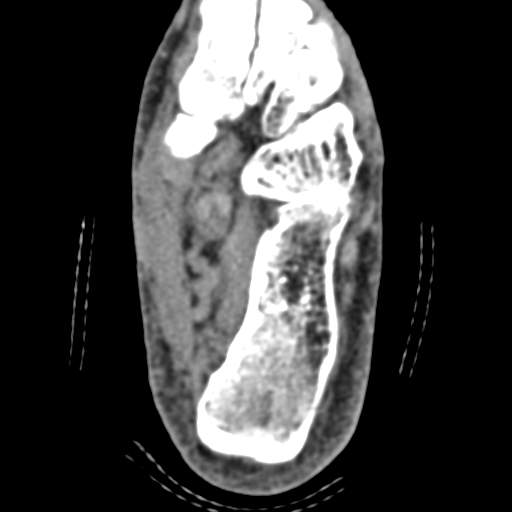
[im 38/188  bone]
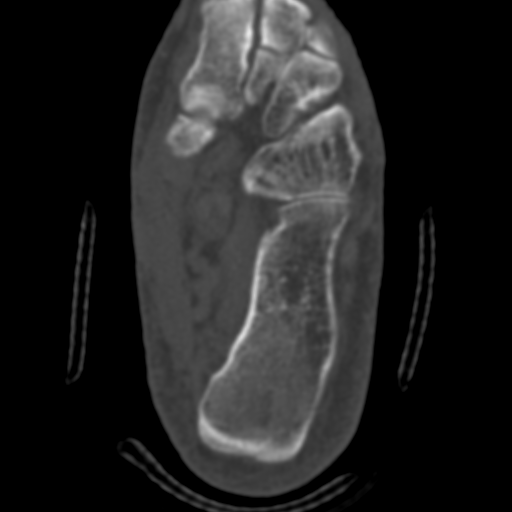
[im 75/188  bone]
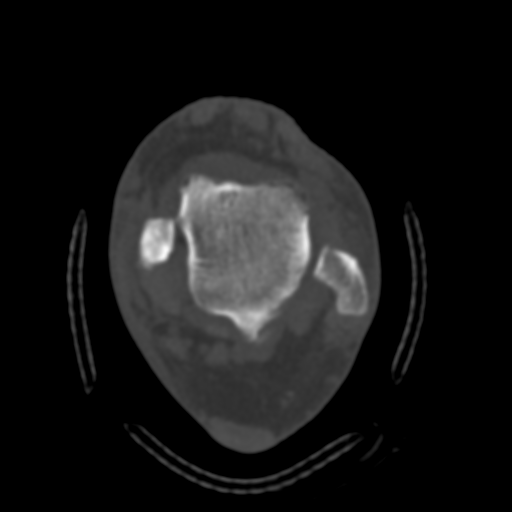
[im 113/188  bone]
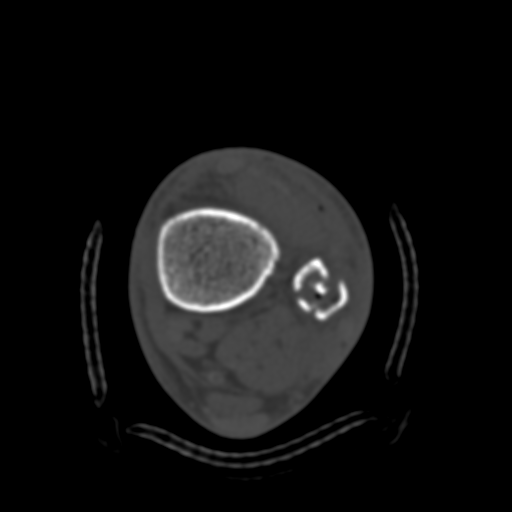
[im 150/188  bone]
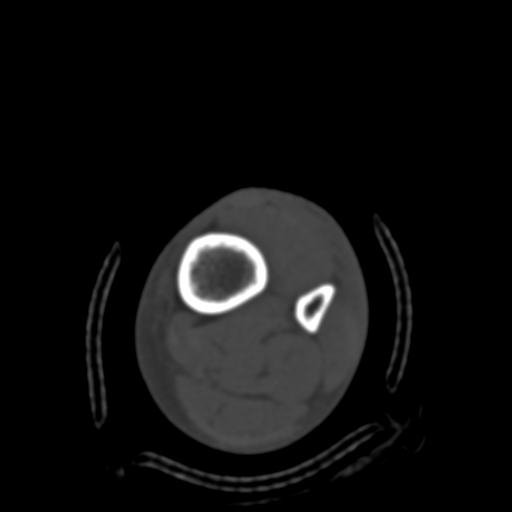

[Series 9: coronal st · coronal · 0.33mm/px · 3 of 130 slices shown]
[im 26/130  bone]
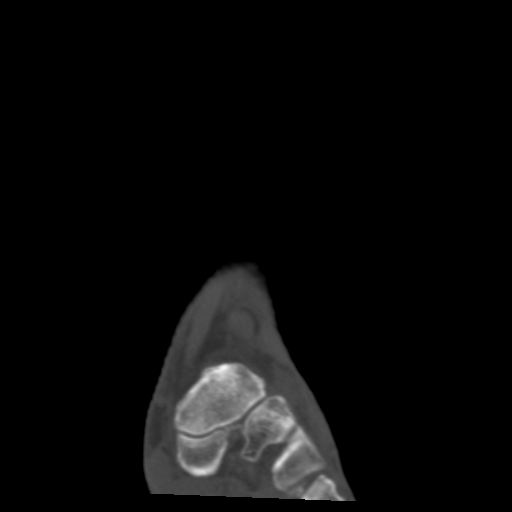
[im 52/130  bone]
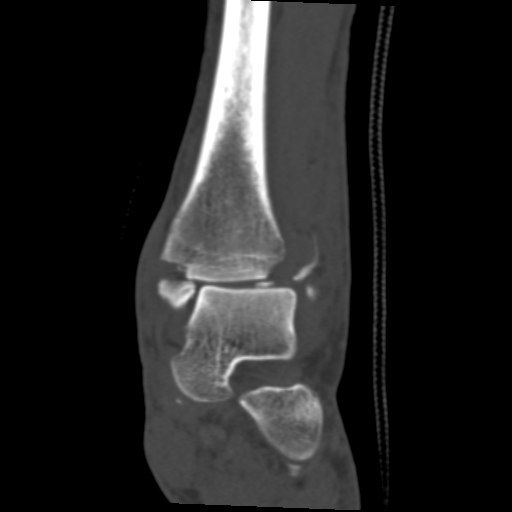
[im 78/130  bone]
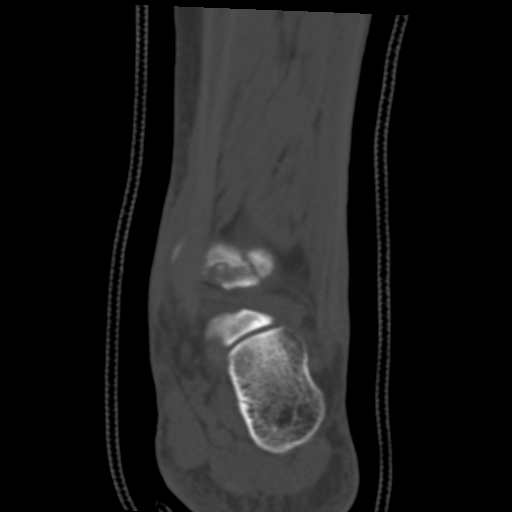

[Series 11: sag st · sagittal · 0.33mm/px · 5 of 81 slices shown, 6 images]
[im 27/81  bone]
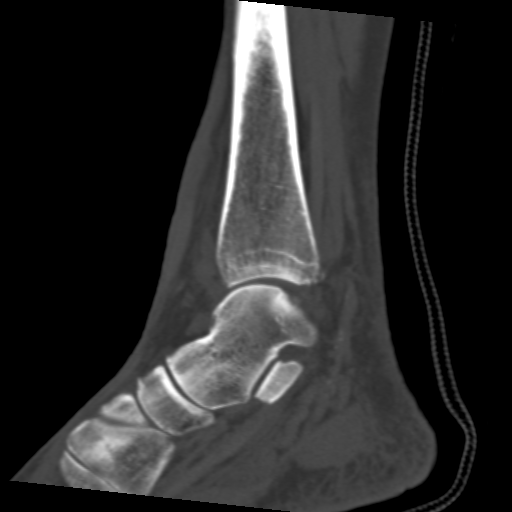
[im 34/81  bone]
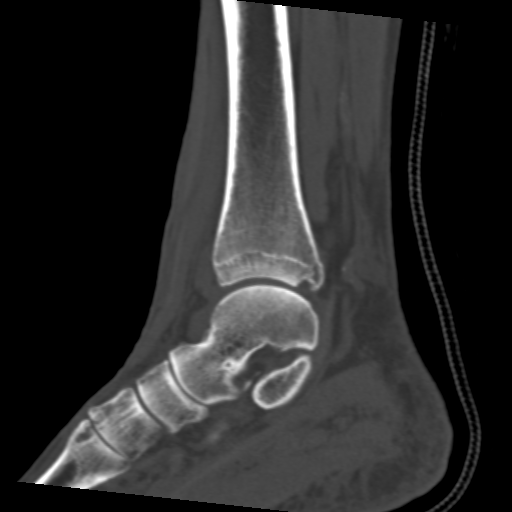
[im 41/81  soft-tissue]
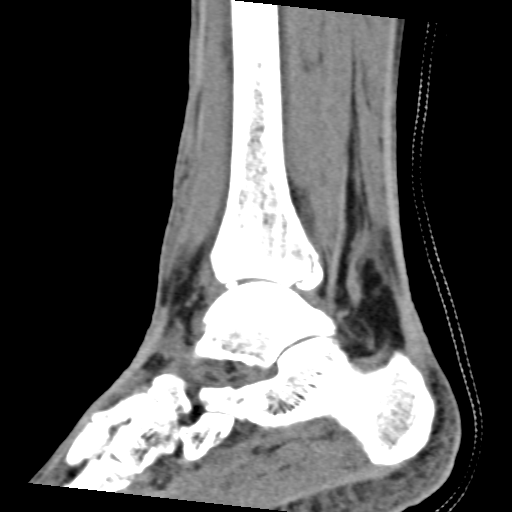
[im 41/81  bone]
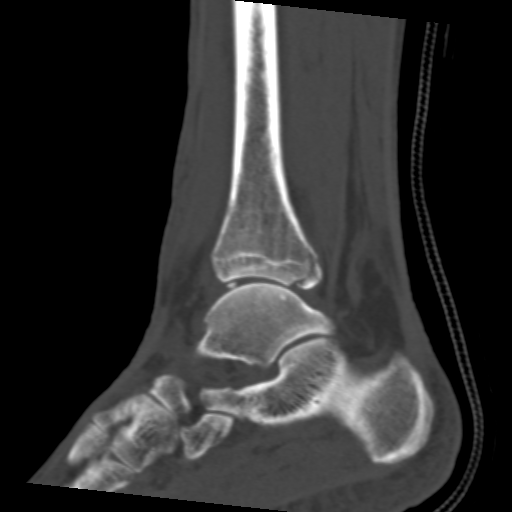
[im 47/81  bone]
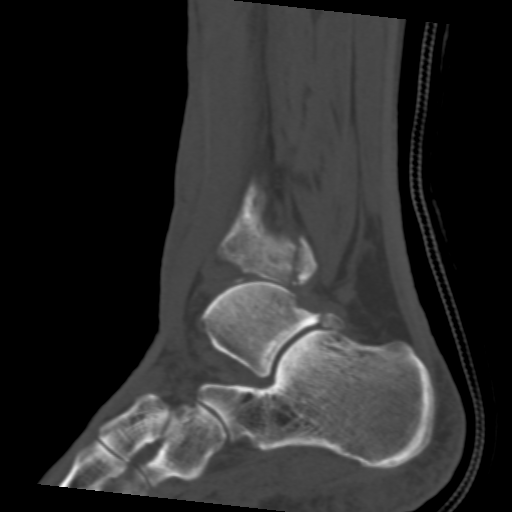
[im 54/81  bone]
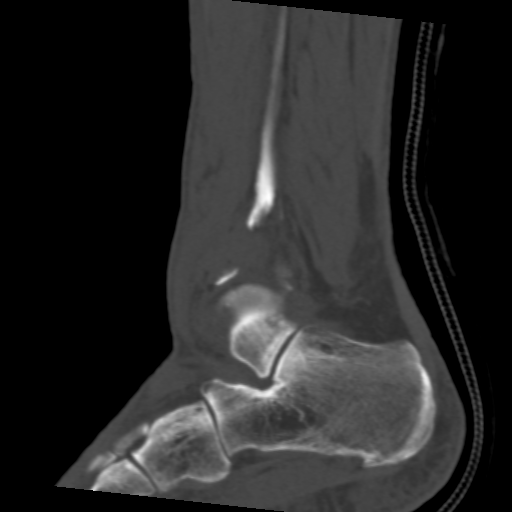

[15 of 33 positions shown; findings below may reference images not displayed]

FINDINGS: There are comminuted fractures of the medial, lateral and posterior
malleoli, with lateral displacement of the medial and lateral
malleolar fracture fragments, and minimal displacement of the
posterior malleolar fracture fragments. There also appears to be a
tiny fracture fragment within the joint space at the superior ankle
mortise, possibly arising from the lateral edge of the distal tibia.

There is associated lateral displacement and slight lateral tilt of
the talus, with marked widening of the medial ankle mortise, and
narrowing of the superior ankle mortise. A small accessory ossicle
is noted adjacent to the navicular.

Soft tissue swelling is noted about the medial and lateral aspects
of the ankle. Flexor and extensor tendons are grossly unremarkable.
The peroneal tendons appear grossly intact. Kager's fat pad is
unremarkable in appearance. A small ankle joint effusion is noted.
Mild edema is noted at the sinus tarsi.
IMPRESSION: 1. Comminuted trimalleolar fracture, with lateral displacement of
the medial and lateral malleolar fragments.
2. Tiny fracture fragment within the joint space at the superior
ankle mortise, possibly arising from the lateral edge of the distal
tibia.
3. Lateral displacement and slight lateral tilt of the talus, with
marked widening of the medial ankle mortise, and narrowing of the
superior ankle mortise.
4. Soft tissue swelling about the medial and lateral aspects of the
ankle. Small ankle joint effusion noted. Mild edema at the sinus
tarsi.

## 2017-05-06 ENCOUNTER — Ambulatory Visit
Admission: RE | Admit: 2017-05-06 | Discharge: 2017-05-06 | Disposition: A | Discharge status: Home / Self Care | Payer: Medicare HMO | Source: Ambulatory Visit | Attending: Internal Medicine | Admitting: Internal Medicine

## 2017-05-06 DIAGNOSIS — Z1231 Encounter for screening mammogram for malignant neoplasm of breast: Secondary | ICD-10-CM | POA: Diagnosis not present

## 2017-05-09 DIAGNOSIS — R69 Illness, unspecified: Secondary | ICD-10-CM | POA: Diagnosis not present

## 2017-05-21 ENCOUNTER — Inpatient Hospital Stay: Admission: RE | Admit: 2017-05-21 | Payer: Medicare HMO | Source: Ambulatory Visit

## 2017-05-31 ENCOUNTER — Telehealth: Payer: Self-pay | Admitting: Internal Medicine

## 2017-05-31 ENCOUNTER — Other Ambulatory Visit: Payer: Self-pay | Admitting: Internal Medicine

## 2017-05-31 NOTE — Telephone Encounter (Signed)
resched awv to another day than thurs

## 2017-06-03 NOTE — Telephone Encounter (Signed)
07/11/17 appt rescheduled to 07/10/17 due to program no longer available on Thursdays.

## 2017-06-21 DIAGNOSIS — N390 Urinary tract infection, site not specified: Secondary | ICD-10-CM | POA: Diagnosis not present

## 2017-06-24 ENCOUNTER — Ambulatory Visit
Admission: RE | Admit: 2017-06-24 | Discharge: 2017-06-24 | Disposition: A | Discharge status: Home / Self Care | Payer: Medicare HMO | Source: Ambulatory Visit | Attending: Internal Medicine | Admitting: Internal Medicine

## 2017-06-24 DIAGNOSIS — E2839 Other primary ovarian failure: Secondary | ICD-10-CM | POA: Insufficient documentation

## 2017-06-24 DIAGNOSIS — M85851 Other specified disorders of bone density and structure, right thigh: Secondary | ICD-10-CM | POA: Diagnosis not present

## 2017-06-24 DIAGNOSIS — Z780 Asymptomatic menopausal state: Secondary | ICD-10-CM | POA: Diagnosis not present

## 2017-06-25 DIAGNOSIS — R918 Other nonspecific abnormal finding of lung field: Secondary | ICD-10-CM | POA: Diagnosis not present

## 2017-06-25 DIAGNOSIS — Z01818 Encounter for other preprocedural examination: Secondary | ICD-10-CM | POA: Diagnosis not present

## 2017-06-25 DIAGNOSIS — D3A090 Benign carcinoid tumor of the bronchus and lung: Secondary | ICD-10-CM | POA: Diagnosis not present

## 2017-06-25 DIAGNOSIS — D3A00 Benign carcinoid tumor of unspecified site: Secondary | ICD-10-CM | POA: Diagnosis not present

## 2017-06-25 DIAGNOSIS — Z87891 Personal history of nicotine dependence: Secondary | ICD-10-CM | POA: Diagnosis not present

## 2017-06-26 HISTORY — PX: THORACOTOMY / DECORTICATION PARIETAL PLEURA: SUR1350

## 2017-07-03 DIAGNOSIS — I10 Essential (primary) hypertension: Secondary | ICD-10-CM | POA: Diagnosis not present

## 2017-07-03 DIAGNOSIS — G4700 Insomnia, unspecified: Secondary | ICD-10-CM | POA: Diagnosis not present

## 2017-07-03 DIAGNOSIS — N390 Urinary tract infection, site not specified: Secondary | ICD-10-CM | POA: Diagnosis not present

## 2017-07-03 DIAGNOSIS — G629 Polyneuropathy, unspecified: Secondary | ICD-10-CM | POA: Diagnosis not present

## 2017-07-03 DIAGNOSIS — Z85118 Personal history of other malignant neoplasm of bronchus and lung: Secondary | ICD-10-CM | POA: Diagnosis not present

## 2017-07-03 DIAGNOSIS — E785 Hyperlipidemia, unspecified: Secondary | ICD-10-CM | POA: Diagnosis not present

## 2017-07-03 DIAGNOSIS — Z809 Family history of malignant neoplasm, unspecified: Secondary | ICD-10-CM | POA: Diagnosis not present

## 2017-07-03 DIAGNOSIS — Z91041 Radiographic dye allergy status: Secondary | ICD-10-CM | POA: Diagnosis not present

## 2017-07-03 DIAGNOSIS — Z8249 Family history of ischemic heart disease and other diseases of the circulatory system: Secondary | ICD-10-CM | POA: Diagnosis not present

## 2017-07-10 ENCOUNTER — Ambulatory Visit (INDEPENDENT_AMBULATORY_CARE_PROVIDER_SITE_OTHER): Payer: Medicare HMO

## 2017-07-10 VITALS — BP 120/60 | HR 76 | Temp 98.7°F | Resp 12 | Ht 64.0 in | Wt 150.6 lb

## 2017-07-10 DIAGNOSIS — Z0000 Encounter for general adult medical examination without abnormal findings: Secondary | ICD-10-CM | POA: Diagnosis not present

## 2017-07-10 NOTE — Progress Notes (Signed)
Subjective:   Alexandra Watson is a 72 y.o. female who presents for Medicare Annual (Subsequent) preventive examination.  Review of Systems:  N/A Cardiac Risk Factors include: advanced age (>53men, >7 women);dyslipidemia;hypertension     Objective:     Vitals: BP 120/60 (BP Location: Right Arm, Patient Position: Sitting, Cuff Size: Normal)   Pulse 76   Temp 98.7 F (37.1 C) (Oral)   Resp 12   Ht 5\' 4"  (1.626 m)   Wt 150 lb 9.6 oz (68.3 kg)   SpO2 94%   BMI 25.85 kg/m   Body mass index is 25.85 kg/m.  Advanced Directives 07/10/2017 07/06/2016 08/07/2015  Does Patient Have a Medical Advance Directive? Yes Yes Yes  Type of Paramedic of [place];Living will Living will [place] in Chart? No - copy requested - -    Tobacco Social History   Tobacco Use  Smoking Status Former Smoker  . Packs/day: 0.25  . Years: 20.00  . Pack years: 5.00  . Types: Cigarettes  . Last attempt to quit: 1985  . Years since quitting: 34.3  Smokeless Tobacco Never Used  Tobacco Comment   smoking cessation materials not required     Counseling given: No Comment: smoking cessation materials not required  Clinical Intake:  Pre-visit preparation completed: Yes  Pain : No/denies pain   BMI - recorded: 27.55 Nutritional Status: BMI 25 -29 Overweight Nutritional Risks: None Diabetes: No  How often do you need to have someone help you when you read instructions, pamphlets, or other written materials from your doctor or pharmacy?: 1 - Never  Interpreter Needed?: No  Information entered by :: AEversole, LPN  Past Medical History:  Diagnosis Date  . GERD (gastroesophageal reflux disease)   . Hyperlipidemia   . Hypertension   . 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 ANKLE ARTHROSCOPY WITH OPEN REDUCTION INTERNAL FIXATION (ORIF)  07/2015     tri-malleolar  . BUNIONECTOMY Bilateral 2001, 2005  . CHOLECYSTECTOMY    . COLONOSCOPY  05/2010   normal  . GANGLION CYST EXCISION Left   . 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 CAD Brother   . Heart attack Father   . Stroke Mother   . Heart attack Brother   . Breast cancer Neg Hx    Social History   Socioeconomic History  . Marital status: Married    Spouse name: Not on file  . Number of children: 1  . Years of education: some college  . Highest education level: 12th grade  Occupational History  . Occupation: Retired  Scientific laboratory technician  . Financial resource strain: Not hard at all  . Food insecurity:    Worry: Never true    Inability: Never true  . Transportation needs:    Medical: No    Non-medical: No  Tobacco Use  . Smoking status: Former Smoker    Packs/day: 0.25    Years: 20.00    Pack years: 5.00    Types: Cigarettes    Last attempt to quit: 1985    Years since quitting: 34.3  . Smokeless tobacco: Never Used  . Tobacco comment: smoking cessation materials not required  Substance and Sexual Activity  . Alcohol use: Yes    Alcohol/week: 0.6 oz  Types: 1 Glasses of wine per week  . Drug use: No  . Sexual activity: Not Currently  Lifestyle  . Physical activity:    Days per week: 5 days    Minutes per session: 20 min  . Stress: Not at all  Relationships  . Social connections:    Talks on phone: Patient refused    Gets together: Patient refused    Attends religious service: Patient refused    Active member of club or organization: Patient refused    Attends meetings of clubs or organizations: Patient refused    Relationship status: Married  Other Topics Concern  . Not on file  Social History Narrative  . Not on file    Outpatient Encounter Medications as of 07/10/2017  Medication Sig  . Calcium Carbonate-Vit D-Min (CALCIUM 1200 PO) Take 2,400 mg by mouth.  .  CHOLECALCIFEROL PO Take 1 tablet by mouth daily.  Marland Kitchen gabapentin (NEURONTIN) 300 MG capsule Take 1 capsule (300 mg total) by mouth daily.  . hydrochlorothiazide (HYDRODIURIL) 25 MG tablet TAKE 1 TABLET DAILY  . metoprolol tartrate (LOPRESSOR) 50 MG tablet TAKE 1 TABLET DAILY  . Omega-3 Fatty Acids (FISH OIL) 1000 MG CAPS Take by mouth.  . polyethylene glycol (MIRALAX / GLYCOLAX) packet Take 17 g by mouth daily.  . Probiotic Product (BIOHM PROBIOTIC SUPPLEMENT PO) Take by mouth daily.   . simvastatin (ZOCOR) 20 MG tablet Take 1 tablet (20 mg total) daily by mouth.  . [DISCONTINUED] predniSONE (DELTASONE) 10 MG tablet Take 6 on day 1, 5 on day 2, 4 on day 3, 3 on day 4, 2 on day 5 and 1 on day 1 then stop.   No facility-administered encounter medications on file as of 07/10/2017.     Activities of Daily Living In your present state of health, do you have any difficulty performing the following activities: 07/10/2017  Hearing? N  Comment denies hearing aids  Vision? N  Comment wears eyeglasses, L cataract  Difficulty concentrating or making decisions? Y  Comment short term memory loss  Walking or climbing stairs? N  Dressing or bathing? N  Doing errands, shopping? N  Preparing Food and eating ? N  Comment denies dentures  Using the Toilet? N  In the past six months, have you accidently leaked urine? Y  Comment stress incontinence  Do you have problems with loss of bowel control? N  Managing your Medications? N  Managing your Finances? N  Housekeeping or managing your Housekeeping? N  Some recent data might be hidden    Patient Care Team: Glean Hess, MD as PCP - General (Internal Medicine) Lerry Paterson, MD as Referring Physician (Surgical Oncology) Murrell Redden, MD (Urology) Dr. Geryl Councilman (Ophthalmology) Manya Silvas, MD (Gastroenterology)    Assessment:   This is a routine wellness examination for [place].  Exercise Activities and Dietary recommendations Current  Exercise Habits: Home exercise routine, Type of exercise: walking, Time (Minutes): 20, Frequency (Times/Week): 5, Weekly Exercise (Minutes/Week): 100, Intensity: Mild, Exercise limited by: None identified  Goals    . DIET - INCREASE WATER INTAKE     Recommend to drink at least 6-8 8oz glasses of water per day.       Fall Risk Fall Risk  07/10/2017 07/06/2016 07/01/2015  Falls in the past year? Yes Yes No  Comment fell at church while walking down the stairs broke ankle August 07 2015 -  Number falls in past yr: 1 1 -  Injury with Fall?  Yes Yes -  Comment fractured ankle - -  Risk Factor Category  - High Fall Risk -  Risk for fall due to : Impaired vision - -  Risk for fall due to: Comment wears eyeglasses, L cataract - -  Follow up Falls evaluation completed;Education provided;Falls prevention discussed - -   FALL RISK PREVENTION PERTAINING TO HOME: Is your home free of loose throw rugs in walkways, pet beds, electrical cords, etc? Yes Is there adequate lighting in your home to reduce risk of falls?  Yes Are there stairs in or around your home WITH handrails? Yes  ASSISTIVE DEVICES UTILIZED TO PREVENT FALLS: Use of a cane, walker or w/c? No Grab bars in the bathroom? No  Shower chair or a place to sit while bathing? Yes An elevated toilet seat or a handicapped toilet? Yes  Timed Get Up and Go Performed: Yes. Pt ambulated 10 feet within 7 sec. Gait stead-fast and without the use of an assistive device. No intervention required at this time. Fall risk prevention has been discussed.  Community Resource Referral:  Pt declined my offer to send [employer] Referral to Care Guide for installation of grab bars in the shower.  Depression Screen PHQ 2/9 Scores 07/06/2016 07/01/2015  PHQ - 2 Score 0 1  PHQ- 9 Score - 2     Cognitive Function     6CIT Screen 07/10/2017 07/06/2016  What Year? 0 points 0 points  What month? 0 points 0 points  What time? 0 points 0 points  Count back  from 20 0 points 0 points  Months in reverse 0 points 0 points  Repeat phrase 0 points 0 points  Total Score 0 0    Immunization History  Administered Date(s) Administered  . Influenza, High Dose Seasonal PF 11/25/2014, 11/24/2015, 11/22/2016  . Pneumococcal Conjugate-13 02/02/2013  . Pneumococcal Polysaccharide-23 11/16/2010, 02/03/2014  . Tdap 06/10/2009    Qualifies for Shingles Vaccine? Yes. Due for Shingrix. Education has been provided regarding the importance of this vaccine. Pt has been advised to call her insurance company to determine her out of pocket expense. Advised she may also receive this vaccine at her local pharmacy or Health Dept. Verbalized acceptance and understanding.  Screening Tests Health Maintenance  Topic Date Due  . INFLUENZA VACCINE  09/26/2017  . MAMMOGRAM  05/07/2018  . TETANUS/TDAP  06/11/2019  . COLONOSCOPY  05/27/2020  . DEXA SCAN  Completed  . PNA vac Low Risk Adult  Completed  . Hepatitis C Screening  Addressed    Cancer Screenings: Lung: Low Dose CT Chest recommended if Age 23-80 years, 30 pack-year currently smoking OR have quit w/in 15years. Patient does not qualify. Breast:  Up to date on Mammogram? Yes. Completed 05/06/17. Repeat every year   Up to date of Bone Density/Dexa? Yes. Completed 06/24/17. Osteoporotic screening no longer required Colorectal: Completed 05/28/10. Repeat every 10 years  Additional Screenings: Hepatitis C Screening: Completed 02/03/14    Plan:  I have personally reviewed and addressed the Medicare Annual Wellness questionnaire and have noted the following in the patient's chart:  A. Medical and social history B. Use of alcohol, tobacco or illicit drugs  C. Current medications and supplements D. Functional ability and status E.  Nutritional status F.  Physical activity G. Advance directives H. List of other physicians I.  Hospitalizations, surgeries, and ER visits in previous 12 months J.  [place]  such as hearing and vision if needed, cognitive and depression L. Referrals  and appointments  In addition, I have reviewed and discussed with patient certain preventive protocols, quality metrics, and best practice recommendations. A written personalized care plan for preventive services as well as general preventive health recommendations were provided to patient.  Signed,  Aleatha Borer, LPN Nurse Health Advisor  MD Recommendations: Due for Shingrix. Education has been provided regarding the importance of this vaccine. Pt has been advised to call her insurance company to determine her out of pocket expense. Advised she may also receive this vaccine at her local pharmacy or Health Dept. Verbalized acceptance and understanding.

## 2017-07-10 NOTE — Patient Instructions (Signed)
Alexandra Watson , Thank you for taking time to come for your Medicare Wellness Visit. I appreciate your ongoing commitment to your health goals. Please review the following plan we discussed and let me know if I can assist you in the future.   Screening recommendations/referrals: Colorectal Screening: Completed 05/28/10. Repeat every 10 years Mammogram: Completed 05/06/17. Repeat every year Bone Density: Completed 06/24/17. Osteoporotic screening no longer required  Vision and Dental Exams: Recommended annual ophthalmology exams for early detection of glaucoma and other disorders of the eye Recommended annual dental exams for proper oral hygiene  Vaccinations: Influenza vaccine: Up to date Pneumococcal vaccine: Completed series Tdap vaccine: Up to date Shingles vaccine: Please call your insurance company to determine your out of pocket expense for the Shingrix vaccine. You may also receive this vaccine at your local pharmacy or Health Dept.  Advanced directives: Please bring a copy of your POA (Power of Attorney) and/or Living Will to your next appointment.  Conditions/risks identified: Recommend to drink at least 6-8 8oz glasses of water per day.  Next appointment: Please schedule your Annual Wellness Visit with your Nurse Health Advisor in one year.  Preventive Care 75 Years and Older, Female Preventive care refers to lifestyle choices and visits with your health care provider that can promote health and wellness. What does preventive care include?  A yearly physical exam. This is also called an annual well check.  Dental exams once or twice a year.  Routine eye exams. Ask your health care provider how often you should have your eyes checked.  Personal lifestyle choices, including:  Daily care of your teeth and gums.  Regular physical activity.  Eating a healthy diet.  Avoiding tobacco and drug use.  Limiting alcohol use.  Practicing safe sex.  Taking low-dose aspirin every  day.  Taking vitamin and mineral supplements as recommended by your health care provider. What happens during an annual well check? The services and screenings done by your health care provider during your annual well check will depend on your age, overall health, lifestyle risk factors, and family history of disease. Counseling  Your health care provider may ask you questions about your:  Alcohol use.  Tobacco use.  Drug use.  Emotional well-being.  Home and relationship well-being.  Sexual activity.  Eating habits.  History of falls.  Memory and ability to understand (cognition).  Work and work Statistician.  Reproductive health. Screening  You may have the following tests or measurements:  Height, weight, and BMI.  Blood pressure.  Lipid and cholesterol levels. These may be checked every 5 years, or more frequently if you are over 74 years old.  Skin check.  Lung cancer screening. You may have this screening every year starting at age 47 if you have a 30-pack-year history of smoking and currently smoke or have quit within the past 15 years.  Fecal occult blood test (FOBT) of the stool. You may have this test every year starting at age 70.  Flexible sigmoidoscopy or colonoscopy. You may have a sigmoidoscopy every 5 years or a colonoscopy every 10 years starting at age 14.  Hepatitis C blood test.  Hepatitis B blood test.  Sexually transmitted disease (STD) testing.  Diabetes screening. This is done by checking your blood sugar (glucose) after you have not eaten for a while (fasting). You may have this done every 1-3 years.  Bone density scan. This is done to screen for osteoporosis. You may have this done starting at age 35.  Mammogram. This may be done every 1-2 years. Talk to your health care provider about how often you should have regular mammograms. Talk with your health care provider about your test results, treatment options, and if necessary, the need  for more tests. Vaccines  Your health care provider may recommend certain vaccines, such as:  Influenza vaccine. This is recommended every year.  Tetanus, diphtheria, and acellular pertussis (Tdap, Td) vaccine. You may need a Td booster every 10 years.  Zoster vaccine. You may need this after age 26.  Pneumococcal 13-valent conjugate (PCV13) vaccine. One dose is recommended after age 28.  Pneumococcal polysaccharide (PPSV23) vaccine. One dose is recommended after age 72. Talk to your health care provider about which screenings and vaccines you need and how often you need them. This information is not intended to replace advice given to you by your health care provider. Make sure you discuss any questions you have with your health care provider. Document Released: 03/11/2015 Document Revised: 11/02/2015 Document Reviewed: 12/14/2014 Elsevier Interactive Patient Education  2017 [employer] Prevention in the Home Falls can cause injuries. They can happen to people of all ages. There are many things you can do to make your home safe and to help prevent falls. What can I do on the outside of my home?  Regularly fix the edges of walkways and driveways and fix any cracks.  Remove anything that might make you trip as you walk through a door, such as a raised step or threshold.  Trim any bushes or trees on the path to your home.  Use bright outdoor lighting.  Clear any walking paths of anything that might make someone trip, such as rocks or tools.  Regularly check to see if handrails are loose or broken. Make sure that both sides of any steps have handrails.  Any raised decks and porches should have guardrails on the edges.  Have any leaves, snow, or ice cleared regularly.  Use sand or salt on walking paths during winter.  Clean up any spills in your garage right away. This includes oil or grease spills. What can I do in the bathroom?  Use night lights.  Install grab bars  by the toilet and in the tub and shower. Do not use towel bars as grab bars.  Use non-skid mats or decals in the tub or shower.  If you need to sit down in the shower, use a plastic, non-slip stool.  Keep the floor dry. Clean up any water that spills on the floor as soon as it happens.  Remove soap buildup in the tub or shower regularly.  Attach bath mats securely with double-sided non-slip rug tape.  Do not have throw rugs and other things on the floor that can make you trip. What can I do in the bedroom?  Use night lights.  Make sure that you have a light by your bed that is easy to reach.  Do not use any sheets or blankets that are too big for your bed. They should not hang down onto the floor.  Have a firm chair that has side arms. You can use this for support while you get dressed.  Do not have throw rugs and other things on the floor that can make you trip. What can I do in the kitchen?  Clean up any spills right away.  Avoid walking on wet floors.  Keep items that you use a lot in easy-to-reach places.  If you need to reach  something above you, use a strong step stool that has a grab bar.  Keep electrical cords out of the way.  Do not use floor polish or wax that makes floors slippery. If you must use wax, use non-skid floor wax.  Do not have throw rugs and other things on the floor that can make you trip. What can I do with my stairs?  Do not leave any items on the stairs.  Make sure that there are handrails on both sides of the stairs and use them. Fix handrails that are broken or loose. Make sure that handrails are as long as the stairways.  Check any carpeting to make sure that it is firmly attached to the stairs. Fix any carpet that is loose or worn.  Avoid having throw rugs at the top or bottom of the stairs. If you do have throw rugs, attach them to the floor with carpet tape.  Make sure that you have a light switch at the top of the stairs and the  bottom of the stairs. If you do not have them, ask someone to add them for you. What else can I do to help prevent falls?  Wear shoes that:  Do not have high heels.  Have rubber bottoms.  Are comfortable and fit you well.  Are closed at the toe. Do not wear sandals.  If you use a stepladder:  Make sure that it is fully opened. Do not climb a closed stepladder.  Make sure that both sides of the stepladder are locked into place.  Ask someone to hold it for you, if possible.  Clearly mark and make sure that you can see:  Any grab bars or handrails.  First and last steps.  Where the edge of each step is.  Use tools that help you move around (mobility aids) if they are needed. These include:  Canes.  Walkers.  Scooters.  Crutches.  Turn on the lights when you go into a dark area. Replace any light bulbs as soon as they burn out.  Set up your furniture so you have a clear path. Avoid moving your furniture around.  If any of your floors are uneven, fix them.  If there are any pets around you, be aware of where they are.  Review your medicines with your doctor. Some medicines can make you feel dizzy. This can increase your chance of falling. Ask your doctor what other things that you can do to help prevent falls. This information is not intended to replace advice given to you by your health care provider. Make sure you discuss any questions you have with your health care provider. Document Released: 12/09/2008 Document Revised: 07/21/2015 Document Reviewed: 03/19/2014 Elsevier Interactive Patient Education  2017 [employer].

## 2017-07-11 ENCOUNTER — Ambulatory Visit: Payer: Medicare HMO

## 2017-07-15 DIAGNOSIS — R350 Frequency of micturition: Secondary | ICD-10-CM | POA: Diagnosis not present

## 2017-07-19 ENCOUNTER — Encounter: Payer: Medicare HMO | Admitting: Internal Medicine

## 2017-07-19 DIAGNOSIS — I10 Essential (primary) hypertension: Secondary | ICD-10-CM | POA: Diagnosis not present

## 2017-07-19 DIAGNOSIS — M1990 Unspecified osteoarthritis, unspecified site: Secondary | ICD-10-CM | POA: Diagnosis not present

## 2017-07-19 DIAGNOSIS — C7B09 Secondary carcinoid tumors of other sites: Secondary | ICD-10-CM | POA: Diagnosis not present

## 2017-07-19 DIAGNOSIS — Z7982 Long term (current) use of aspirin: Secondary | ICD-10-CM | POA: Diagnosis not present

## 2017-07-19 DIAGNOSIS — G8929 Other chronic pain: Secondary | ICD-10-CM | POA: Diagnosis not present

## 2017-07-19 DIAGNOSIS — C7A090 Malignant carcinoid tumor of the bronchus and lung: Secondary | ICD-10-CM | POA: Diagnosis not present

## 2017-07-19 DIAGNOSIS — Z87891 Personal history of nicotine dependence: Secondary | ICD-10-CM | POA: Diagnosis not present

## 2017-07-19 DIAGNOSIS — E785 Hyperlipidemia, unspecified: Secondary | ICD-10-CM | POA: Diagnosis not present

## 2017-07-19 DIAGNOSIS — N390 Urinary tract infection, site not specified: Secondary | ICD-10-CM | POA: Diagnosis not present

## 2017-07-19 DIAGNOSIS — K5900 Constipation, unspecified: Secondary | ICD-10-CM | POA: Diagnosis not present

## 2017-07-19 DIAGNOSIS — C3490 Malignant neoplasm of unspecified part of unspecified bronchus or lung: Secondary | ICD-10-CM | POA: Diagnosis not present

## 2017-07-19 DIAGNOSIS — M8580 Other specified disorders of bone density and structure, unspecified site: Secondary | ICD-10-CM | POA: Diagnosis not present

## 2017-07-19 DIAGNOSIS — J9811 Atelectasis: Secondary | ICD-10-CM | POA: Diagnosis not present

## 2017-07-19 DIAGNOSIS — Z4682 Encounter for fitting and adjustment of non-vascular catheter: Secondary | ICD-10-CM | POA: Diagnosis not present

## 2017-07-19 DIAGNOSIS — K219 Gastro-esophageal reflux disease without esophagitis: Secondary | ICD-10-CM | POA: Diagnosis not present

## 2017-07-19 DIAGNOSIS — D649 Anemia, unspecified: Secondary | ICD-10-CM | POA: Diagnosis not present

## 2017-07-19 DIAGNOSIS — C3412 Malignant neoplasm of upper lobe, left bronchus or lung: Secondary | ICD-10-CM | POA: Diagnosis not present

## 2017-07-20 DIAGNOSIS — M8580 Other specified disorders of bone density and structure, unspecified site: Secondary | ICD-10-CM | POA: Diagnosis not present

## 2017-07-20 DIAGNOSIS — E785 Hyperlipidemia, unspecified: Secondary | ICD-10-CM | POA: Diagnosis not present

## 2017-07-20 DIAGNOSIS — I10 Essential (primary) hypertension: Secondary | ICD-10-CM | POA: Diagnosis not present

## 2017-07-20 DIAGNOSIS — K5900 Constipation, unspecified: Secondary | ICD-10-CM | POA: Diagnosis not present

## 2017-07-20 DIAGNOSIS — D649 Anemia, unspecified: Secondary | ICD-10-CM | POA: Diagnosis not present

## 2017-07-20 DIAGNOSIS — M1990 Unspecified osteoarthritis, unspecified site: Secondary | ICD-10-CM | POA: Diagnosis not present

## 2017-07-20 DIAGNOSIS — K219 Gastro-esophageal reflux disease without esophagitis: Secondary | ICD-10-CM | POA: Diagnosis not present

## 2017-07-20 DIAGNOSIS — C7B09 Secondary carcinoid tumors of other sites: Secondary | ICD-10-CM | POA: Diagnosis not present

## 2017-07-20 DIAGNOSIS — J9811 Atelectasis: Secondary | ICD-10-CM | POA: Diagnosis not present

## 2017-07-20 DIAGNOSIS — C3490 Malignant neoplasm of unspecified part of unspecified bronchus or lung: Secondary | ICD-10-CM | POA: Diagnosis not present

## 2017-07-20 DIAGNOSIS — G8929 Other chronic pain: Secondary | ICD-10-CM | POA: Diagnosis not present

## 2017-07-20 DIAGNOSIS — Z7982 Long term (current) use of aspirin: Secondary | ICD-10-CM | POA: Diagnosis not present

## 2017-08-08 DIAGNOSIS — G4700 Insomnia, unspecified: Secondary | ICD-10-CM | POA: Diagnosis not present

## 2017-08-08 DIAGNOSIS — Z87891 Personal history of nicotine dependence: Secondary | ICD-10-CM | POA: Diagnosis not present

## 2017-08-08 DIAGNOSIS — R918 Other nonspecific abnormal finding of lung field: Secondary | ICD-10-CM | POA: Diagnosis not present

## 2017-08-08 DIAGNOSIS — C7A090 Malignant carcinoid tumor of the bronchus and lung: Secondary | ICD-10-CM | POA: Diagnosis not present

## 2017-08-08 DIAGNOSIS — D3A090 Benign carcinoid tumor of the bronchus and lung: Secondary | ICD-10-CM | POA: Diagnosis not present

## 2017-08-16 DIAGNOSIS — G4700 Insomnia, unspecified: Secondary | ICD-10-CM | POA: Diagnosis not present

## 2017-08-16 DIAGNOSIS — C7A090 Malignant carcinoid tumor of the bronchus and lung: Secondary | ICD-10-CM | POA: Diagnosis not present

## 2017-08-16 DIAGNOSIS — R911 Solitary pulmonary nodule: Secondary | ICD-10-CM | POA: Diagnosis not present

## 2017-08-16 DIAGNOSIS — Z87891 Personal history of nicotine dependence: Secondary | ICD-10-CM | POA: Diagnosis not present

## 2017-09-04 DIAGNOSIS — C782 Secondary malignant neoplasm of pleura: Secondary | ICD-10-CM | POA: Diagnosis not present

## 2017-09-04 DIAGNOSIS — C3492 Malignant neoplasm of unspecified part of left bronchus or lung: Secondary | ICD-10-CM | POA: Diagnosis not present

## 2017-09-13 ENCOUNTER — Other Ambulatory Visit: Payer: Self-pay | Admitting: Internal Medicine

## 2017-10-11 ENCOUNTER — Encounter: Payer: Self-pay | Admitting: Internal Medicine

## 2017-10-11 ENCOUNTER — Ambulatory Visit: Payer: Medicare HMO | Admitting: Internal Medicine

## 2017-10-11 ENCOUNTER — Ambulatory Visit (INDEPENDENT_AMBULATORY_CARE_PROVIDER_SITE_OTHER): Payer: Medicare HMO | Admitting: Internal Medicine

## 2017-10-11 VITALS — BP 132/66 | HR 75 | Ht 64.0 in | Wt 138.2 lb

## 2017-10-11 DIAGNOSIS — R634 Abnormal weight loss: Secondary | ICD-10-CM | POA: Insufficient documentation

## 2017-10-11 DIAGNOSIS — Z859 Personal history of malignant neoplasm, unspecified: Secondary | ICD-10-CM

## 2017-10-11 DIAGNOSIS — I10 Essential (primary) hypertension: Secondary | ICD-10-CM | POA: Diagnosis not present

## 2017-10-11 DIAGNOSIS — R69 Illness, unspecified: Secondary | ICD-10-CM | POA: Diagnosis not present

## 2017-10-11 DIAGNOSIS — F418 Other specified anxiety disorders: Secondary | ICD-10-CM

## 2017-10-11 MED ORDER — SERTRALINE HCL 25 MG PO TABS: 25.0000 mg | ORAL_TABLET | Freq: Every day | ORAL | 1 refills | Status: DC

## 2017-10-11 NOTE — Progress Notes (Signed)
Date:  10/11/2017   Name:  Alexandra Watson   DOB:  20-Jun-1945   MRN:  [id number]   Chief Complaint: Anxiety (Cancer has came back. Anxious. Go back to oncology in Sept and having a Catscan. GAD7- 22. )  Anxiety  Presents for initial visit. The problem has been unchanged. Symptoms include excessive worry, insomnia and nervous/anxious behavior. Patient reports no chest pain, decreased concentration, depressed mood, dizziness, irritability, palpitations, restlessness or shortness of breath. Symptoms occur constantly. The severity of symptoms is causing significant distress.   Past treatments include non-benzodiazephine anxiolytics. Compliance with prior treatments has been good.  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  She has recovered well from the surgery but is much more anxious and can not seem to get her mind off of it.  She especially anxious because her daughter had lung cancer and suffered terribly with chemo and XRT. She was released to follow up with [facility] in September. She is not eating well - has lost more weight.  She has tried smoothies and ensure but usually as a meal replacement rather than a snack.   Review of Systems  Constitutional: Positive for unexpected weight change. Negative for chills, fatigue, fever and irritability.  Eyes: Negative for visual disturbance.  Respiratory: Negative for chest tightness, shortness of breath and wheezing.   Cardiovascular: Negative for chest pain and palpitations.  Gastrointestinal: Negative for abdominal pain.  Genitourinary: Negative for dysuria.  Neurological: Negative for dizziness, light-headedness and headaches.  Psychiatric/Behavioral: Negative for decreased concentration. The patient is nervous/anxious and has insomnia.     Patient Active Problem List   Diagnosis Date Noted  . Neuropathy 07/06/2016  . SUI (stress urinary incontinence, female) 11/11/2015  . Incomplete emptying of bladder  11/11/2015  . Recurrent UTI 11/10/2015  . Fracture of ankle, trimalleolar, left, closed 08/08/2015  . Arthritis of knee, left 07/01/2015  . Insomnia 06/30/2015  . Atrophic vaginitis 06/30/2015  . Gastroesophageal reflux disease 06/30/2015  . Osteopenia 05/20/2015  . Anemia 09/14/2012  . Constipation 08/27/2012  . Essential hypertension 10/04/2011  . Hyperlipidemia, mixed 10/04/2011  . History of malignant carcinoid tumor 10/04/2011    Allergies  Allergen Reactions  . Iodinated Diagnostic Agents Hives  . Oxycodone Nausea And Vomiting and Nausea Only    Past Surgical History:  Procedure Laterality Date  . ANKLE ARTHROSCOPY WITH OPEN REDUCTION INTERNAL FIXATION (ORIF)  07/2015   tri-malleolar  . BUNIONECTOMY Bilateral 2001, 2005  . CHOLECYSTECTOMY    . COLONOSCOPY  05/2010   normal  . GANGLION CYST EXCISION Left   . 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 Smoking status: Former Smoker    Packs/day: 0.25    Years: 20.00    Pack years: 5.00    Types: Cigarettes    Last attempt to quit: 1985    Years since quitting: 34.6  . Smokeless tobacco: Never Used  . Tobacco comment: smoking cessation materials not required  Substance Use Topics  . Alcohol use: Yes    Alcohol/week: 1.0 standard drinks    Types: 1 Glasses of wine per week  . Drug use: No     Medication list has been reviewed and updated.  Current Meds  Medication Sig  . Calcium Carbonate-Vit D-Min (CALCIUM 1200 PO) Take 2,400 mg by mouth.  . CHOLECALCIFEROL PO Take 1 tablet by mouth daily.  Marland Kitchen gabapentin (NEURONTIN)  300 MG capsule TAKE 1 CAPSULE DAILY  . hydrochlorothiazide (HYDRODIURIL) 25 MG tablet TAKE 1 TABLET DAILY  . metoprolol tartrate (LOPRESSOR) 50 MG tablet TAKE 1 TABLET DAILY (Patient taking differently: 25 mg. )  . Omega-3 Fatty Acids (FISH OIL) 1000 MG CAPS Take by mouth.  . polyethylene glycol (MIRALAX / GLYCOLAX) packet Take 17 g by  mouth daily.  . Probiotic Product (BIOHM PROBIOTIC SUPPLEMENT PO) Take by mouth daily.   . simvastatin (ZOCOR) 20 MG tablet TAKE 1 TABLET DAILY  . traZODone (DESYREL) 50 MG tablet Take 50 mg by mouth at bedtime.    PHQ 2/9 Scores 10/11/2017 07/06/2016 07/01/2015  PHQ - 2 Score 1 0 1  PHQ- 9 Score - - 2   GAD 7 : Generalized Anxiety Score 10/11/2017  Nervous, Anxious, on Edge 3  Control/stop worrying 3  Worry too much - different things 3  Trouble relaxing 3  Restless 3  Easily annoyed or irritable 0  Afraid - awful might happen 3  Total GAD 7 Score 18  Anxiety Difficulty Extremely difficult      Physical Exam  Constitutional: She is oriented to person, place, and time. She appears well-developed. No distress.  HENT:  Head: Normocephalic and atraumatic.  Cardiovascular: Normal rate, regular rhythm and normal heart sounds.  Pulmonary/Chest: Effort normal. No respiratory distress.  Musculoskeletal: Normal range of motion.  Neurological: She is alert and oriented to person, place, and time.  Skin: Skin is warm and dry. No rash noted.  Psychiatric: She has a normal mood and affect. Her behavior is normal. Thought content normal.  Nursing note and vitals reviewed.  Wt Readings from Last 3 Encounters:  10/11/17 138 lb 3.2 oz (62.7 kg)  07/10/17 150 lb 9.6 oz (68.3 kg)  03/29/17 158 lb (71.7 kg)    BP 132/66   Pulse 75   Ht 5\' 4"  (1.626 m)   Wt 150 lb (68 kg)   SpO2 99%   BMI 25.75 kg/m   Assessment and Plan: 1. Anxiety about health Monitor weight, change in mood, etc Follow up next month - sertraline (ZOLOFT) 25 MG tablet; Take 1 tablet (25 mg total) by mouth daily.  Dispense: 30 tablet; Refill: 1  2. Essential hypertension controlled  3. History of malignant carcinoid tumor Follow up at [facility]  4. Weight loss, abnormal Check weights 2 times per week Ensure or other high protein snack in between meals   Meds ordered this encounter  Medications  . sertraline  (ZOLOFT) 25 MG tablet    Sig: Take 1 tablet (25 mg total) by mouth daily.    Dispense:  30 tablet    Refill:  1    Partially dictated using Editor, commissioning. Any errors are unintentional.  Halina Maidens, MD [facility]  10/11/2017

## 2017-10-11 NOTE — Patient Instructions (Signed)
Orthopedic hand specialist for thumb/finger issues

## 2017-10-15 ENCOUNTER — Encounter: Payer: Self-pay | Admitting: Internal Medicine

## 2017-10-15 ENCOUNTER — Ambulatory Visit (INDEPENDENT_AMBULATORY_CARE_PROVIDER_SITE_OTHER): Payer: Medicare HMO | Admitting: Internal Medicine

## 2017-10-15 ENCOUNTER — Other Ambulatory Visit: Payer: Self-pay | Admitting: Internal Medicine

## 2017-10-15 VITALS — BP 142/70 | HR 73 | Ht 64.0 in | Wt 139.3 lb

## 2017-10-15 DIAGNOSIS — N3000 Acute cystitis without hematuria: Secondary | ICD-10-CM

## 2017-10-15 LAB — POC URINALYSIS WITH MICROSCOPIC (NON AUTO)MANUAL RESULT
Bilirubin, UA: NEGATIVE
Crystals: 0
Epithelial cells, urine per micros: 0
Glucose, UA: NEGATIVE
Ketones, UA: NEGATIVE
MUCUS UA: 0
Nitrite, UA: NEGATIVE
PH UA: 7 (ref 5.0–8.0)
Protein, UA: NEGATIVE
RBC: 3 M/uL — AB (ref 4.04–5.48)
Urobilinogen, UA: 0.2 E.U./dL
WBC Casts, UA: 20

## 2017-10-15 MED ORDER — NITROFURANTOIN MONOHYD MACRO 100 MG PO CAPS: 100.0000 mg | ORAL_CAPSULE | Freq: Two times a day (BID) | ORAL | 0 refills | Status: AC

## 2017-10-15 NOTE — Progress Notes (Signed)
Date:  10/15/2017   Name:  Alexandra Watson   DOB:  [birth year]   MRN:  [id number]   Chief Complaint: Urinary Tract Infection (Started sunday. Burning and sore stomach. Frequent urination)  Urinary Tract Infection   This is a new problem. The current episode started in the past 7 days. The problem occurs every urination. The problem has been unchanged. The quality of the pain is described as burning. The pain is mild. There has been no fever. Associated symptoms include chills, frequency and urgency. Pertinent negatives include no hematuria. She has tried nothing for the symptoms.      Review of Systems  Constitutional: Positive for chills. Negative for diaphoresis, fatigue and fever.  Respiratory: Negative for chest tightness and shortness of breath.   Cardiovascular: Negative for chest pain.  Genitourinary: Positive for dysuria, frequency and urgency. Negative for hematuria.    Patient Active Problem List   Diagnosis Date Noted  . Weight loss, abnormal 10/11/2017  . Neuropathy 07/06/2016  . SUI (stress urinary incontinence, female) 11/11/2015  . Incomplete emptying of bladder 11/11/2015  . Recurrent UTI 11/10/2015  . Fracture of ankle, trimalleolar, left, closed 08/08/2015  . Arthritis of knee, left 07/01/2015  . Insomnia 06/30/2015  . Atrophic vaginitis 06/30/2015  . Gastroesophageal reflux disease 06/30/2015  . Osteopenia 05/20/2015  . Anemia 09/14/2012  . Constipation 08/27/2012  . Essential hypertension 10/04/2011  . Hyperlipidemia, mixed 10/04/2011  . History of malignant carcinoid tumor 10/04/2011    Allergies  Allergen Reactions  . Iodinated Diagnostic Agents Hives  . Oxycodone Nausea And Vomiting and Nausea Only    Past Surgical History:  Procedure Laterality Date  . ANKLE ARTHROSCOPY WITH OPEN REDUCTION INTERNAL FIXATION (ORIF)  07/2015   tri-malleolar  . BUNIONECTOMY Bilateral 2001, 2005  . CHOLECYSTECTOMY    . COLONOSCOPY  05/2010   normal  .  GANGLION CYST EXCISION Left   . 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 Smoking status: Former Smoker    Packs/day: 0.25    Years: 20.00    Pack years: 5.00    Types: Cigarettes    Last attempt to quit: 1985    Years since quitting: 34.6  . Smokeless tobacco: Never Used  . Tobacco comment: smoking cessation materials not required  Substance Use Topics  . Alcohol use: Yes    Alcohol/week: 1.0 standard drinks    Types: 1 Glasses of wine per week  . Drug use: No     Medication list has been reviewed and updated.  Current Meds  Medication Sig  . Calcium Carbonate-Vit D-Min (CALCIUM 1200 PO) Take 2,400 mg by mouth.  . CHOLECALCIFEROL PO Take 1 tablet by mouth daily.  Marland Kitchen gabapentin (NEURONTIN) 300 MG capsule TAKE 1 CAPSULE DAILY  . hydrochlorothiazide (HYDRODIURIL) 25 MG tablet TAKE 1 TABLET DAILY  . metoprolol tartrate (LOPRESSOR) 50 MG tablet TAKE 1 TABLET DAILY (Patient taking differently: 25 mg. )  . Omega-3 Fatty Acids (FISH OIL) 1000 MG CAPS Take by mouth.  . polyethylene glycol (MIRALAX / GLYCOLAX) packet Take 17 g by mouth daily.  . Probiotic Product (BIOHM PROBIOTIC SUPPLEMENT PO) Take by mouth daily.   . sertraline (ZOLOFT) 25 MG tablet Take 1 tablet (25 mg total) by mouth daily.  . simvastatin (ZOCOR) 20 MG tablet TAKE 1 TABLET DAILY  . traZODone (DESYREL) 50 MG tablet Take 50 mg by mouth at bedtime.  PHQ 2/9 Scores 10/11/2017 07/06/2016 07/01/2015  PHQ - 2 Score 1 0 1  PHQ- 9 Score - - 2    Physical Exam  Constitutional: She appears well-developed and well-nourished.  Cardiovascular: Normal rate, regular rhythm and normal heart sounds.  Pulmonary/Chest: Effort normal and breath sounds normal. No respiratory distress.  Abdominal: Soft. Bowel sounds are normal. There is tenderness in the suprapubic area. There is no rebound, no guarding and no CVA tenderness.  Psychiatric: She has a normal mood and affect.    Nursing note and vitals reviewed.   BP (!) 142/70   Pulse 73   Ht 5\' 4"  (1.626 m)   Wt 139 lb 4.8 oz (63.2 kg)   SpO2 99%   BMI 23.91 kg/m   Assessment and Plan: 1. Acute cystitis without hematuria macrobid x 7 days  Check culture results - POC urinalysis w microscopic (non auto) - Urine Culture   Meds ordered this encounter  Medications  . nitrofurantoin, macrocrystal-monohydrate, (MACROBID) 100 MG capsule    Sig: Take 1 capsule (100 mg total) by mouth 2 (two) times daily for 7 days.    Dispense:  14 capsule    Refill:  0    Partially dictated using Editor, commissioning. Any errors are unintentional.  Halina Maidens, MD [facility]  10/15/2017   There are no diagnoses linked to this encounter.

## 2017-10-21 LAB — URINE CULTURE

## 2017-11-02 ENCOUNTER — Other Ambulatory Visit: Payer: Self-pay | Admitting: Internal Medicine

## 2017-11-02 ENCOUNTER — Encounter: Payer: Self-pay | Admitting: Internal Medicine

## 2017-11-02 DIAGNOSIS — F419 Anxiety disorder, unspecified: Secondary | ICD-10-CM | POA: Insufficient documentation

## 2017-11-02 DIAGNOSIS — F418 Other specified anxiety disorders: Secondary | ICD-10-CM

## 2017-11-02 DIAGNOSIS — F411 Generalized anxiety disorder: Secondary | ICD-10-CM | POA: Insufficient documentation

## 2017-11-07 DIAGNOSIS — R69 Illness, unspecified: Secondary | ICD-10-CM | POA: Diagnosis not present

## 2017-11-11 ENCOUNTER — Ambulatory Visit (INDEPENDENT_AMBULATORY_CARE_PROVIDER_SITE_OTHER): Payer: Medicare HMO | Admitting: Internal Medicine

## 2017-11-11 ENCOUNTER — Encounter: Payer: Self-pay | Admitting: Internal Medicine

## 2017-11-11 ENCOUNTER — Encounter: Payer: Medicare HMO | Admitting: Internal Medicine

## 2017-11-11 VITALS — BP 136/74 | HR 70 | Ht 64.0 in | Wt 142.6 lb

## 2017-11-11 DIAGNOSIS — I10 Essential (primary) hypertension: Secondary | ICD-10-CM | POA: Diagnosis not present

## 2017-11-11 DIAGNOSIS — F411 Generalized anxiety disorder: Secondary | ICD-10-CM

## 2017-11-11 DIAGNOSIS — Z859 Personal history of malignant neoplasm, unspecified: Secondary | ICD-10-CM | POA: Diagnosis not present

## 2017-11-11 DIAGNOSIS — R69 Illness, unspecified: Secondary | ICD-10-CM | POA: Diagnosis not present

## 2017-11-11 DIAGNOSIS — R634 Abnormal weight loss: Secondary | ICD-10-CM | POA: Diagnosis not present

## 2017-11-11 NOTE — Progress Notes (Signed)
Date:  11/11/2017   Name:  Alexandra Watson   DOB:  [birth year]   MRN:  [id number]   Chief Complaint: Depression (Follow up.) and Weight Loss (Follow up.) Depression         This is a recurrent problem.  The problem occurs daily.  The problem has been gradually improving since onset.  Associated symptoms include decreased interest and appetite change.  Associated symptoms include no headaches and no suicidal ideas.     The symptoms are aggravated by social issues.  Past treatments include SSRIs - Selective serotonin reuptake inhibitors.  Compliance with treatment is good. Hypertension  This is a chronic problem. The problem is controlled. Pertinent negatives include no chest pain, headaches or palpitations. Past treatments include beta blockers and diuretics. The current treatment provides significant improvement.  Weight loss - She is feeling better emotionally.  Appetite is improved and she has gained a few pounds.  She is tolerating sertraline well - no nausea, HA or abdominal pain. She has her follow Lung carcinoid follow up at [facility] at the end of the week and is slightly more anxious about that.   Review of Systems  Constitutional: Positive for appetite change. Negative for chills and fever.  Respiratory: Negative for choking, wheezing and stridor.   Cardiovascular: Negative for chest pain and palpitations.  Neurological: Negative for facial asymmetry, light-headedness and headaches.  Psychiatric/Behavioral: Positive for depression and dysphoric mood. Negative for sleep disturbance and suicidal ideas. The patient is not nervous/anxious.     Patient Active Problem List   Diagnosis Date Noted  . Anxiety disorder 11/02/2017  . Weight loss, abnormal 10/11/2017  . Neuropathy 07/06/2016  . SUI (stress urinary incontinence, female) 11/11/2015  . Incomplete emptying of bladder 11/11/2015  . Recurrent UTI 11/10/2015  . Fracture of ankle, trimalleolar, left, closed 08/08/2015  . Arthritis  of knee, left 07/01/2015  . Insomnia 06/30/2015  . Atrophic vaginitis 06/30/2015  . Gastroesophageal reflux disease 06/30/2015  . Osteopenia 05/20/2015  . Anemia 09/14/2012  . Constipation 08/27/2012  . Essential hypertension 10/04/2011  . Hyperlipidemia, mixed 10/04/2011  . History of malignant carcinoid tumor 10/04/2011    Allergies  Allergen Reactions  . Iodinated Diagnostic Agents Hives  . Oxycodone Nausea And Vomiting and Nausea Only    Past Surgical History:  Procedure Laterality Date  . ANKLE ARTHROSCOPY WITH OPEN REDUCTION INTERNAL FIXATION (ORIF)  07/2015   tri-malleolar  . BUNIONECTOMY Bilateral 2001, 2005  . CHOLECYSTECTOMY    . COLONOSCOPY  05/2010   normal  . GANGLION CYST EXCISION Left   . 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 Smoking status: Former Smoker    Packs/day: 0.25    Years: 20.00    Pack years: 5.00    Types: Cigarettes    Last attempt to quit: 1985    Years since quitting: 34.7  . Smokeless tobacco: Never Used  . Tobacco comment: smoking cessation materials not required  Substance Use Topics  . Alcohol use: Yes    Alcohol/week: 1.0 standard drinks    Types: 1 Glasses of wine per week  . Drug use: No     Medication list has been reviewed and updated.  Current Meds  Medication Sig  . Calcium Carbonate-Vit D-Min (CALCIUM 1200 PO) Take 2,400 mg by mouth.  . CHOLECALCIFEROL PO Take 1 tablet by mouth daily.  Marland Kitchen gabapentin (NEURONTIN) 300 MG capsule TAKE 1  CAPSULE DAILY  . hydrochlorothiazide (HYDRODIURIL) 25 MG tablet TAKE 1 TABLET DAILY  . metoprolol tartrate (LOPRESSOR) 50 MG tablet TAKE 1 TABLET DAILY (Patient taking differently: 25 mg. )  . Omega-3 Fatty Acids (FISH OIL) 1000 MG CAPS Take by mouth.  . polyethylene glycol (MIRALAX / GLYCOLAX) packet Take 17 g by mouth daily.  . Probiotic Product (BIOHM PROBIOTIC SUPPLEMENT PO) Take by mouth daily.   . sertraline (ZOLOFT) 25  MG tablet TAKE 1 TABLET BY MOUTH EVERY DAY  . simvastatin (ZOCOR) 20 MG tablet TAKE 1 TABLET DAILY  . traZODone (DESYREL) 50 MG tablet Take 50 mg by mouth at bedtime.    PHQ 2/9 Scores 11/11/2017 10/11/2017 07/06/2016 07/01/2015  PHQ - 2 Score 1 1 0 1  PHQ- 9 Score 3 - - 2   Wt Readings from Last 3 Encounters:  11/11/17 142 lb 9.6 oz (64.7 kg)  10/15/17 139 lb 4.8 oz (63.2 kg)  10/11/17 138 lb 3.2 oz (62.7 kg)    Physical Exam  Constitutional: She is oriented to person, place, and time. She appears well-developed. No distress.  HENT:  Head: Normocephalic and atraumatic.  Pulmonary/Chest: Effort normal. No respiratory distress.  Musculoskeletal: Normal range of motion.  Neurological: She is alert and oriented to person, place, and time.  Skin: Skin is warm and dry. No rash noted.  Psychiatric: She has a normal mood and affect. Her speech is normal and behavior is normal. Thought content normal.  Nursing note and vitals reviewed.   BP 136/74 (BP Location: Right Arm, Patient Position: Sitting, Cuff Size: Normal)   Pulse 70   Ht 5\' 4"  (1.626 m)   Wt 142 lb 9.6 oz (64.7 kg)   SpO2 98%   BMI 24.48 kg/m   Assessment and Plan: 1. Essential hypertension cotnrolled  2. Generalized anxiety disorder Improved on low dose sertraline  3. Weight loss, abnormal Improving with mild weight gain Continue supplements and healthy diet  4. History of malignant carcinoid tumor Follow up at the end of the week with [facility]   No orders of the defined types were placed in this encounter.   Partially dictated using Editor, commissioning. Any errors are unintentional.  Halina Maidens, MD [facility]  11/11/2017

## 2017-11-15 DIAGNOSIS — D3A8 Other benign neuroendocrine tumors: Secondary | ICD-10-CM | POA: Diagnosis not present

## 2017-11-15 DIAGNOSIS — C7A090 Malignant carcinoid tumor of the bronchus and lung: Secondary | ICD-10-CM | POA: Diagnosis not present

## 2017-11-15 DIAGNOSIS — Z79899 Other long term (current) drug therapy: Secondary | ICD-10-CM | POA: Diagnosis not present

## 2017-11-15 DIAGNOSIS — R918 Other nonspecific abnormal finding of lung field: Secondary | ICD-10-CM | POA: Diagnosis not present

## 2017-11-15 DIAGNOSIS — Z87891 Personal history of nicotine dependence: Secondary | ICD-10-CM | POA: Diagnosis not present

## 2017-11-15 DIAGNOSIS — E340 Carcinoid syndrome: Secondary | ICD-10-CM | POA: Diagnosis not present

## 2017-11-15 DIAGNOSIS — Z902 Acquired absence of lung [part of]: Secondary | ICD-10-CM | POA: Diagnosis not present

## 2017-12-17 DIAGNOSIS — R69 Illness, unspecified: Secondary | ICD-10-CM | POA: Diagnosis not present

## 2017-12-25 ENCOUNTER — Telehealth: Payer: Self-pay

## 2017-12-25 NOTE — Telephone Encounter (Signed)
Faxed to [facility]!

## 2017-12-26 ENCOUNTER — Other Ambulatory Visit: Payer: Self-pay

## 2017-12-26 ENCOUNTER — Telehealth: Payer: Self-pay | Admitting: Internal Medicine

## 2017-12-26 NOTE — Telephone Encounter (Signed)
[facility]eds [facility] [place] since it is less expensive than through mail order.   gabapentin (NEURONTIN) 300 MG capsule [[id number]]

## 2017-12-26 NOTE — Telephone Encounter (Signed)
Patient has refill for 90 days still with [facility]. She needs to call and have this sent to her home. Thank you.

## 2018-01-01 ENCOUNTER — Other Ambulatory Visit: Payer: Self-pay

## 2018-01-01 MED ORDER — GABAPENTIN 300 MG PO CAPS: 300.0000 mg | ORAL_CAPSULE | Freq: Every day | ORAL | 1 refills | Status: DC

## 2018-01-14 ENCOUNTER — Encounter: Payer: Self-pay | Admitting: Internal Medicine

## 2018-01-14 ENCOUNTER — Ambulatory Visit (INDEPENDENT_AMBULATORY_CARE_PROVIDER_SITE_OTHER): Payer: Medicare HMO | Admitting: Internal Medicine

## 2018-01-14 VITALS — BP 128/64 | HR 78 | Ht 64.0 in | Wt 141.0 lb

## 2018-01-14 DIAGNOSIS — I10 Essential (primary) hypertension: Secondary | ICD-10-CM

## 2018-01-14 DIAGNOSIS — C7A090 Malignant carcinoid tumor of the bronchus and lung: Secondary | ICD-10-CM | POA: Diagnosis not present

## 2018-01-14 DIAGNOSIS — Z23 Encounter for immunization: Secondary | ICD-10-CM

## 2018-01-14 DIAGNOSIS — F411 Generalized anxiety disorder: Secondary | ICD-10-CM

## 2018-01-14 DIAGNOSIS — R69 Illness, unspecified: Secondary | ICD-10-CM | POA: Diagnosis not present

## 2018-01-14 MED ORDER — ZOSTER VAC RECOMB ADJUVANTED 50 MCG/0.5ML IM SUSR: 0.5000 mL | Freq: Once | INTRAMUSCULAR | 1 refills | Status: AC

## 2018-01-14 NOTE — Progress Notes (Signed)
Date:  01/14/2018   Name:  Alexandra Watson   DOB:  December 31, 1945   MRN:  [id number]   Chief Complaint: Depression (Follow up.) and Hypertension  Hypertension  This is a chronic problem. The problem is controlled. Pertinent negatives include no chest pain, headaches, palpitations or shortness of breath. There are no compliance problems.   Depression         This is a new problem.  The current episode started more than 1 month ago.   The problem has been gradually improving since onset.  Associated symptoms include no fatigue, no appetite change and no headaches.     The symptoms are aggravated by social issues.  Past treatments include SSRIs - Selective serotonin reuptake inhibitors.  Compliance with treatment is good.  Previous treatment provided significant relief. 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  She feels well, no SOB, fever or chills. She is worrying less and plans to put off concern until her next scan in January.  Review of Systems  Constitutional: Negative for appetite change, fatigue, fever and unexpected weight change.  HENT: Negative for tinnitus and trouble swallowing.   Eyes: Negative for visual disturbance.  Respiratory: Positive for cough. Negative for chest tightness, shortness of breath and wheezing.   Cardiovascular: Negative for chest pain, palpitations and leg swelling.  Gastrointestinal: Negative for abdominal pain.  Endocrine: Negative for polydipsia and polyuria.  Genitourinary: Negative for dysuria and hematuria.  Musculoskeletal: Negative for arthralgias.  Allergic/Immunologic: Negative for environmental allergies.  Neurological: Negative for dizziness, tremors, numbness and headaches.  Psychiatric/Behavioral: Positive for depression. Negative for dysphoric mood.    Patient Active Problem List   Diagnosis Date Noted  . Anxiety disorder 11/02/2017  . Weight loss, abnormal 10/11/2017  . Neuropathy 07/06/2016  . SUI  (stress urinary incontinence, female) 11/11/2015  . Incomplete emptying of bladder 11/11/2015  . Recurrent UTI 11/10/2015  . Fracture of ankle, trimalleolar, left, closed 08/08/2015  . Arthritis of knee, left 07/01/2015  . Insomnia 06/30/2015  . Atrophic vaginitis 06/30/2015  . Gastroesophageal reflux disease 06/30/2015  . Osteopenia 05/20/2015  . Anemia 09/14/2012  . Constipation 08/27/2012  . Essential hypertension 10/04/2011  . Hyperlipidemia, mixed 10/04/2011  . 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 Iodinated Diagnostic Agents Hives  . Oxycodone Nausea And Vomiting and Nausea Only    Past Surgical History:  Procedure Laterality Date  . ANKLE ARTHROSCOPY WITH OPEN REDUCTION INTERNAL FIXATION (ORIF)  07/2015   tri-malleolar  . BUNIONECTOMY Bilateral 2001, 2005  . CHOLECYSTECTOMY    . COLONOSCOPY  05/2010   normal  . GANGLION CYST EXCISION Left   . THORACOTOMY / DECORTICATION PARIETAL PLEURA Left 06/2017   [facility]  . 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 Smoking status: Former Smoker    Packs/day: 0.25    Years: 20.00    Pack years: 5.00    Types: Cigarettes    Last attempt to quit: 1985    Years since quitting: 34.9  . Smokeless tobacco: Never Used  . Tobacco comment: smoking cessation materials not required  Substance Use Topics  . Alcohol use: Yes    Alcohol/week: 1.0 standard drinks    Types: 1 Glasses of wine per week  . Drug use: No     Medication list has been reviewed and updated.  Current Meds  Medication Sig  . Calcium Carbonate-Vit D-Min (CALCIUM 1200 PO) Take 2,400 mg by mouth.  . CHOLECALCIFEROL PO Take 1 tablet by mouth daily.  Marland Kitchen gabapentin (NEURONTIN) 300 MG capsule Take 1 capsule (300 mg total) by mouth daily.  . hydrochlorothiazide (HYDRODIURIL) 25 MG tablet TAKE 1 TABLET DAILY  . Melatonin 10 MG TABS Take by mouth at bedtime.  .  metoprolol tartrate (LOPRESSOR) 50 MG tablet TAKE 1 TABLET DAILY (Patient taking differently: 25 mg. )  . Omega-3 Fatty Acids (FISH OIL) 1000 MG CAPS Take by mouth.  . polyethylene glycol (MIRALAX / GLYCOLAX) packet Take 17 g by mouth daily.  . Probiotic Product (BIOHM PROBIOTIC SUPPLEMENT PO) Take by mouth daily.   . sertraline (ZOLOFT) 25 MG tablet TAKE 1 TABLET BY MOUTH EVERY DAY  . simvastatin (ZOCOR) 20 MG tablet TAKE 1 TABLET DAILY  . traZODone (DESYREL) 50 MG tablet Take 50 mg by mouth at bedtime.    PHQ 2/9 Scores 01/14/2018 11/11/2017 10/11/2017 07/06/2016  PHQ - 2 Score 0 1 1 0  PHQ- 9 Score - 3 - -    Physical Exam  Constitutional: She is oriented to person, place, and time. She appears well-developed. No distress.  HENT:  Head: Normocephalic and atraumatic.  Neck: Normal range of motion. Neck supple.  Cardiovascular: Normal rate, regular rhythm and normal heart sounds.  Pulmonary/Chest: Effort normal and breath sounds normal. No respiratory distress.  Musculoskeletal: Normal range of motion.  Lymphadenopathy:    She has no cervical adenopathy.  Neurological: She is alert and oriented to person, place, and time.  Skin: Skin is warm and dry. No rash noted.  Psychiatric: She has a normal mood and affect. Her speech is normal and behavior is normal. Thought content normal.  Nursing note and vitals reviewed.  Wt Readings from Last 3 Encounters:  01/14/18 141 lb (64 kg)  11/11/17 142 lb 9.6 oz (64.7 kg)  10/15/17 139 lb 4.8 oz (63.2 kg)    BP 128/64 (BP Location: Right Arm, Patient Position: Sitting, Cuff Size: Normal)   Pulse 78   Ht 5\' 4"  (1.626 m)   Wt 141 lb (64 kg)   SpO2 99%   BMI 24.20 kg/m   Assessment and Plan: 1. Essential hypertension controlled - CBC with Differential/Platelet - Comprehensive metabolic panel - TSH  2. 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 Generalized  anxiety disorder Much improved on zoloft - continue current dose  4. Need for shingles vaccine - Zoster Vaccine Adjuvanted [facility]) injection; Inject 0.5 mLs into the muscle once for 1 dose.  Dispense: 0.5 mL; Refill: 1   Partially dictated using Editor, commissioning. Any errors are unintentional.  Halina Maidens, MD [facility]  01/14/2018

## 2018-01-15 LAB — COMPREHENSIVE METABOLIC PANEL
A/G RATIO: 2.1 (ref 1.2–2.2)
ALBUMIN: 4.7 g/dL (ref 3.5–4.8)
ALT: 21 IU/L (ref 0–32)
AST: 21 IU/L (ref 0–40)
Alkaline Phosphatase: 64 IU/L (ref 39–117)
BUN / CREAT RATIO: 21 (ref 12–28)
BUN: 18 mg/dL (ref 8–27)
Bilirubin Total: 0.3 mg/dL (ref 0.0–1.2)
CALCIUM: 9.8 mg/dL (ref 8.7–10.3)
CHLORIDE: 96 mmol/L (ref 96–106)
CO2: 27 mmol/L (ref 20–29)
Creatinine, Ser: 0.87 mg/dL (ref 0.57–1.00)
GFR calc Af Amer: 76 mL/min/{1.73_m2} (ref >59)
GFR calc non Af Amer: 66 mL/min/{1.73_m2} (ref >59)
GLOBULIN, TOTAL: 2.2 g/dL (ref 1.5–4.5)
Glucose: 85 mg/dL (ref 65–99)
POTASSIUM: 4 mmol/L (ref 3.5–5.2)
SODIUM: 137 mmol/L (ref 134–144)
TOTAL PROTEIN: 6.9 g/dL (ref 6.0–8.5)

## 2018-01-15 LAB — CBC WITH DIFFERENTIAL/PLATELET
BASOS: 1 %
Basophils Absolute: 0 10*3/uL (ref 0.0–0.2)
EOS (ABSOLUTE): 0.1 10*3/uL (ref 0.0–0.4)
EOS: 1 %
HEMATOCRIT: 35.5 % (ref 34.0–46.6)
Hemoglobin: 11.8 g/dL (ref 11.1–15.9)
IMMATURE GRANULOCYTES: 0 %
Immature Grans (Abs): 0 10*3/uL (ref 0.0–0.1)
Lymphocytes Absolute: 1.5 10*3/uL (ref 0.7–3.1)
Lymphs: 23 %
MCH: 29.4 pg (ref 26.6–33.0)
MCHC: 33.2 g/dL (ref 31.5–35.7)
MCV: 88 fL (ref 79–97)
MONOCYTES: 11 %
Monocytes Absolute: 0.7 10*3/uL (ref 0.1–0.9)
NEUTROS PCT: 64 %
Neutrophils Absolute: 4.1 10*3/uL (ref 1.4–7.0)
Platelets: 288 10*3/uL (ref 150–450)
RBC: 4.02 x10E6/uL (ref 3.77–5.28)
RDW: 12.5 % (ref 12.3–15.4)
WBC: 6.5 10*3/uL (ref 3.4–10.8)

## 2018-01-15 LAB — TSH: TSH: 1.15 u[IU]/mL (ref 0.450–4.500)

## 2018-01-29 DIAGNOSIS — H524 Presbyopia: Secondary | ICD-10-CM | POA: Diagnosis not present

## 2018-03-03 DIAGNOSIS — D225 Melanocytic nevi of trunk: Secondary | ICD-10-CM | POA: Diagnosis not present

## 2018-03-03 DIAGNOSIS — D2261 Melanocytic nevi of right upper limb, including shoulder: Secondary | ICD-10-CM | POA: Diagnosis not present

## 2018-03-03 DIAGNOSIS — Z0100 Encounter for examination of eyes and vision without abnormal findings: Secondary | ICD-10-CM | POA: Diagnosis not present

## 2018-03-03 DIAGNOSIS — D2262 Melanocytic nevi of left upper limb, including shoulder: Secondary | ICD-10-CM | POA: Diagnosis not present

## 2018-03-03 DIAGNOSIS — D2272 Melanocytic nevi of left lower limb, including hip: Secondary | ICD-10-CM | POA: Diagnosis not present

## 2018-03-03 DIAGNOSIS — D2271 Melanocytic nevi of right lower limb, including hip: Secondary | ICD-10-CM | POA: Diagnosis not present

## 2018-03-03 DIAGNOSIS — L821 Other seborrheic keratosis: Secondary | ICD-10-CM | POA: Diagnosis not present

## 2018-03-13 DIAGNOSIS — K769 Liver disease, unspecified: Secondary | ICD-10-CM | POA: Diagnosis not present

## 2018-03-13 DIAGNOSIS — R918 Other nonspecific abnormal finding of lung field: Secondary | ICD-10-CM | POA: Diagnosis not present

## 2018-03-13 DIAGNOSIS — Z87891 Personal history of nicotine dependence: Secondary | ICD-10-CM | POA: Diagnosis not present

## 2018-03-13 DIAGNOSIS — C7A090 Malignant carcinoid tumor of the bronchus and lung: Secondary | ICD-10-CM | POA: Diagnosis not present

## 2018-04-03 DIAGNOSIS — R911 Solitary pulmonary nodule: Secondary | ICD-10-CM | POA: Diagnosis not present

## 2018-04-03 DIAGNOSIS — D3A00 Benign carcinoid tumor of unspecified site: Secondary | ICD-10-CM | POA: Diagnosis not present

## 2018-04-03 DIAGNOSIS — K769 Liver disease, unspecified: Secondary | ICD-10-CM | POA: Diagnosis not present

## 2018-04-03 DIAGNOSIS — C7A090 Malignant carcinoid tumor of the bronchus and lung: Secondary | ICD-10-CM | POA: Diagnosis not present

## 2018-04-21 ENCOUNTER — Other Ambulatory Visit: Payer: Self-pay | Admitting: Internal Medicine

## 2018-04-21 DIAGNOSIS — F418 Other specified anxiety disorders: Secondary | ICD-10-CM

## 2018-05-15 ENCOUNTER — Ambulatory Visit: Payer: Medicare HMO | Admitting: Internal Medicine

## 2018-05-16 ENCOUNTER — Other Ambulatory Visit: Payer: Self-pay

## 2018-05-19 ENCOUNTER — Ambulatory Visit: Payer: Medicare HMO | Admitting: Internal Medicine

## 2018-05-27 DIAGNOSIS — K5900 Constipation, unspecified: Secondary | ICD-10-CM | POA: Diagnosis not present

## 2018-05-27 DIAGNOSIS — K219 Gastro-esophageal reflux disease without esophagitis: Secondary | ICD-10-CM | POA: Diagnosis not present

## 2018-05-27 DIAGNOSIS — R69 Illness, unspecified: Secondary | ICD-10-CM | POA: Diagnosis not present

## 2018-05-27 DIAGNOSIS — Z823 Family history of stroke: Secondary | ICD-10-CM | POA: Diagnosis not present

## 2018-05-27 DIAGNOSIS — Z809 Family history of malignant neoplasm, unspecified: Secondary | ICD-10-CM | POA: Diagnosis not present

## 2018-05-27 DIAGNOSIS — Z7982 Long term (current) use of aspirin: Secondary | ICD-10-CM | POA: Diagnosis not present

## 2018-05-27 DIAGNOSIS — E785 Hyperlipidemia, unspecified: Secondary | ICD-10-CM | POA: Diagnosis not present

## 2018-05-27 DIAGNOSIS — G629 Polyneuropathy, unspecified: Secondary | ICD-10-CM | POA: Diagnosis not present

## 2018-05-27 DIAGNOSIS — I10 Essential (primary) hypertension: Secondary | ICD-10-CM | POA: Diagnosis not present

## 2018-05-27 DIAGNOSIS — G4700 Insomnia, unspecified: Secondary | ICD-10-CM | POA: Diagnosis not present

## 2018-06-11 ENCOUNTER — Other Ambulatory Visit: Payer: Self-pay | Admitting: Internal Medicine

## 2018-06-23 ENCOUNTER — Other Ambulatory Visit: Payer: Self-pay | Admitting: Internal Medicine

## 2018-06-23 DIAGNOSIS — Z1231 Encounter for screening mammogram for malignant neoplasm of breast: Secondary | ICD-10-CM

## 2018-07-03 DIAGNOSIS — R918 Other nonspecific abnormal finding of lung field: Secondary | ICD-10-CM | POA: Diagnosis not present

## 2018-07-03 DIAGNOSIS — C7B09 Secondary carcinoid tumors of other sites: Secondary | ICD-10-CM | POA: Diagnosis not present

## 2018-07-03 DIAGNOSIS — C7A00 Malignant carcinoid tumor of unspecified site: Secondary | ICD-10-CM | POA: Diagnosis not present

## 2018-07-03 DIAGNOSIS — C7A090 Malignant carcinoid tumor of the bronchus and lung: Secondary | ICD-10-CM | POA: Diagnosis not present

## 2018-07-03 DIAGNOSIS — Z87891 Personal history of nicotine dependence: Secondary | ICD-10-CM | POA: Diagnosis not present

## 2018-07-03 DIAGNOSIS — K769 Liver disease, unspecified: Secondary | ICD-10-CM | POA: Diagnosis not present

## 2018-07-08 ENCOUNTER — Encounter: Payer: Self-pay | Admitting: Internal Medicine

## 2018-07-08 ENCOUNTER — Other Ambulatory Visit: Payer: Self-pay

## 2018-07-08 ENCOUNTER — Ambulatory Visit (INDEPENDENT_AMBULATORY_CARE_PROVIDER_SITE_OTHER): Payer: Medicare HMO | Admitting: Internal Medicine

## 2018-07-08 VITALS — BP 132/64 | HR 61 | Ht 64.0 in | Wt 151.4 lb

## 2018-07-08 DIAGNOSIS — I10 Essential (primary) hypertension: Secondary | ICD-10-CM

## 2018-07-08 DIAGNOSIS — F411 Generalized anxiety disorder: Secondary | ICD-10-CM | POA: Diagnosis not present

## 2018-07-08 DIAGNOSIS — E782 Mixed hyperlipidemia: Secondary | ICD-10-CM

## 2018-07-08 DIAGNOSIS — R69 Illness, unspecified: Secondary | ICD-10-CM | POA: Diagnosis not present

## 2018-07-08 DIAGNOSIS — C7A090 Malignant carcinoid tumor of the bronchus and lung: Secondary | ICD-10-CM

## 2018-07-08 DIAGNOSIS — K5900 Constipation, unspecified: Secondary | ICD-10-CM

## 2018-07-08 NOTE — Progress Notes (Signed)
Date:  07/08/2018   Name:  Alexandra Watson   DOB:  November 22, 1945   MRN:  [id number]   Chief Complaint: Hypertension (Follow up)  Hypertension  This is a chronic problem. The problem is unchanged. The problem is controlled. Associated symptoms include anxiety. Pertinent negatives include no chest pain, headaches, palpitations or shortness of breath. Past treatments include beta blockers and diuretics. The current treatment provides moderate improvement. There are no compliance problems.   Anxiety  Presents for follow-up visit. Patient reports no chest pain, depressed mood, dizziness, excessive worry, insomnia, irritability, nervous/anxious behavior, palpitations or shortness of breath. Symptoms occur rarely. The quality of sleep is good.   Compliance with medications is 76-100%.  Constipation  This is a recurrent problem. The problem has been waxing and waning since onset. Her stool frequency is 4 to 5 times per week. The stool is described as formed. The patient is on a high fiber diet. She exercises regularly. There has been adequate water intake. Pertinent negatives include no abdominal pain or fever. She has tried diet changes and fiber for the symptoms. The treatment provided mild (she wants to try probiotics) relief.  Hyperlipidemia  The problem is controlled. Pertinent negatives include no chest pain or shortness of breath. Current antihyperlipidemic treatment includes statins. The current treatment provides significant improvement of lipids. There are no compliance problems (pt asks about taking CoQ-10).   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  Recent scan showed activity in left lung.  She denies cough, shortness of breath, chest pain.  She is trying to improve her diet with smoothies and protein.  She asks about fish oil, tumeric, CoQ-10.  PET scan 03/2018: IMPRESSION:  1. Mild avidity of left lower lobe pulmonary nodule, consistent with known  carcinoid. No evidence of  dedifferentiation.   2. Previously noted hypoattenuating liver lesion without increased  metabolic activity.  3. No hypermetabolic disease identified. Review of Systems  Constitutional: Negative for appetite change, fatigue, fever, irritability and unexpected weight change.  HENT: Negative for tinnitus and trouble swallowing.   Eyes: Negative for visual disturbance.  Respiratory: Negative for cough, chest tightness and shortness of breath.   Cardiovascular: Negative for chest pain, palpitations and leg swelling.  Gastrointestinal: Positive for constipation. Negative for abdominal pain.  Endocrine: Negative for polydipsia and polyuria.  Genitourinary: Negative for dysuria and hematuria.  Musculoskeletal: Positive for arthralgias (knee and ankle on left).  Skin: Negative for color change and rash.  Neurological: Negative for dizziness, tremors, numbness and headaches.  Psychiatric/Behavioral: Negative for dysphoric mood. The patient is not nervous/anxious and does not have insomnia.     Patient Active Problem List   Diagnosis Date Noted   Anxiety disorder 11/02/2017   Weight loss, abnormal 10/11/2017   Neuropathy 07/06/2016   SUI (stress urinary incontinence, female) 11/11/2015   Incomplete emptying of bladder 11/11/2015   Recurrent UTI 11/10/2015   Fracture of ankle, trimalleolar, left, closed 08/08/2015   Arthritis of knee, left 07/01/2015   Insomnia 06/30/2015   Atrophic vaginitis 06/30/2015   Gastroesophageal reflux disease 06/30/2015   Osteopenia 05/20/2015   Anemia 09/14/2012   Constipation 08/27/2012   Essential hypertension 10/04/2011   Hyperlipidemia, mixed 10/04/2011   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    Allergies  Allergen Reactions   Iodinated Diagnostic Agents Hives   Oxycodone Nausea And Vomiting and Nausea Only    Past Surgical History:  Procedure Laterality Date   ANKLE ARTHROSCOPY WITH OPEN REDUCTION INTERNAL FIXATION  (  ORIF)  07/2015   tri-malleolar   BUNIONECTOMY Bilateral 2001, 2005   CHOLECYSTECTOMY     COLONOSCOPY  05/2010   normal   GANGLION CYST EXCISION Left    THORACOTOMY / DECORTICATION PARIETAL PLEURA Left 06/2017   [facility]   VATS Sleeve lobectomy Left 09/2011   typical variant carcinoid    Social History   Tobacco Use   Smoking status: Former Smoker    Packs/day: 0.25    Years: 20.00    Pack years: 5.00    Types: Cigarettes    Last attempt to quit: 1985    Years since quitting: 35.3   Smokeless tobacco: Never Used   Tobacco comment: smoking cessation materials not required  Substance Use Topics   Alcohol use: Yes    Alcohol/week: 1.0 standard drinks    Types: 1 Glasses of wine per week   Drug use: No     Medication list has been reviewed and updated.  Current Meds  Medication Sig   Calcium Carbonate-Vit D-Min (CALCIUM 1200 PO) Take 2,400 mg by mouth.   CHOLECALCIFEROL PO Take 1 tablet by mouth daily.   gabapentin (NEURONTIN) 300 MG capsule Take 1 capsule (300 mg total) by mouth daily.   hydrochlorothiazide (HYDRODIURIL) 25 MG tablet TAKE 1 TABLET DAILY   Melatonin 10 MG TABS Take by mouth at bedtime.   metoprolol tartrate (LOPRESSOR) 50 MG tablet TAKE 1 TABLET DAILY (Patient taking differently: 25 mg. )   Omega-3 Fatty Acids (FISH OIL) 1000 MG CAPS Take by mouth.   polyethylene glycol (MIRALAX / GLYCOLAX) packet Take 17 g by mouth daily.   sertraline (ZOLOFT) 25 MG tablet TAKE 1 TABLET BY MOUTH EVERY DAY   simvastatin (ZOCOR) 20 MG tablet TAKE 1 TABLET DAILY   traZODone (DESYREL) 50 MG tablet Take 50 mg by mouth at bedtime.    PHQ 2/9 Scores 07/08/2018 01/14/2018 11/11/2017 10/11/2017  PHQ - 2 Score 0 0 1 1  PHQ- 9 Score - - 3 -    BP Readings from Last 3 Encounters:  07/08/18 132/64  01/14/18 128/64  11/11/17 136/74    Physical Exam Vitals signs and nursing note reviewed.  Constitutional:      General: She is not in acute distress.     Appearance: She is well-developed.  HENT:     Head: Normocephalic and atraumatic.  Neck:     Musculoskeletal: Normal range of motion and neck supple.     Vascular: No carotid bruit.  Cardiovascular:     Rate and Rhythm: Normal rate and regular rhythm.     Pulses: Normal pulses.          Radial pulses are 2+ on the right side and 2+ on the left side.       Dorsalis pedis pulses are 2+ on the right side and 2+ on the left side.       Posterior tibial pulses are 2+ on the right side and 2+ on the left side.     Heart sounds: Normal heart sounds. No murmur.  Pulmonary:     Effort: Pulmonary effort is normal. No respiratory distress.     Breath sounds: Normal breath sounds. No wheezing or rhonchi.  Musculoskeletal: Normal range of motion.  Lymphadenopathy:     Cervical: No cervical adenopathy.  Skin:    General: Skin is warm and dry.     Findings: No rash.  Neurological:     Mental Status: She is alert and oriented to person, place, and  time.  Psychiatric:        Behavior: Behavior normal.        Thought Content: Thought content normal.     Wt Readings from Last 3 Encounters:  07/08/18 151 lb 6.4 oz (68.7 kg)  01/14/18 141 lb (64 kg)  11/11/17 142 lb 9.6 oz (64.7 kg)    BP 132/64    Pulse 61    Ht 5\' 4"  (1.626 m)    Wt 151 lb 6.4 oz (68.7 kg)    SpO2 98%    BMI 25.99 kg/m   Assessment and Plan: 1. Essential hypertension Controlled, continue current therapy - Comprehensive metabolic panel  2. Hyperlipidemia, mixed On statin therapy Can take CoQ-10 - Lipid panel  3. Constipation, unspecified constipation type Add probiotics  4. Generalized anxiety disorder Doing well on Zoloft - will continue  5. 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 Any errors are unintentional.  Halina Maidens, MD [facility]  07/08/2018

## 2018-07-08 NOTE — Patient Instructions (Signed)
Co-Q 10 is fine  Probiotics are helpful  Tumeric can help joint pain and maybe memory  Fish oil/flax seed is okay

## 2018-07-09 LAB — LIPID PANEL
Chol/HDL Ratio: 3.1 ratio (ref 0.0–4.4)
Cholesterol, Total: 132 mg/dL (ref 100–199)
HDL: 43 mg/dL (ref >39)
LDL Calculated: 61 mg/dL (ref 0–99)
Triglycerides: 139 mg/dL (ref 0–149)
VLDL Cholesterol Cal: 28 mg/dL (ref 5–40)

## 2018-07-09 LAB — COMPREHENSIVE METABOLIC PANEL
ALT: 19 IU/L (ref 0–32)
AST: 20 IU/L (ref 0–40)
Albumin/Globulin Ratio: 2.6 — ABNORMAL HIGH (ref 1.2–2.2)
Albumin: 4.7 g/dL (ref 3.7–4.7)
Alkaline Phosphatase: 71 IU/L (ref 39–117)
BUN/Creatinine Ratio: 19 (ref 12–28)
BUN: 14 mg/dL (ref 8–27)
Bilirubin Total: 0.2 mg/dL (ref 0.0–1.2)
CO2: 27 mmol/L (ref 20–29)
Calcium: 9.6 mg/dL (ref 8.7–10.3)
Chloride: 93 mmol/L — ABNORMAL LOW (ref 96–106)
Creatinine, Ser: 0.75 mg/dL (ref 0.57–1.00)
GFR calc Af Amer: 91 mL/min/{1.73_m2} (ref >59)
GFR calc non Af Amer: 79 mL/min/{1.73_m2} (ref >59)
Globulin, Total: 1.8 g/dL (ref 1.5–4.5)
Glucose: 86 mg/dL (ref 65–99)
Potassium: 3.9 mmol/L (ref 3.5–5.2)
Sodium: 137 mmol/L (ref 134–144)
Total Protein: 6.5 g/dL (ref 6.0–8.5)

## 2018-07-10 ENCOUNTER — Encounter: Payer: Self-pay | Admitting: Internal Medicine

## 2018-07-14 ENCOUNTER — Ambulatory Visit (INDEPENDENT_AMBULATORY_CARE_PROVIDER_SITE_OTHER): Payer: Medicare HMO

## 2018-07-14 DIAGNOSIS — Z0000 Encounter for general adult medical examination without abnormal findings: Secondary | ICD-10-CM

## 2018-07-14 NOTE — Progress Notes (Signed)
Subjective:   Alexandra Watson is a 73 y.o. female who presents for Medicare Annual (Subsequent) preventive examination.  Virtual Visit via Telephone Note  I connected with Alexandra Watson on 07/14/18 at  1:20 PM EDT by telephone and verified that I am speaking with the correct person using two identifiers.  Medicare Annual Wellness visit conducted telephonically due to Covid-19 pandemic.   Location: Patient: home Provider: office   I discussed the limitations, risks, security and privacy concerns of performing an evaluation and management service by telephone and the availability of in person appointments. The patient expressed understanding and agreed to proceed.  Some vital signs may be absent or patient reported. Pt did not have a blood pressure monitor to be able to check her blood pressure.    Alexandra Marker, LPN  Review of Systems:   Cardiac Risk Factors include: advanced age (>61men, >20 women);dyslipidemia;hypertension     Objective:     Vitals: There were no vitals taken for this visit.  There is no height or weight on file to calculate BMI.  Advanced Directives 07/14/2018 07/10/2017 07/06/2016 08/07/2015  Does Patient Have a Medical Advance Directive? Yes Yes Yes Yes  Type of Advance Directive Living will;Healthcare Power of Alexandra Watson;Living will Living will [place] in Chart? No - copy requested No - copy requested - -    Tobacco Social History   Tobacco Use  Smoking Status Former Smoker  . Packs/day: 0.25  . Years: 20.00  . Pack years: 5.00  . Types: Cigarettes  . Last attempt to quit: 1985  . Years since quitting: 35.4  Smokeless Tobacco Never Used  Tobacco Comment   smoking cessation materials not required     Counseling given: Not Answered Comment: smoking cessation materials not required   Clinical Intake:  Pre-visit preparation completed: Yes  Pain :  No/denies pain     Nutritional Risks: None Diabetes: No  How often do you need to have someone help you when you read instructions, pamphlets, or other written materials from your doctor or pharmacy?: 1 - Never  Interpreter Needed?: No  Information entered by :: Alexandra Marker LPN  Past Medical History:  Diagnosis Date  . Anxiety   . Depression   . GERD (gastroesophageal reflux disease)   . Hyperlipidemia   . Hypertension   . Lung cancer ([place]) 09/2011   left lung broncial ca   Past Surgical History:  Procedure Laterality Date  . ANKLE ARTHROSCOPY WITH OPEN REDUCTION INTERNAL FIXATION (ORIF)  07/2015   tri-malleolar  . BUNIONECTOMY Bilateral 2001, 2005  . CHOLECYSTECTOMY    . COLONOSCOPY  05/2010   normal  . GANGLION CYST EXCISION Left   . THORACOTOMY / DECORTICATION PARIETAL PLEURA Left 06/2017   [facility]  . 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 CAD Brother   . Heart attack Father   . Stroke Mother   . Heart attack Brother   . Breast cancer Neg Hx    Social History   Socioeconomic History  . Marital status: Married    Spouse name: Not on file  . Number of children: 1  . Years of education: some college  . Highest education level: 12th grade  Occupational History  . Occupation: Retired  Scientific laboratory technician  . Financial resource strain: Not hard at all  . Food  insecurity:    Worry: Never true    Inability: Never true  . Transportation needs:    Medical: No    Non-medical: No  Tobacco Use  . Smoking status: Former Smoker    Packs/day: 0.25    Years: 20.00    Pack years: 5.00    Types: Cigarettes    Last attempt to quit: 1985    Years since quitting: 35.4  . Smokeless tobacco: Never Used  . Tobacco comment: smoking cessation materials not required  Substance and Sexual Activity  . Alcohol use: Yes    Alcohol/week: 1.0 standard drinks    Types: 1 Glasses of wine per week  . Drug use: No   . Sexual activity: Not Currently  Lifestyle  . Physical activity:    Days per week: 5 days    Minutes per session: 20 min  . Stress: Only a little  Relationships  . Social connections:    Talks on phone: More than three times a week    Gets together: Three times a week    Attends religious service: More than 4 times per year    Active member of club or organization: No    Attends meetings of clubs or organizations: Never    Relationship status: Married  Other Topics Concern  . Not on file  Social History Narrative  . Not on file    Outpatient Encounter Medications as of 07/14/2018  Medication Sig  . Calcium Carbonate-Vit D-Min (CALCIUM 1200 PO) Take 2,400 mg by mouth.  . CHOLECALCIFEROL PO Take 1 tablet by mouth daily.  Marland Kitchen gabapentin (NEURONTIN) 300 MG capsule Take 1 capsule (300 mg total) by mouth daily.  . hydrochlorothiazide (HYDRODIURIL) 25 MG tablet TAKE 1 TABLET DAILY  . Melatonin 10 MG TABS Take by mouth at bedtime.  . metoprolol tartrate (LOPRESSOR) 50 MG tablet TAKE 1 TABLET DAILY (Patient taking differently: 25 mg. )  . Omega-3 Fatty Acids (FISH OIL) 1000 MG CAPS Take by mouth.  . polyethylene glycol (MIRALAX / GLYCOLAX) packet Take 17 g by mouth daily.  . sertraline (ZOLOFT) 25 MG tablet TAKE 1 TABLET BY MOUTH EVERY DAY  . simvastatin (ZOCOR) 20 MG tablet TAKE 1 TABLET DAILY  . traZODone (DESYREL) 50 MG tablet Take 50 mg by mouth at bedtime.   No facility-administered encounter medications on file as of 07/14/2018.     Activities of Daily Living In your present state of health, do you have any difficulty performing the following activities: 07/14/2018  Hearing? N  Comment declines hearing aids  Vision? N  Comment wears glasses  Difficulty concentrating or making decisions? N  Walking or climbing stairs? N  Dressing or bathing? N  Doing errands, shopping? N  Preparing Food and eating ? N  Using the Toilet? N  In the past six months, have you accidently leaked  urine? N  Do you have problems with loss of bowel control? N  Managing your Medications? N  Managing your Finances? N  Housekeeping or managing your Housekeeping? N  Some recent data might be hidden    Patient Care Team: Glean Hess, MD as PCP - General (Internal Medicine) Lerry Paterson, MD as Referring Physician (Surgical Oncology) Murrell Redden, MD (Urology) Dr. Geryl Councilman (Ophthalmology) Manya Silvas, MD (Gastroenterology)    Assessment:   This is a routine wellness examination for [place].  Exercise Activities and Dietary recommendations Current Exercise Habits: Home exercise routine, Type of exercise: walking, Time (Minutes): 20, Frequency (Times/Week): 5,  Weekly Exercise (Minutes/Week): 100, Intensity: Mild, Exercise limited by: orthopedic condition(s)(ankle pain)  Goals    . DIET - INCREASE WATER INTAKE     Recommend to drink at least 6-8 8oz glasses of water per day.       Fall Risk Fall Risk  07/14/2018 07/08/2018 07/10/2017 07/06/2016 07/01/2015  Falls in the past year? 0 0 Yes Yes No  Comment - - [place] broke ankle August 07 2015 -  Number falls in past yr: 0 0 1 1 -  Injury with Fall? 0 0 Yes Yes -  Comment - - fractured ankle - -  Risk Factor Category  - - - High Fall Risk -  Risk for fall due to : - - Impaired vision - -  Risk for fall due to: Comment - - wears eyeglasses, L cataract - -  Follow up Falls prevention discussed Falls evaluation completed Falls evaluation completed;Education provided;Falls prevention discussed - -   FALL RISK PREVENTION PERTAINING TO THE HOME:  Any stairs in or around the home? Yes  If so, do they handrails? Yes   Home free of loose throw rugs in walkways, pet beds, electrical cords, etc? Yes  Adequate lighting in your home to reduce risk of falls? Yes   ASSISTIVE DEVICES UTILIZED TO PREVENT FALLS:  Life alert? No  Use of a cane, walker or w/c? No  Grab bars in the bathroom? No   Shower chair or bench in shower? No  Elevated toilet seat or a handicapped toilet? Yes   DME ORDERS:  DME order needed?  No   TIMED UP AND GO:  Was the test performed? No . Telephonic visit.    Education: Fall risk prevention has been discussed.  Intervention(s) required? No   Depression Screen PHQ 2/9 Scores 07/14/2018 07/08/2018 01/14/2018 11/11/2017  PHQ - 2 Score 0 0 0 1  PHQ- 9 Score - - - 3     Cognitive Function     6CIT Screen 07/14/2018 07/10/2017 07/06/2016  What Year? 0 points 0 points 0 points  What month? 0 points 0 points 0 points  What time? 0 points 0 points 0 points  Count back from 20 0 points 0 points 0 points  Months in reverse 0 points 0 points 0 points  Repeat phrase 0 points 0 points 0 points  Total Score 0 0 0    Immunization History  Administered Date(s) Administered  . Influenza, High Dose Seasonal PF 11/25/2014, 11/24/2015, 11/22/2016, 12/17/2017  . Pneumococcal Conjugate-13 02/02/2013  . Pneumococcal Polysaccharide-23 11/16/2010, 02/03/2014  . Tdap 06/10/2009  . Zoster 01/25/2018  . Zoster Recombinat (Shingrix) 01/25/2018    Qualifies for Shingles Vaccine? Yes  1st Shingrix dose completed 01/25/18. Due for second dose.   Tdap: Up to date  Flu Vaccine: Up to date  Pneumococcal Vaccine: Up to date   Screening Tests Health Maintenance  Topic Date Due  . MAMMOGRAM  05/07/2018  . INFLUENZA VACCINE  09/27/2018  . TETANUS/TDAP  06/11/2019  . COLONOSCOPY  05/27/2020  . DEXA SCAN  Completed  . PNA vac Low Risk Adult  Completed  . Hepatitis C Screening  Addressed  Cancer Screenings:  Colorectal Screening: Completed 05/28/10. Repeat every 10 years.   Mammogram: Completed 05/06/17. Scheduled for 09/02/18.  Bone Density: Completed 06/24/17. Results reflect  OSTEOPENIA. Repeat every 2 years.   Lung Cancer Screening: (Low Dose CT Chest recommended if Age 70-80 years, 30 pack-year currently smoking OR have  quit w/in 15years.) does not qualify.    Additional Screening:  Hepatitis C Screening: does qualify; Completed 02/03/14  Vision Screening: Recommended annual ophthalmology exams for early detection of glaucoma and other disorders of the eye. Is the patient up to date with their annual eye exam?  Yes  Who is the provider or what is the name of the office in which the pt attends annual eye exams? Dr. Bridgett Larsson  Dental Screening: Recommended annual dental exams for proper oral hygiene  Community Resource Referral:  CRR required this visit?  No       Plan:     I have personally reviewed and addressed the Medicare Annual Wellness questionnaire and have noted the following in the patient's chart:  A. Medical and social history B. Use of alcohol, tobacco or illicit drugs  C. Current medications and supplements D. Functional ability and status E.  Nutritional status F.  Physical activity G. Advance directives H. List of other physicians I.  Hospitalizations, surgeries, and ER visits in previous 12 months J.  [place] such as hearing and vision if needed, cognitive and depression L. Referrals and appointments   In addition, I have reviewed and discussed with patient certain preventive protocols, quality metrics, and best practice recommendations. A written personalized care plan for preventive services as well as general preventive health recommendations were provided to patient.   Signed,  Alexandra Marker, LPN Nurse Health Advisor   Nurse Notes: Pt is doing very well and appreciative of telephonic wellness visit today.

## 2018-07-14 NOTE — Patient Instructions (Signed)
Alexandra Watson , Thank you for taking time to come for your Medicare Wellness Visit. I appreciate your ongoing commitment to your health goals. Please review the following plan we discussed and let me know if I can assist you in the future.   Screening recommendations/referrals: Colonoscopy: done 05/28/2010. Repeat in 2022. Mammogram: done 05/06/17. Scheduled for 09/02/18. Bone Density: done 06/24/17. Repeat in 2021 Recommended yearly ophthalmology/optometry visit for glaucoma screening and checkup Recommended yearly dental visit for hygiene and checkup  Vaccinations: Influenza vaccine: done 12/17/17 Pneumococcal vaccine: done 02/03/14 Tdap vaccine: done 06/10/09 Shingles vaccine: 1st dose 01/25/18 due for 2nd dose    Advanced directives: Please bring a copy of your health care power of attorney and living will to the office at your convenience.  Conditions/risks identified: Keep up the great work!  Next appointment: Please follow up in one year for your Medicare Annual Wellness visit.     Preventive Care 53 Years and Older, Female Preventive care refers to lifestyle choices and visits with your health care provider that can promote health and wellness. What does preventive care include?  A yearly physical exam. This is also called an annual well check.  Dental exams once or twice a year.  Routine eye exams. Ask your health care provider how often you should have your eyes checked.  Personal lifestyle choices, including:  Daily care of your teeth and gums.  Regular physical activity.  Eating a healthy diet.  Avoiding tobacco and drug use.  Limiting alcohol use.  Practicing safe sex.  Taking low-dose aspirin every day.  Taking vitamin and mineral supplements as recommended by your health care provider. What happens during an annual well check? The services and screenings done by your health care provider during your annual well check will depend on your age, overall health,  lifestyle risk factors, and family history of disease. Counseling  Your health care provider may ask you questions about your:  Alcohol use.  Tobacco use.  Drug use.  Emotional well-being.  Home and relationship well-being.  Sexual activity.  Eating habits.  History of falls.  Memory and ability to understand (cognition).  Work and work Statistician.  Reproductive health. Screening  You may have the following tests or measurements:  Height, weight, and BMI.  Blood pressure.  Lipid and cholesterol levels. These may be checked every 5 years, or more frequently if you are over 16 years old.  Skin check.  Lung cancer screening. You may have this screening every year starting at age 28 if you have a 30-pack-year history of smoking and currently smoke or have quit within the past 15 years.  Fecal occult blood test (FOBT) of the stool. You may have this test every year starting at age 38.  Flexible sigmoidoscopy or colonoscopy. You may have a sigmoidoscopy every 5 years or a colonoscopy every 10 years starting at age 45.  Hepatitis C blood test.  Hepatitis B blood test.  Sexually transmitted disease (STD) testing.  Diabetes screening. This is done by checking your blood sugar (glucose) after you have not eaten for a while (fasting). You may have this done every 1-3 years.  Bone density scan. This is done to screen for osteoporosis. You may have this done starting at age 10.  Mammogram. This may be done every 1-2 years. Talk to your health care provider about how often you should have regular mammograms. Talk with your health care provider about your test results, treatment options, and if necessary, the need for  more tests. Vaccines  Your health care provider may recommend certain vaccines, such as:  Influenza vaccine. This is recommended every year.  Tetanus, diphtheria, and acellular pertussis (Tdap, Td) vaccine. You may need a Td booster every 10 years.  Zoster  vaccine. You may need this after age 62.  Pneumococcal 13-valent conjugate (PCV13) vaccine. One dose is recommended after age 79.  Pneumococcal polysaccharide (PPSV23) vaccine. One dose is recommended after age 70. Talk to your health care provider about which screenings and vaccines you need and how often you need them. This information is not intended to replace advice given to you by your health care provider. Make sure you discuss any questions you have with your health care provider. Document Released: 03/11/2015 Document Revised: 11/02/2015 Document Reviewed: 12/14/2014 Elsevier Interactive Patient Education  2017 [employer] Prevention in the Home Falls can cause injuries. They can happen to people of all ages. There are many things you can do to make your home safe and to help prevent falls. What can I do on the outside of my home?  Regularly fix the edges of walkways and driveways and fix any cracks.  Remove anything that might make you trip as you walk through a door, such as a raised step or threshold.  Trim any bushes or trees on the path to your home.  Use bright outdoor lighting.  Clear any walking paths of anything that might make someone trip, such as rocks or tools.  Regularly check to see if handrails are loose or broken. Make sure that both sides of any steps have handrails.  Any raised decks and porches should have guardrails on the edges.  Have any leaves, snow, or ice cleared regularly.  Use sand or salt on walking paths during winter.  Clean up any spills in your garage right away. This includes oil or grease spills. What can I do in the bathroom?  Use night lights.  Install grab bars by the toilet and in the tub and shower. Do not use towel bars as grab bars.  Use non-skid mats or decals in the tub or shower.  If you need to sit down in the shower, use a plastic, non-slip stool.  Keep the floor dry. Clean up any water that spills on the  floor as soon as it happens.  Remove soap buildup in the tub or shower regularly.  Attach bath mats securely with double-sided non-slip rug tape.  Do not have throw rugs and other things on the floor that can make you trip. What can I do in the bedroom?  Use night lights.  Make sure that you have a light by your bed that is easy to reach.  Do not use any sheets or blankets that are too big for your bed. They should not hang down onto the floor.  Have a firm chair that has side arms. You can use this for support while you get dressed.  Do not have throw rugs and other things on the floor that can make you trip. What can I do in the kitchen?  Clean up any spills right away.  Avoid walking on wet floors.  Keep items that you use a lot in easy-to-reach places.  If you need to reach something above you, use a strong step stool that has a grab bar.  Keep electrical cords out of the way.  Do not use floor polish or wax that makes floors slippery. If you must use wax, use non-skid  floor wax.  Do not have throw rugs and other things on the floor that can make you trip. What can I do with my stairs?  Do not leave any items on the stairs.  Make sure that there are handrails on both sides of the stairs and use them. Fix handrails that are broken or loose. Make sure that handrails are as long as the stairways.  Check any carpeting to make sure that it is firmly attached to the stairs. Fix any carpet that is loose or worn.  Avoid having throw rugs at the top or bottom of the stairs. If you do have throw rugs, attach them to the floor with carpet tape.  Make sure that you have a light switch at the top of the stairs and the bottom of the stairs. If you do not have them, ask someone to add them for you. What else can I do to help prevent falls?  Wear shoes that:  Do not have high heels.  Have rubber bottoms.  Are comfortable and fit you well.  Are closed at the toe. Do not wear  sandals.  If you use a stepladder:  Make sure that it is fully opened. Do not climb a closed stepladder.  Make sure that both sides of the stepladder are locked into place.  Ask someone to hold it for you, if possible.  Clearly mark and make sure that you can see:  Any grab bars or handrails.  First and last steps.  Where the edge of each step is.  Use tools that help you move around (mobility aids) if they are needed. These include:  Canes.  Walkers.  Scooters.  Crutches.  Turn on the lights when you go into a dark area. Replace any light bulbs as soon as they burn out.  Set up your furniture so you have a clear path. Avoid moving your furniture around.  If any of your floors are uneven, fix them.  If there are any pets around you, be aware of where they are.  Review your medicines with your doctor. Some medicines can make you feel dizzy. This can increase your chance of falling. Ask your doctor what other things that you can do to help prevent falls. This information is not intended to replace advice given to you by your health care provider. Make sure you discuss any questions you have with your health care provider. Document Released: 12/09/2008 Document Revised: 07/21/2015 Document Reviewed: 03/19/2014 Elsevier Interactive Patient Education  2017 [employer].

## 2018-08-05 DIAGNOSIS — R69 Illness, unspecified: Secondary | ICD-10-CM | POA: Diagnosis not present

## 2018-08-18 DIAGNOSIS — R69 Illness, unspecified: Secondary | ICD-10-CM | POA: Diagnosis not present

## 2018-08-25 DIAGNOSIS — R4020 Unspecified coma: Secondary | ICD-10-CM | POA: Diagnosis not present

## 2018-08-25 DIAGNOSIS — T782XXA Anaphylactic shock, unspecified, initial encounter: Secondary | ICD-10-CM | POA: Diagnosis not present

## 2018-08-25 DIAGNOSIS — E876 Hypokalemia: Secondary | ICD-10-CM | POA: Diagnosis not present

## 2018-08-25 DIAGNOSIS — T7840XA Allergy, unspecified, initial encounter: Secondary | ICD-10-CM | POA: Diagnosis not present

## 2018-08-25 DIAGNOSIS — R0902 Hypoxemia: Secondary | ICD-10-CM | POA: Diagnosis not present

## 2018-08-25 DIAGNOSIS — R55 Syncope and collapse: Secondary | ICD-10-CM | POA: Diagnosis not present

## 2018-08-25 DIAGNOSIS — L299 Pruritus, unspecified: Secondary | ICD-10-CM | POA: Diagnosis not present

## 2018-08-28 ENCOUNTER — Ambulatory Visit (INDEPENDENT_AMBULATORY_CARE_PROVIDER_SITE_OTHER): Payer: Medicare HMO | Admitting: Internal Medicine

## 2018-08-28 ENCOUNTER — Other Ambulatory Visit: Payer: Self-pay

## 2018-08-28 ENCOUNTER — Encounter: Payer: Self-pay | Admitting: Internal Medicine

## 2018-08-28 VITALS — BP 142/84 | HR 74 | Ht 64.0 in | Wt 149.2 lb

## 2018-08-28 DIAGNOSIS — T782XXA Anaphylactic shock, unspecified, initial encounter: Secondary | ICD-10-CM | POA: Insufficient documentation

## 2018-08-28 DIAGNOSIS — L299 Pruritus, unspecified: Secondary | ICD-10-CM

## 2018-08-28 DIAGNOSIS — E876 Hypokalemia: Secondary | ICD-10-CM

## 2018-08-28 DIAGNOSIS — T782XXD Anaphylactic shock, unspecified, subsequent encounter: Secondary | ICD-10-CM | POA: Diagnosis not present

## 2018-08-28 NOTE — Progress Notes (Signed)
Date:  08/28/2018   Name:  Alexandra Watson   DOB:  December 26, 1945   MRN:  [id number]   Chief Complaint: Rash (Itching rash. Went to [place] this past weekend. Sunday night starting itching in hands, feet, and scalp. Passed out, husband caught her. 911 was called. [place] Tried to get up to go the bathroom and passed out again in the hospital room. Had low potassium, low magnesium. Had a beef hamburger at dinner. )  Allergic Reaction This is a new problem. The current episode started 5 to 7 days ago. The problem has been gradually improving since onset. Associated with: different foods so unsure of cause but did have a seafood pasta at lunch. Associated symptoms include difficulty breathing, itching, a rash and trouble swallowing. Pertinent negatives include no chest pain, coughing or wheezing. (Syncope) Treatments tried: seen at ED - given benadryl, prednisone and pepcid, but she has not taken any of these.  She had a low potassium and slight increase in creatinine.  She received IVF and was able to go home.  Since being home she continues to itch on her back and hips but otherwise feels well.  She is very anxious about a recurrence.  An Epipen was sent to [facility] but she has not picked up yet.  Review of Systems  Constitutional: Negative for chills, fatigue and fever.  HENT: Positive for trouble swallowing. Negative for facial swelling.   Respiratory: Negative for cough, chest tightness, shortness of breath and wheezing.   Cardiovascular: Negative for chest pain and palpitations.  Skin: Positive for itching and rash.       itching  Neurological: Negative for dizziness and headaches.  Psychiatric/Behavioral: Negative for dysphoric mood and sleep disturbance. The patient is not nervous/anxious.     Patient Active Problem List   Diagnosis Date Noted  . Anxiety disorder 11/02/2017  . Weight loss, abnormal 10/11/2017  . Neuropathy 07/06/2016  . SUI (stress urinary  incontinence, female) 11/11/2015  . Incomplete emptying of bladder 11/11/2015  . Recurrent UTI 11/10/2015  . Fracture of ankle, trimalleolar, left, closed 08/08/2015  . Arthritis of knee, left 07/01/2015  . Insomnia 06/30/2015  . Atrophic vaginitis 06/30/2015  . Gastroesophageal reflux disease 06/30/2015  . Osteopenia 05/20/2015  . Anemia 09/14/2012  . Constipation 08/27/2012  . Essential hypertension 10/04/2011  . Hyperlipidemia, mixed 10/04/2011  . 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 Iodinated Diagnostic Agents Hives  . Oxycodone Nausea And Vomiting and Nausea Only    Past Surgical History:  Procedure Laterality Date  . ANKLE ARTHROSCOPY WITH OPEN REDUCTION INTERNAL FIXATION (ORIF)  07/2015   tri-malleolar  . BUNIONECTOMY Bilateral 2001, 2005  . CHOLECYSTECTOMY    . COLONOSCOPY  05/2010   normal  . GANGLION CYST EXCISION Left   . THORACOTOMY / DECORTICATION PARIETAL PLEURA Left 06/2017   [facility]  . 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 Smoking status: Former Smoker    Packs/day: 0.25    Years: 20.00    Pack years: 5.00    Types: Cigarettes    Quit date: 1985    Years since quitting: 35.5  . Smokeless tobacco: Never Used  . Tobacco comment: smoking cessation materials not required  Substance Use Topics  . Alcohol use: Yes    Alcohol/week: 1.0 standard drinks    Types: 1 Glasses of wine  per week  . Drug use: No     Medication list has been reviewed and updated.  Current Meds  Medication Sig  . Calcium Carbonate-Vit D-Min (CALCIUM 1200 PO) Take 2,400 mg by mouth.  . CHOLECALCIFEROL PO Take 1 tablet by mouth daily.  Marland Kitchen gabapentin (NEURONTIN) 300 MG capsule Take 1 capsule (300 mg total) by mouth daily.  . hydrochlorothiazide (HYDRODIURIL) 25 MG tablet TAKE 1 TABLET DAILY  . Melatonin 10 MG TABS Take by mouth at bedtime.  . metoprolol tartrate (LOPRESSOR) 50  MG tablet TAKE 1 TABLET DAILY (Patient taking differently: 50 mg. )  . Omega-3 Fatty Acids (FISH OIL) 1000 MG CAPS Take by mouth.  . polyethylene glycol (MIRALAX / GLYCOLAX) packet Take 17 g by mouth daily.  . sertraline (ZOLOFT) 25 MG tablet TAKE 1 TABLET BY MOUTH EVERY DAY  . simvastatin (ZOCOR) 20 MG tablet TAKE 1 TABLET DAILY  . traZODone (DESYREL) 50 MG tablet Take 50 mg by mouth at bedtime.    PHQ 2/9 Scores 08/28/2018 07/14/2018 07/08/2018 01/14/2018  PHQ - 2 Score 0 0 0 0  PHQ- 9 Score 1 - - -    BP Readings from Last 3 Encounters:  08/28/18 (!) 142/84  07/08/18 132/64  01/14/18 128/64    Physical Exam Vitals signs and nursing note reviewed.  Constitutional:      General: She is not in acute distress.    Appearance: She is well-developed.  HENT:     Head: Normocephalic and atraumatic.     Mouth/Throat:     Lips: Pink.     Mouth: Mucous membranes are moist.  Neck:     Musculoskeletal: Normal range of motion and neck supple.  Cardiovascular:     Rate and Rhythm: Normal rate and regular rhythm.  No extrasystoles are present. Pulmonary:     Effort: Pulmonary effort is normal. No respiratory distress.     Breath sounds: Normal breath sounds and air entry. No wheezing or rhonchi.  Musculoskeletal: Normal range of motion.     Right lower leg: No edema.     Left lower leg: No edema.  Lymphadenopathy:     Cervical: No cervical adenopathy.  Skin:    General: Skin is warm and dry.     Capillary Refill: Capillary refill takes less than 2 seconds.     Findings: No rash.  Neurological:     Mental Status: She is alert and oriented to person, place, and time.  Psychiatric:        Attention and Perception: Attention normal.        Mood and Affect: Mood normal.        Behavior: Behavior normal.        Thought Content: Thought content normal.     Wt Readings from Last 3 Encounters:  08/28/18 149 lb 3.2 oz (67.7 kg)  07/08/18 151 lb 6.4 oz (68.7 kg)  01/14/18 141 lb (64 kg)     BP (!) 142/84   Pulse 74   Ht 5\' 4"  (1.626 m)   Wt 149 lb 3.2 oz (67.7 kg)   SpO2 97%   BMI 25.61 kg/m   Assessment and Plan: 1. Anaphylaxis, subsequent encounter Likely to seafood consumed that day Pick up Epipen Referral to Allergy specialist - Ambulatory referral to Allergy  2. Pruritus Benadryl at bedtime Pepcid bid  3. Hypokalemia Recheck labs - Basic metabolic panel   Partially dictated using Editor, commissioning. Any errors are unintentional.  Halina Maidens, MD [place]  [facility]  08/28/2018

## 2018-08-28 NOTE — Patient Instructions (Addendum)
Take Pepcid AC twice a day  Take Benadryl 25 mg at bedtime to reduce itching  Epinephrine injection (Auto-injector) What is this medicine? EPINEPHRINE (ep i NEF rin) is used for the emergency treatment of severe allergic reactions. You should keep this medicine with you at all times. This medicine may be used for other purposes; ask your health care provider or pharmacist if you have questions. COMMON BRAND NAME(S): Adrenaclick, Auvi-Q, epinephrinesnap, epinephrinesnap-v, EpiPen, EPIsnap Epinephrine, SYMJEPI, Twinject What should I tell my health care provider before I take this medicine? They need to know if you have any of the following conditions:  diabetes  heart disease  high blood pressure  lung or breathing disease, like asthma  Parkinson's disease  thyroid disease  an unusual or allergic reaction to epinephrine, sulfites, other medicines, foods, dyes, or preservatives  pregnant or trying to get pregnant  breast-feeding How should I use this medicine? This medicine is for injection into the outer thigh. Your doctor or health care professional will instruct you on the proper use of the device during an emergency. Read all directions carefully and make sure you understand them. Do not use more often than directed. Talk to your pediatrician regarding the use of this medicine in children. Special care may be needed. This drug is commonly used in children. A special device is available for use in children. If you are giving this medicine to a young child, hold their leg firmly in place before and during the injection to prevent injury. Overdosage: If you think you have taken too much of this medicine contact a poison control center or emergency room at once. NOTE: This medicine is only for you. Do not share this medicine with others. What if I miss a dose? This does not apply. You should only use this medicine for an allergic reaction. What may interact with this medicine? This  medicine is only used during an emergency. Significant drug interactions are not likely during emergency use. This list may not describe all possible interactions. Give your health care provider a list of all the medicines, herbs, non-prescription drugs, or dietary supplements you use. Also tell them if you smoke, drink alcohol, or use illegal drugs. Some items may interact with your medicine. What should I watch for while using this medicine? Keep this medicine ready for use in the case of a severe allergic reaction. Make sure that you have the phone number of your doctor or health care professional and local hospital ready. Remember to check the expiration date of your medicine regularly. You may need to have additional units of this medicine with you at work, school, or other places. Talk to your doctor or health care professional about your need for extra units. Some emergencies may require an additional dose. Check with your doctor or a health care professional before using an extra dose. After use, go to the nearest hospital or call 911. Avoid physical activity. Make sure the treating health care professional knows you have received an injection of this medicine. You will receive additional instructions on what to do during and after use of this medicine before a medical emergency occurs. What side effects may I notice from receiving this medicine? Side effects that you should report to your doctor or health care professional as soon as possible:  allergic reactions like skin rash, itching or hives, swelling of the face, lips, or tongue  breathing problems  chest pain  fast, irregular heartbeat  pain, tingling, numbness in the hands  or feet  pain, redness, or irritation at site where injected  vomiting Side effects that usually do not require medical attention (report to your doctor or health care professional if they continue or are bothersome):  anxious  dizziness  dry  mouth  headache  increased sweating  nausea  unusually weak or tired This list may not describe all possible side effects. Call your doctor for medical advice about side effects. You may report side effects to FDA at 1-800-FDA-1088. Where should I keep my medicine? Keep out of the reach of children. Store at room temperature between 15 and 30 degrees C (59 and 86 degrees F). Protect from light and heat. The solution should be clear in color. If the solution is discolored or contains particles it must be replaced. Throw away any unused medicine after the expiration date. Ask your doctor or pharmacist about proper disposal of the injector if it is expired or has been used. Always replace your auto-injector before it expires. NOTE: This sheet is a summary. It may not cover all possible information. If you have questions about this medicine, talk to your doctor, pharmacist, or health care provider.  2020 Elsevier/Gold Standard (2014-07-19 12:24:50)

## 2018-08-29 LAB — BASIC METABOLIC PANEL
BUN/Creatinine Ratio: 18 (ref 12–28)
BUN: 15 mg/dL (ref 8–27)
CO2: 25 mmol/L (ref 20–29)
Calcium: 10.2 mg/dL (ref 8.7–10.3)
Chloride: 99 mmol/L (ref 96–106)
Creatinine, Ser: 0.83 mg/dL (ref 0.57–1.00)
GFR calc Af Amer: 80 mL/min/{1.73_m2} (ref >59)
GFR calc non Af Amer: 70 mL/min/{1.73_m2} (ref >59)
Glucose: 93 mg/dL (ref 65–99)
Potassium: 4.2 mmol/L (ref 3.5–5.2)
Sodium: 139 mmol/L (ref 134–144)

## 2018-09-02 ENCOUNTER — Ambulatory Visit: Payer: Medicare HMO

## 2018-09-17 DIAGNOSIS — M1712 Unilateral primary osteoarthritis, left knee: Secondary | ICD-10-CM | POA: Diagnosis not present

## 2018-10-06 ENCOUNTER — Other Ambulatory Visit: Payer: Self-pay | Admitting: Internal Medicine

## 2018-10-08 DIAGNOSIS — T7800XA Anaphylactic reaction due to unspecified food, initial encounter: Secondary | ICD-10-CM | POA: Insufficient documentation

## 2018-10-08 DIAGNOSIS — X58XXXA Exposure to other specified factors, initial encounter: Secondary | ICD-10-CM | POA: Diagnosis not present

## 2018-10-20 ENCOUNTER — Other Ambulatory Visit: Payer: Self-pay | Admitting: Internal Medicine

## 2018-10-20 DIAGNOSIS — F418 Other specified anxiety disorders: Secondary | ICD-10-CM

## 2018-11-10 ENCOUNTER — Ambulatory Visit
Admission: RE | Admit: 2018-11-10 | Discharge: 2018-11-10 | Disposition: A | Discharge status: Home / Self Care | Payer: Medicare HMO | Source: Ambulatory Visit | Attending: Internal Medicine | Admitting: Internal Medicine

## 2018-11-10 ENCOUNTER — Other Ambulatory Visit: Payer: Self-pay

## 2018-11-10 DIAGNOSIS — Z1231 Encounter for screening mammogram for malignant neoplasm of breast: Secondary | ICD-10-CM | POA: Insufficient documentation

## 2018-12-01 DIAGNOSIS — B351 Tinea unguium: Secondary | ICD-10-CM | POA: Diagnosis not present

## 2018-12-01 DIAGNOSIS — L821 Other seborrheic keratosis: Secondary | ICD-10-CM | POA: Diagnosis not present

## 2018-12-01 DIAGNOSIS — L578 Other skin changes due to chronic exposure to nonionizing radiation: Secondary | ICD-10-CM | POA: Diagnosis not present

## 2018-12-01 DIAGNOSIS — L918 Other hypertrophic disorders of the skin: Secondary | ICD-10-CM | POA: Diagnosis not present

## 2018-12-01 DIAGNOSIS — L720 Epidermal cyst: Secondary | ICD-10-CM | POA: Diagnosis not present

## 2018-12-01 DIAGNOSIS — L570 Actinic keratosis: Secondary | ICD-10-CM | POA: Diagnosis not present

## 2018-12-01 DIAGNOSIS — D1800 Hemangioma unspecified site: Secondary | ICD-10-CM | POA: Diagnosis not present

## 2018-12-03 ENCOUNTER — Ambulatory Visit (INDEPENDENT_AMBULATORY_CARE_PROVIDER_SITE_OTHER): Payer: Medicare HMO | Admitting: Internal Medicine

## 2018-12-03 ENCOUNTER — Other Ambulatory Visit: Payer: Self-pay

## 2018-12-03 ENCOUNTER — Encounter: Payer: Self-pay | Admitting: Internal Medicine

## 2018-12-03 VITALS — BP 132/74 | HR 93 | Ht 64.0 in | Wt 151.0 lb

## 2018-12-03 DIAGNOSIS — R251 Tremor, unspecified: Secondary | ICD-10-CM | POA: Diagnosis not present

## 2018-12-03 DIAGNOSIS — M79671 Pain in right foot: Secondary | ICD-10-CM | POA: Diagnosis not present

## 2018-12-03 DIAGNOSIS — E782 Mixed hyperlipidemia: Secondary | ICD-10-CM | POA: Diagnosis not present

## 2018-12-03 DIAGNOSIS — I10 Essential (primary) hypertension: Secondary | ICD-10-CM | POA: Diagnosis not present

## 2018-12-03 DIAGNOSIS — F418 Other specified anxiety disorders: Secondary | ICD-10-CM

## 2018-12-03 DIAGNOSIS — R4589 Other symptoms and signs involving emotional state: Secondary | ICD-10-CM

## 2018-12-03 DIAGNOSIS — R69 Illness, unspecified: Secondary | ICD-10-CM | POA: Diagnosis not present

## 2018-12-03 DIAGNOSIS — R499 Unspecified voice and resonance disorder: Secondary | ICD-10-CM

## 2018-12-03 MED ORDER — HYDROCHLOROTHIAZIDE 25 MG PO TABS: 25.0000 mg | ORAL_TABLET | Freq: Every day | ORAL | 1 refills | Status: DC

## 2018-12-03 MED ORDER — GABAPENTIN 300 MG PO CAPS: 300.0000 mg | ORAL_CAPSULE | Freq: Every day | ORAL | 1 refills | Status: DC

## 2018-12-03 MED ORDER — METOPROLOL TARTRATE 50 MG PO TABS: 25.0000 mg | ORAL_TABLET | Freq: Every day | ORAL | 1 refills | Status: DC

## 2018-12-03 MED ORDER — SERTRALINE HCL 25 MG PO TABS: 25.0000 mg | ORAL_TABLET | Freq: Every day | ORAL | 1 refills | Status: DC

## 2018-12-03 MED ORDER — SIMVASTATIN 20 MG PO TABS: 20.0000 mg | ORAL_TABLET | Freq: Every day | ORAL | 1 refills | Status: DC

## 2018-12-03 NOTE — Progress Notes (Signed)
Date:  12/03/2018   Name:  Alexandra Watson   DOB:  03/12/1945   MRN:  [id number]   Chief Complaint: Edema (Right foot swelling. 2 weeks. Soaping in epsin salts and helping. Sore still but otherwise better. ) and weakness in voice (Husband says her mouth quivers when she doesn't notice. Feels like her voice is weakened and getting worse. Started a few months ago.)  Foot Injury  There was no injury mechanism (but she did walk for exercise on Sunday). The pain is present in the right foot. The quality of the pain is described as aching (and swelling). The pain is mild. The pain has been improving since onset. Pertinent negatives include no inability to bear weight, loss of sensation, numbness or tingling. The symptoms are aggravated by weight bearing. She has tried elevation (and soaking) for the symptoms. The treatment provided moderate relief.   Tremor - Husband has noticed that her lower lip shakes while she is sitting reading or watching TV.  She has not noticed this but does admit that she sometimes feels that her head is trembling.  She also complains of her thumbs twitching which she attributes to OA.  She denies trouble reading, loss of balance or falls, double vision, difficulty writing or performing ADLs.  Voice change - Over the past few months her family has noticed that her voice will become slightly weak and raspy.  She never loses her voice completely.  She has no throat pain or dysphagia.  She has mild gerd at times relieved by [facility].  She has allergies and PND but has not been taking any medications for this on a regular basis.  Review of Systems  Constitutional: Negative for chills, fatigue, fever and unexpected weight change.  Respiratory: Negative for cough, choking, chest tightness, shortness of breath and wheezing.   Cardiovascular: Negative for chest pain, palpitations and leg swelling.  Gastrointestinal: Negative for abdominal pain, blood in stool, constipation and  diarrhea.       Intermittent gerd  Musculoskeletal: Positive for arthralgias (in hands and wrists and left knee).  Neurological: Positive for tremors. Negative for dizziness, tingling, weakness, numbness and headaches.  Psychiatric/Behavioral: Negative for dysphoric mood and sleep disturbance. The patient is not nervous/anxious.     Patient Active Problem List   Diagnosis Date Noted  . Anaphylaxis due to food, initial encounter 10/08/2018  . Primary osteoarthritis of left knee 09/17/2018  . Anaphylactic syndrome 08/28/2018  . Anxiety disorder 11/02/2017  . Weight loss, abnormal 10/11/2017  . Neuropathy 07/06/2016  . SUI (stress urinary incontinence, female) 11/11/2015  . Incomplete emptying of bladder 11/11/2015  . Recurrent UTI 11/10/2015  . Fracture of ankle, trimalleolar, left, closed 08/08/2015  . Arthritis of knee, left 07/01/2015  . Insomnia 06/30/2015  . Atrophic vaginitis 06/30/2015  . Gastroesophageal reflux disease 06/30/2015  . Osteopenia 05/20/2015  . Anemia 09/14/2012  . Constipation 08/27/2012  . Essential hypertension 10/04/2011  . Hyperlipidemia, mixed 10/04/2011  . 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 Iodinated Diagnostic Agents Hives  . Oxycodone Nausea And Vomiting and Nausea Only    Past Surgical History:  Procedure Laterality Date  . ANKLE ARTHROSCOPY WITH OPEN REDUCTION INTERNAL FIXATION (ORIF)  07/2015   tri-malleolar  . BUNIONECTOMY Bilateral 2001, 2005  . CHOLECYSTECTOMY    . COLONOSCOPY  05/2010   normal  . GANGLION CYST EXCISION Left   . THORACOTOMY / DECORTICATION PARIETAL PLEURA Left 06/2017  [facility]  . 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 Smoking status: Former Smoker    Packs/day: 0.25    Years: 20.00    Pack years: 5.00    Types: Cigarettes    Quit date: 1985    Years since quitting: 35.7  . Smokeless tobacco: Never Used  .  Tobacco comment: smoking cessation materials not required  Substance Use Topics  . Alcohol use: Yes    Alcohol/week: 1.0 standard drinks    Types: 1 Glasses of wine per week  . Drug use: No     Medication list has been reviewed and updated.  Current Meds  Medication Sig  . Calcium Carbonate-Vit D-Min (CALCIUM 1200 PO) Take 2,400 mg by mouth.  . CHOLECALCIFEROL PO Take 1 tablet by mouth daily.  Marland Kitchen EPINEPHrine 0.3 mg/0.3 mL IJ SOAJ injection INJECT 0.3 MLS (0.3 MG TOTAL) INTO THE MUSCLE AS NEEDED (ANAPHYLAXIS). FOR UP TO 1 DOSE  . famotidine (PEPCID) 20 MG tablet Take by mouth.  . gabapentin (NEURONTIN) 300 MG capsule Take 1 capsule (300 mg total) by mouth daily.  . hydrochlorothiazide (HYDRODIURIL) 25 MG tablet TAKE 1 TABLET DAILY  . Melatonin 10 MG TABS Take by mouth at bedtime.  . metoprolol tartrate (LOPRESSOR) 50 MG tablet TAKE 1 TABLET DAILY (Patient taking differently: 50 mg. )  . Omega-3 Fatty Acids (FISH OIL) 1000 MG CAPS Take by mouth.  . polyethylene glycol (MIRALAX / GLYCOLAX) packet Take 17 g by mouth daily.    PHQ 2/9 Scores 12/03/2018 08/28/2018 07/14/2018 07/08/2018  PHQ - 2 Score 0 0 0 0  PHQ- 9 Score 0 1 - -    BP Readings from Last 3 Encounters:  12/03/18 132/74  08/28/18 (!) 142/84  07/08/18 132/64    Physical Exam Vitals signs and nursing note reviewed.  Constitutional:      General: She is not in acute distress.    Appearance: Normal appearance. She is well-developed.  HENT:     Head: Normocephalic and atraumatic.     Mouth/Throat:     Mouth: Mucous membranes are moist.     Pharynx: No oropharyngeal exudate or posterior oropharyngeal erythema.  Cardiovascular:     Rate and Rhythm: Normal rate and regular rhythm.     Pulses: Normal pulses.     Heart sounds: No murmur.  Pulmonary:     Effort: Pulmonary effort is normal. No respiratory distress.  Musculoskeletal: Normal range of motion.        General: Swelling (over right mid foot - very mild, not hot  or tender) present.     Right lower leg: No edema.     Left lower leg: No edema.  Lymphadenopathy:     Cervical: No cervical adenopathy.  Skin:    General: Skin is warm and dry.     Capillary Refill: Capillary refill takes less than 2 seconds.     Findings: No rash.  Neurological:     Mental Status: She is alert and oriented to person, place, and time.     Sensory: Sensation is intact.     Motor: Abnormal muscle tone (mild cogwheeling in both UEs) present. Tremors: fine resting tremor of both hands and head.     Coordination: Coordination normal. Finger-Nose-Finger Test normal.     Gait: Gait normal.  Psychiatric:        Attention and Perception: Attention normal.  Mood and Affect: Mood normal.        Speech: Speech normal.        Behavior: Behavior normal.        Thought Content: Thought content normal.     Wt Readings from Last 3 Encounters:  12/03/18 151 lb (68.5 kg)  08/28/18 149 lb 3.2 oz (67.7 kg)  07/08/18 151 lb 6.4 oz (68.7 kg)    BP 132/74   Pulse 93   Ht 5\' 4"  (1.626 m)   Wt 151 lb (68.5 kg)   SpO2 98%   BMI 25.92 kg/m   Assessment and Plan: 1. Change in voice Begin zyrtec daily and if no improvement in 2 weeks will need to refer to ENT (not Dr. Kathyrn Sheriff)  2. Tremor of face and hands Concern for tremor of Parkinson's - Ambulatory referral to Neurology  3. Foot pain, right Suspect mid foot strain - continue support wrap, soaking and elevation  4. Essential hypertension Clinically stable exam with well controlled BP.   Tolerating medications, hctz and metoprolol 25 mg daily, without side effects at this time. Pt to continue current regimen and low sodium diet; benefits of regular exercise as able discussed. - metoprolol tartrate (LOPRESSOR) 50 MG tablet; Take 0.5 tablets (25 mg total) by mouth daily.  Dispense: 90 tablet; Refill: 1 - hydrochlorothiazide (HYDRODIURIL) 25 MG tablet; Take 1 tablet (25 mg total) by mouth daily.  Dispense: 90 tablet;  Refill: 1  5. Hyperlipidemia, mixed Tolerating statin medications without side effects - simvastatin (ZOCOR) 20 MG tablet; Take 1 tablet (20 mg total) by mouth daily.  Dispense: 90 tablet; Refill: 1  6. Anxiety about health Pt briefly stopped zoloft in case it was contributing to her tremor and foot pain She did feel like it reduced her anxiety - I recommend that she resume 25 mg daily. - sertraline (ZOLOFT) 25 MG tablet; Take 1 tablet (25 mg total) by mouth daily.  Dispense: 90 tablet; Refill: 1   Partially dictated using Editor, commissioning. Any errors are unintentional.  Halina Maidens, MD [facility]  12/03/2018

## 2018-12-03 NOTE — Patient Instructions (Signed)
Take cetirazine daily 1-2 weeks for drainage and voice changes.  If no improvement we will to refer to ENT for evaluation.  I want you to see a Neurology about your face and hand tremor

## 2018-12-11 ENCOUNTER — Other Ambulatory Visit: Payer: Self-pay

## 2018-12-11 ENCOUNTER — Ambulatory Visit (INDEPENDENT_AMBULATORY_CARE_PROVIDER_SITE_OTHER): Payer: Medicare HMO

## 2018-12-11 DIAGNOSIS — Z23 Encounter for immunization: Secondary | ICD-10-CM

## 2018-12-23 DIAGNOSIS — M1712 Unilateral primary osteoarthritis, left knee: Secondary | ICD-10-CM | POA: Diagnosis not present

## 2018-12-23 DIAGNOSIS — M25562 Pain in left knee: Secondary | ICD-10-CM | POA: Diagnosis not present

## 2018-12-23 DIAGNOSIS — G5602 Carpal tunnel syndrome, left upper limb: Secondary | ICD-10-CM | POA: Diagnosis not present

## 2018-12-23 DIAGNOSIS — G8929 Other chronic pain: Secondary | ICD-10-CM | POA: Diagnosis not present

## 2018-12-23 DIAGNOSIS — M79671 Pain in right foot: Secondary | ICD-10-CM | POA: Diagnosis not present

## 2019-01-02 DIAGNOSIS — R499 Unspecified voice and resonance disorder: Secondary | ICD-10-CM | POA: Diagnosis not present

## 2019-01-02 DIAGNOSIS — Z87891 Personal history of nicotine dependence: Secondary | ICD-10-CM | POA: Diagnosis not present

## 2019-01-02 DIAGNOSIS — C7A090 Malignant carcinoid tumor of the bronchus and lung: Secondary | ICD-10-CM | POA: Diagnosis not present

## 2019-01-12 ENCOUNTER — Other Ambulatory Visit: Payer: Self-pay

## 2019-01-12 ENCOUNTER — Ambulatory Visit (INDEPENDENT_AMBULATORY_CARE_PROVIDER_SITE_OTHER): Payer: Medicare HMO | Admitting: Internal Medicine

## 2019-01-12 ENCOUNTER — Encounter: Payer: Self-pay | Admitting: Internal Medicine

## 2019-01-12 VITALS — BP 136/78 | HR 78 | Ht 64.0 in | Wt 150.8 lb

## 2019-01-12 DIAGNOSIS — Z0000 Encounter for general adult medical examination without abnormal findings: Secondary | ICD-10-CM | POA: Diagnosis not present

## 2019-01-12 DIAGNOSIS — K2100 Gastro-esophageal reflux disease with esophagitis, without bleeding: Secondary | ICD-10-CM | POA: Diagnosis not present

## 2019-01-12 DIAGNOSIS — E782 Mixed hyperlipidemia: Secondary | ICD-10-CM | POA: Diagnosis not present

## 2019-01-12 DIAGNOSIS — M8580 Other specified disorders of bone density and structure, unspecified site: Secondary | ICD-10-CM | POA: Diagnosis not present

## 2019-01-12 DIAGNOSIS — Z91018 Allergy to other foods: Secondary | ICD-10-CM

## 2019-01-12 DIAGNOSIS — F411 Generalized anxiety disorder: Secondary | ICD-10-CM

## 2019-01-12 DIAGNOSIS — I10 Essential (primary) hypertension: Secondary | ICD-10-CM | POA: Diagnosis not present

## 2019-01-12 DIAGNOSIS — R69 Illness, unspecified: Secondary | ICD-10-CM | POA: Diagnosis not present

## 2019-01-12 LAB — POCT URINALYSIS DIPSTICK
Bilirubin, UA: NEGATIVE
Blood, UA: NEGATIVE
Clarity, UA: NEGATIVE
Color, UA: NEGATIVE
Glucose, UA: NEGATIVE
Ketones, UA: NEGATIVE
Nitrite, UA: NEGATIVE
Protein, UA: NEGATIVE
Spec Grav, UA: 1.02 (ref 1.010–1.025)
Urobilinogen, UA: 0.2 E.U./dL
pH, UA: 8 (ref 5.0–8.0)

## 2019-01-12 NOTE — Progress Notes (Signed)
Date:  01/12/2019   Name:  Alexandra Watson   DOB:  March 20, 1945   MRN:  [id number]   Chief Complaint: Annual Exam (Breast Exam.) and Hypertension Alexandra Watson is a 73 y.o. female who presents today for her Complete Annual Exam. She feels fairly well. She reports exercising walking daily. She reports she is sleeping fairly well.   Mammogram 10/2018 DEXA 05/2017 Colonoscopy  05/2010 Immunizations - UTD  Hypertension This is a chronic problem. The problem is controlled. Associated symptoms include anxiety. Pertinent negatives include no chest pain, headaches, palpitations or shortness of breath. Past treatments include diuretics and beta blockers. The current treatment provides significant improvement.  Hyperlipidemia The problem is controlled. Pertinent negatives include no chest pain or shortness of breath. Current antihyperlipidemic treatment includes statins. The current treatment provides significant improvement of lipids. There are no compliance problems.   Gastroesophageal Reflux She complains of heartburn. She reports no abdominal pain, no chest pain, no coughing or no wheezing. This is a recurrent problem. The problem occurs occasionally. Pertinent negatives include no fatigue. She has tried a histamine-2 antagonist for the symptoms. The treatment provided significant relief.  Anxiety Presents for follow-up visit. Symptoms include nervous/anxious behavior. Patient reports no chest pain, dizziness, palpitations or shortness of breath. The severity of symptoms is mild. The quality of sleep is good.   Compliance with medications is 76-100% (doing better on zoloft 25 mg daily).  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  Lung scan looked good except for one small non-specific area so she is having a full body PET in February.  She gets very anxious before the scans and would like something to take to calm her. Tremor - has Neurology appointment next month.  Tremor  has not worsened and sometimes is not present.  It mainly affects her writing at times.  Lab Results  Component Value Date   CREATININE 0.83 08/28/2018   BUN 15 08/28/2018   NA 139 08/28/2018   K 4.2 08/28/2018   CL 99 08/28/2018   CO2 25 08/28/2018   Lab Results  Component Value Date   CHOL 132 07/08/2018   HDL 43 07/08/2018   LDLCALC 61 07/08/2018   TRIG 139 07/08/2018   CHOLHDL 3.1 07/08/2018   Lab Results  Component Value Date   TSH 1.150 01/14/2018   No results found for: HGBA1C   Review of Systems  Constitutional: Negative for chills, fatigue and fever.  HENT: Negative for congestion, hearing loss, tinnitus, trouble swallowing and voice change.   Eyes: Negative for visual disturbance.  Respiratory: Negative for cough, chest tightness, shortness of breath and wheezing.   Cardiovascular: Negative for chest pain, palpitations and leg swelling.  Gastrointestinal: Positive for heartburn. Negative for abdominal pain, constipation, diarrhea and vomiting.  Endocrine: Negative for polydipsia and polyuria.  Genitourinary: Negative for dysuria, frequency, genital sores, vaginal bleeding and vaginal discharge.  Musculoskeletal: Positive for arthralgias (knee pain - had cortisone injection on left). Negative for gait problem and joint swelling.  Skin: Negative for color change and rash.  Neurological: Positive for tremors (stable - Neurology appt next month). Negative for dizziness, light-headedness and headaches.  Hematological: Negative for adenopathy. Does not bruise/bleed easily.  Psychiatric/Behavioral: Negative for dysphoric mood and sleep disturbance. The patient is nervous/anxious.     Patient Active Problem List   Diagnosis Date Noted  . Anaphylaxis due to food, initial encounter 10/08/2018  . Primary osteoarthritis of left knee 09/17/2018  .  Anaphylactic syndrome 08/28/2018  . Generalized anxiety disorder 11/02/2017  . Weight loss, abnormal 10/11/2017  . Neuropathy  07/06/2016  . SUI (stress urinary incontinence, female) 11/11/2015  . Incomplete emptying of bladder 11/11/2015  . Recurrent UTI 11/10/2015  . Arthritis of knee, left 07/01/2015  . Insomnia 06/30/2015  . Atrophic vaginitis 06/30/2015  . Gastroesophageal reflux disease 06/30/2015  . Osteopenia 05/20/2015  . Anemia 09/14/2012  . Constipation 08/27/2012  . Essential hypertension 10/04/2011  . Hyperlipidemia, mixed 10/04/2011  . History of malignant carcinoid tumor of bronchus 10/04/2011    Allergies  Allergen Reactions  . Galactose Anaphylaxis, Anxiety, Diarrhea, Itching and Shortness Of Breath  . Iodinated Diagnostic Agents Hives  . Oxycodone Nausea And Vomiting and Nausea Only    Past Surgical History:  Procedure Laterality Date  . ANKLE ARTHROSCOPY WITH OPEN REDUCTION INTERNAL FIXATION (ORIF)  07/2015   tri-malleolar  . BUNIONECTOMY Bilateral 2001, 2005  . CHOLECYSTECTOMY    . COLONOSCOPY  05/2010   normal  . GANGLION CYST EXCISION Left   . THORACOTOMY / DECORTICATION PARIETAL PLEURA Left 06/2017   [facility]  . 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 Smoking status: Former Smoker    Packs/day: 0.25    Years: 20.00    Pack years: 5.00    Types: Cigarettes    Quit date: 1985    Years since quitting: 35.8  . Smokeless tobacco: Never Used  . Tobacco comment: smoking cessation materials not required  Substance Use Topics  . Alcohol use: Yes    Alcohol/week: 1.0 standard drinks    Types: 1 Glasses of wine per week  . Drug use: No     Medication list has been reviewed and updated.  Current Meds  Medication Sig  . Calcium Carbonate-Vit D-Min (CALCIUM 1200 PO) Take 2,400 mg by mouth.  . CHOLECALCIFEROL PO Take 1 tablet by mouth daily.  Marland Kitchen EPINEPHrine 0.3 mg/0.3 mL IJ SOAJ injection INJECT 0.3 MLS (0.3 MG TOTAL) INTO THE MUSCLE AS NEEDED (ANAPHYLAXIS). FOR UP TO 1 DOSE  . gabapentin (NEURONTIN) 300 MG capsule  Take 1 capsule (300 mg total) by mouth daily.  . hydrochlorothiazide (HYDRODIURIL) 25 MG tablet Take 1 tablet (25 mg total) by mouth daily.  . Melatonin 10 MG TABS Take by mouth at bedtime.  . metoprolol tartrate (LOPRESSOR) 50 MG tablet Take 0.5 tablets (25 mg total) by mouth daily.  . Omega-3 Fatty Acids (FISH OIL) 1000 MG CAPS Take by mouth.  . polyethylene glycol (MIRALAX / GLYCOLAX) packet Take 17 g by mouth daily.  . sertraline (ZOLOFT) 25 MG tablet Take 1 tablet (25 mg total) by mouth daily.  . simvastatin (ZOCOR) 20 MG tablet Take 1 tablet (20 mg total) by mouth daily.  Marland Kitchen zinc gluconate 50 MG tablet Take 50 mg by mouth daily.    PHQ 2/9 Scores 01/12/2019 12/03/2018 08/28/2018 07/14/2018  PHQ - 2 Score 0 0 0 0  PHQ- 9 Score 0 0 1 -    BP Readings from Last 3 Encounters:  01/12/19 136/78  12/03/18 132/74  08/28/18 (!) 142/84    Physical Exam Vitals signs and nursing note reviewed.  Constitutional:      General: She is not in acute distress.    Appearance: She is well-developed.  HENT:     Head: Normocephalic and atraumatic.     Right Ear: Tympanic membrane and ear canal normal.  Left Ear: Tympanic membrane and ear canal normal.     Nose:     Right Sinus: No maxillary sinus tenderness.     Left Sinus: No maxillary sinus tenderness.  Eyes:     General: No scleral icterus.       Right eye: No discharge.        Left eye: No discharge.     Conjunctiva/sclera: Conjunctivae normal.  Neck:     Musculoskeletal: Normal range of motion. No erythema.     Thyroid: No thyromegaly.     Vascular: No carotid bruit.  Cardiovascular:     Rate and Rhythm: Normal rate and regular rhythm.     Pulses: Normal pulses.     Heart sounds: Normal heart sounds.  Pulmonary:     Effort: Pulmonary effort is normal. No respiratory distress.     Breath sounds: No wheezing.  Chest:     Breasts:        Right: No mass, nipple discharge, skin change or tenderness.        Left: No mass, nipple  discharge, skin change or tenderness.  Abdominal:     General: Bowel sounds are normal.     Palpations: Abdomen is soft.     Tenderness: There is no abdominal tenderness.  Musculoskeletal: Normal range of motion.  Lymphadenopathy:     Cervical: No cervical adenopathy.  Skin:    General: Skin is warm and dry.     Findings: No rash.  Neurological:     Mental Status: She is alert and oriented to person, place, and time.     Cranial Nerves: Cranial nerves are intact. No cranial nerve deficit.     Sensory: Sensation is intact. No sensory deficit.     Motor: No tremor (mild tremor seen previously not obvious today).     Deep Tendon Reflexes: Reflexes are normal and symmetric.  Psychiatric:        Speech: Speech normal.        Behavior: Behavior normal.        Thought Content: Thought content normal.     Wt Readings from Last 3 Encounters:  01/12/19 150 lb 12.8 oz (68.4 kg)  12/03/18 151 lb (68.5 kg)  08/28/18 149 lb 3.2 oz (67.7 kg)    BP 136/78   Pulse 78   Ht 5\' 4"  (1.626 m)   Wt 150 lb 12.8 oz (68.4 kg)   SpO2 96%   BMI 25.88 kg/m   Assessment and Plan: 1. Annual physical exam Normal exam; weight stable Continue healthy diet, regular exercise - POCT urinalysis dipstick - negative  2. Essential hypertension Clinically stable exam with well controlled BP.   Tolerating medications, metoprolol 25 mg and hctz 25 mg, without side effects at this time. Pt to continue current regimen and low sodium diet; benefits of regular exercise as able discussed. - CBC with Differential/Platelet - Comprehensive metabolic panel  3. Hyperlipidemia, mixed Tolerating statin medication without side effects at this time Continue same therapy without change at this time. - Lipid panel  4. Gastroesophageal reflux disease with esophagitis without hemorrhage Mild intermittent sx without red flags signs; responsive to PRN tums - CBC with Differential/Platelet  5. Generalized anxiety  disorder Clinically stable and doing well on Zoloft 25 mg daily Will continue current regimen - TSH + free T4  6. Osteopenia, unspecified location Followed by Orthopedics for OA of knee, ankle and thumb  7. Allergy to alpha-gal Has not had beef for 6 months -  allergist had mentioned that she could get the alpha-gal rechecked - Alpha-Gal Panel   Partially dictated using Dragon software. Any errors are unintentional.  Halina Maidens, MD [facility]  01/12/2019

## 2019-01-16 DIAGNOSIS — R490 Dysphonia: Secondary | ICD-10-CM | POA: Diagnosis not present

## 2019-01-16 DIAGNOSIS — J383 Other diseases of vocal cords: Secondary | ICD-10-CM | POA: Diagnosis not present

## 2019-01-16 DIAGNOSIS — R251 Tremor, unspecified: Secondary | ICD-10-CM | POA: Diagnosis not present

## 2019-01-16 DIAGNOSIS — C7A090 Malignant carcinoid tumor of the bronchus and lung: Secondary | ICD-10-CM | POA: Diagnosis not present

## 2019-01-16 DIAGNOSIS — R498 Other voice and resonance disorders: Secondary | ICD-10-CM | POA: Diagnosis not present

## 2019-01-16 DIAGNOSIS — D3A090 Benign carcinoid tumor of the bronchus and lung: Secondary | ICD-10-CM | POA: Diagnosis not present

## 2019-01-17 LAB — ALPHA-GAL PANEL
Alpha Gal IgE*: 6.14 kU/L — ABNORMAL HIGH (ref <0.10)
Beef (Bos spp) IgE: 1.35 kU/L — ABNORMAL HIGH (ref <0.35)
Class Interpretation: 1
Class Interpretation: 1
Class Interpretation: 2
Lamb/Mutton (Ovis spp) IgE: 0.39 kU/L — ABNORMAL HIGH (ref <0.35)
Pork (Sus spp) IgE: 0.4 kU/L — ABNORMAL HIGH (ref <0.35)

## 2019-01-17 LAB — CBC WITH DIFFERENTIAL/PLATELET
Basophils Absolute: 0.1 10*3/uL (ref 0.0–0.2)
Basos: 1 %
EOS (ABSOLUTE): 0.1 10*3/uL (ref 0.0–0.4)
Eos: 2 %
Hematocrit: 36 % (ref 34.0–46.6)
Hemoglobin: 12 g/dL (ref 11.1–15.9)
Immature Grans (Abs): 0 10*3/uL (ref 0.0–0.1)
Immature Granulocytes: 0 %
Lymphocytes Absolute: 1.1 10*3/uL (ref 0.7–3.1)
Lymphs: 27 %
MCH: 28.9 pg (ref 26.6–33.0)
MCHC: 33.3 g/dL (ref 31.5–35.7)
MCV: 87 fL (ref 79–97)
Monocytes Absolute: 0.5 10*3/uL (ref 0.1–0.9)
Monocytes: 11 %
Neutrophils Absolute: 2.5 10*3/uL (ref 1.4–7.0)
Neutrophils: 59 %
Platelets: 246 10*3/uL (ref 150–450)
RBC: 4.15 x10E6/uL (ref 3.77–5.28)
RDW: 12.7 % (ref 11.7–15.4)
WBC: 4.2 10*3/uL (ref 3.4–10.8)

## 2019-01-17 LAB — COMPREHENSIVE METABOLIC PANEL
ALT: 25 IU/L (ref 0–32)
AST: 25 IU/L (ref 0–40)
Albumin/Globulin Ratio: 1.8 (ref 1.2–2.2)
Albumin: 4.6 g/dL (ref 3.7–4.7)
Alkaline Phosphatase: 80 IU/L (ref 39–117)
BUN/Creatinine Ratio: 19 (ref 12–28)
BUN: 15 mg/dL (ref 8–27)
Bilirubin Total: 0.3 mg/dL (ref 0.0–1.2)
CO2: 28 mmol/L (ref 20–29)
Calcium: 9.8 mg/dL (ref 8.7–10.3)
Chloride: 99 mmol/L (ref 96–106)
Creatinine, Ser: 0.79 mg/dL (ref 0.57–1.00)
GFR calc Af Amer: 85 mL/min/{1.73_m2} (ref >59)
GFR calc non Af Amer: 74 mL/min/{1.73_m2} (ref >59)
Globulin, Total: 2.5 g/dL (ref 1.5–4.5)
Glucose: 94 mg/dL (ref 65–99)
Potassium: 4.2 mmol/L (ref 3.5–5.2)
Sodium: 139 mmol/L (ref 134–144)
Total Protein: 7.1 g/dL (ref 6.0–8.5)

## 2019-01-17 LAB — TSH+FREE T4
Free T4: 1.09 ng/dL (ref 0.82–1.77)
TSH: 1.3 u[IU]/mL (ref 0.450–4.500)

## 2019-01-17 LAB — LIPID PANEL
Chol/HDL Ratio: 3.2 ratio (ref 0.0–4.4)
Cholesterol, Total: 158 mg/dL (ref 100–199)
HDL: 50 mg/dL (ref >39)
LDL Chol Calc (NIH): 87 mg/dL (ref 0–99)
Triglycerides: 114 mg/dL (ref 0–149)
VLDL Cholesterol Cal: 21 mg/dL (ref 5–40)

## 2019-01-30 DIAGNOSIS — R202 Paresthesia of skin: Secondary | ICD-10-CM | POA: Diagnosis not present

## 2019-01-30 DIAGNOSIS — E519 Thiamine deficiency, unspecified: Secondary | ICD-10-CM | POA: Diagnosis not present

## 2019-01-30 DIAGNOSIS — R200 Anesthesia of skin: Secondary | ICD-10-CM | POA: Diagnosis not present

## 2019-01-30 DIAGNOSIS — E559 Vitamin D deficiency, unspecified: Secondary | ICD-10-CM | POA: Diagnosis not present

## 2019-01-30 DIAGNOSIS — E538 Deficiency of other specified B group vitamins: Secondary | ICD-10-CM | POA: Diagnosis not present

## 2019-01-30 DIAGNOSIS — G250 Essential tremor: Secondary | ICD-10-CM | POA: Diagnosis not present

## 2019-02-11 DIAGNOSIS — H5203 Hypermetropia, bilateral: Secondary | ICD-10-CM | POA: Diagnosis not present

## 2019-02-11 DIAGNOSIS — H2513 Age-related nuclear cataract, bilateral: Secondary | ICD-10-CM | POA: Diagnosis not present

## 2019-03-02 DIAGNOSIS — D2271 Melanocytic nevi of right lower limb, including hip: Secondary | ICD-10-CM | POA: Diagnosis not present

## 2019-03-02 DIAGNOSIS — D485 Neoplasm of uncertain behavior of skin: Secondary | ICD-10-CM | POA: Diagnosis not present

## 2019-03-02 DIAGNOSIS — D2262 Melanocytic nevi of left upper limb, including shoulder: Secondary | ICD-10-CM | POA: Diagnosis not present

## 2019-03-02 DIAGNOSIS — L821 Other seborrheic keratosis: Secondary | ICD-10-CM | POA: Diagnosis not present

## 2019-03-02 DIAGNOSIS — L820 Inflamed seborrheic keratosis: Secondary | ICD-10-CM | POA: Diagnosis not present

## 2019-03-02 DIAGNOSIS — D225 Melanocytic nevi of trunk: Secondary | ICD-10-CM | POA: Diagnosis not present

## 2019-03-02 DIAGNOSIS — B351 Tinea unguium: Secondary | ICD-10-CM | POA: Diagnosis not present

## 2019-03-02 DIAGNOSIS — D2272 Melanocytic nevi of left lower limb, including hip: Secondary | ICD-10-CM | POA: Diagnosis not present

## 2019-03-02 DIAGNOSIS — D2261 Melanocytic nevi of right upper limb, including shoulder: Secondary | ICD-10-CM | POA: Diagnosis not present

## 2019-03-11 DIAGNOSIS — R202 Paresthesia of skin: Secondary | ICD-10-CM | POA: Diagnosis not present

## 2019-03-11 DIAGNOSIS — R200 Anesthesia of skin: Secondary | ICD-10-CM | POA: Diagnosis not present

## 2019-04-03 DIAGNOSIS — R69 Illness, unspecified: Secondary | ICD-10-CM | POA: Diagnosis not present

## 2019-04-03 DIAGNOSIS — C7A090 Malignant carcinoid tumor of the bronchus and lung: Secondary | ICD-10-CM | POA: Diagnosis not present

## 2019-04-29 ENCOUNTER — Telehealth: Payer: Self-pay

## 2019-04-29 NOTE — Telephone Encounter (Signed)
Pt said she was told by you at her last visit that if Zoloft 25 mg daily is not helping, to call and you will prescribe an anxiety medication to help.  Please advise.

## 2019-04-29 NOTE — Telephone Encounter (Signed)
Last visit was 4 months ago - there is nothing in the note to indicate which medication I might have had in mind.  She may need to come in to discuss.

## 2019-04-30 DIAGNOSIS — C7A090 Malignant carcinoid tumor of the bronchus and lung: Secondary | ICD-10-CM | POA: Diagnosis not present

## 2019-04-30 NOTE — Telephone Encounter (Signed)
Patient informed. Scheduled for Tuesday at 340.   CM

## 2019-05-05 ENCOUNTER — Ambulatory Visit (INDEPENDENT_AMBULATORY_CARE_PROVIDER_SITE_OTHER): Payer: Medicare HMO | Admitting: Internal Medicine

## 2019-05-05 ENCOUNTER — Other Ambulatory Visit: Payer: Self-pay

## 2019-05-05 ENCOUNTER — Encounter: Payer: Self-pay | Admitting: Internal Medicine

## 2019-05-05 VITALS — BP 138/62 | HR 65 | Temp 98.5°F | Ht 64.0 in | Wt 152.0 lb

## 2019-05-05 DIAGNOSIS — I10 Essential (primary) hypertension: Secondary | ICD-10-CM | POA: Diagnosis not present

## 2019-05-05 DIAGNOSIS — C7A090 Malignant carcinoid tumor of the bronchus and lung: Secondary | ICD-10-CM

## 2019-05-05 DIAGNOSIS — R69 Illness, unspecified: Secondary | ICD-10-CM | POA: Diagnosis not present

## 2019-05-05 DIAGNOSIS — F411 Generalized anxiety disorder: Secondary | ICD-10-CM | POA: Diagnosis not present

## 2019-05-05 MED ORDER — BUPROPION HCL ER (XL) 150 MG PO TB24: 150.0000 mg | ORAL_TABLET | Freq: Every day | ORAL | 0 refills | Status: DC

## 2019-05-05 MED ORDER — SERTRALINE HCL 50 MG PO TABS: 50.0000 mg | ORAL_TABLET | Freq: Every day | ORAL | 0 refills | Status: DC

## 2019-05-05 NOTE — Progress Notes (Signed)
Date:  05/05/2019   Name:  Alexandra Watson   DOB:  1945/02/28   MRN:  [id number]   Chief Complaint: Anxiety (taking sertraline wants to know if she can change dosage or take something else to help anxiety. been getting worse.)  Anxiety Presents for follow-up visit. Symptoms include excessive worry, nervous/anxious behavior and obsessions. Patient reports no chest pain, decreased concentration, dizziness, irritability, nausea, shortness of breath or suicidal ideas. Symptoms occur most days. The severity of symptoms is moderate. The quality of sleep is good.   Compliance with medications is 76-100% (zoloft 25 mg daily).  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   She decided to seek a second opinion and since then is feeling more hopeful about alternative treatments.  In addition, the liver lesion seems to have been present for some years and is therefore not as concerning. She increased her zoloft to 50 mg about 5 days ago and today is feeling somewhat less anxious.  Lab Results  Component Value Date   CREATININE 0.79 01/12/2019   BUN 15 01/12/2019   NA 139 01/12/2019   K 4.2 01/12/2019   CL 99 01/12/2019   CO2 28 01/12/2019   Lab Results  Component Value Date   CHOL 158 01/12/2019   HDL 50 01/12/2019   LDLCALC 87 01/12/2019   TRIG 114 01/12/2019   CHOLHDL 3.2 01/12/2019   Lab Results  Component Value Date   TSH 1.300 01/12/2019   No results found for: HGBA1C   Review of Systems  Constitutional: Negative for chills, fatigue, fever, irritability and unexpected weight change.  Respiratory: Negative for chest tightness and shortness of breath.   Cardiovascular: Negative for chest pain and leg swelling.  Gastrointestinal: Negative for abdominal pain, diarrhea and nausea.  Musculoskeletal: Positive for arthralgias (left knee limits exercise).  Neurological: Positive for tremors. Negative for dizziness and  headaches.  Psychiatric/Behavioral: Positive for sleep disturbance. Negative for decreased concentration and suicidal ideas. The patient is nervous/anxious.     Patient Active Problem List   Diagnosis Date Noted  . Anaphylaxis due to food, initial encounter 10/08/2018  . Primary osteoarthritis of left knee 09/17/2018  . Anaphylactic syndrome 08/28/2018  . Generalized anxiety disorder 11/02/2017  . Weight loss, abnormal 10/11/2017  . Neuropathy 07/06/2016  . SUI (stress urinary incontinence, female) 11/11/2015  . Incomplete emptying of bladder 11/11/2015  . Recurrent UTI 11/10/2015  . Arthritis of knee, left 07/01/2015  . Insomnia 06/30/2015  . Atrophic vaginitis 06/30/2015  . Gastroesophageal reflux disease 06/30/2015  . Osteopenia 05/20/2015  . Anemia 09/14/2012  . Constipation 08/27/2012  . Essential hypertension 10/04/2011  . Hyperlipidemia, mixed 10/04/2011  . History of malignant carcinoid tumor of bronchus 10/04/2011    Allergies  Allergen Reactions  . Galactose Anaphylaxis, Anxiety, Diarrhea, Itching and Shortness Of Breath  . Iodinated Diagnostic Agents Hives  . Oxycodone Nausea And Vomiting and Nausea Only    Past Surgical History:  Procedure Laterality Date  . ANKLE ARTHROSCOPY WITH OPEN REDUCTION INTERNAL FIXATION (ORIF)  07/2015   tri-malleolar  . BUNIONECTOMY Bilateral 2001, 2005  . CHOLECYSTECTOMY    . COLONOSCOPY  05/2010   normal  . GANGLION CYST EXCISION Left   . THORACOTOMY / DECORTICATION PARIETAL PLEURA Left 06/2017   [facility]  . 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 Smoking  status: Former Smoker    Packs/day: 0.25    Years: 20.00    Pack years: 5.00    Types: Cigarettes    Quit date: 1985    Years since quitting: 36.2  . Smokeless tobacco: Never Used  . Tobacco comment: smoking cessation materials not required  Substance Use Topics  . Alcohol use: Yes    Alcohol/week: 1.0 standard  drinks    Types: 1 Glasses of wine per week  . Drug use: No     Medication list has been reviewed and updated.  Current Meds  Medication Sig  . Calcium Carbonate-Vit D-Min (CALCIUM 1200 PO) Take 2,400 mg by mouth.  . EPINEPHrine 0.3 mg/0.3 mL IJ SOAJ injection INJECT 0.3 MLS (0.3 MG TOTAL) INTO THE MUSCLE AS NEEDED (ANAPHYLAXIS). FOR UP TO 1 DOSE  . gabapentin (NEURONTIN) 300 MG capsule Take 1 capsule (300 mg total) by mouth daily. (Patient taking differently: Take 300 mg by mouth in the morning and at bedtime. )  . hydrochlorothiazide (HYDRODIURIL) 25 MG tablet Take 1 tablet (25 mg total) by mouth daily.  . Melatonin 10 MG TABS Take by mouth at bedtime.  . metoprolol tartrate (LOPRESSOR) 50 MG tablet Take 0.5 tablets (25 mg total) by mouth daily.  . Omega-3 Fatty Acids (FISH OIL) 1000 MG CAPS Take by mouth.  . psyllium (METAMUCIL) 58.6 % powder Take 1 packet by mouth as needed.  . sertraline (ZOLOFT) 50 MG tablet Take 1 tablet (50 mg total) by mouth daily.  . simvastatin (ZOCOR) 20 MG tablet Take 1 tablet (20 mg total) by mouth daily.  Marland Kitchen zinc gluconate 50 MG tablet Take 50 mg by mouth daily.  . [DISCONTINUED] sertraline (ZOLOFT) 25 MG tablet Take 1 tablet (25 mg total) by mouth daily.    PHQ 2/9 Scores 05/05/2019 01/12/2019 12/03/2018 08/28/2018  PHQ - 2 Score 2 0 0 0  PHQ- 9 Score 4 0 0 1   GAD 7 : Generalized Anxiety Score 05/05/2019 10/11/2017  Nervous, Anxious, on Edge 2 3  Control/stop worrying 3 3  Worry too much - different things 3 3  Trouble relaxing 0 3  Restless 0 3  Easily annoyed or irritable 0 0  Afraid - awful might happen 0 3  Total GAD 7 Score 8 18  Anxiety Difficulty Not difficult at all Extremely difficult      BP Readings from Last 3 Encounters:  05/05/19 138/62  01/12/19 136/78  12/03/18 132/74    Physical Exam Vitals and nursing note reviewed.  Constitutional:      General: She is not in acute distress.    Appearance: She is well-developed.  HENT:       Head: Normocephalic and atraumatic.  Cardiovascular:     Rate and Rhythm: Normal rate and regular rhythm.     Pulses: Normal pulses.     Heart sounds: No murmur.  Pulmonary:     Effort: Pulmonary effort is normal. No respiratory distress.     Breath sounds: No wheezing or rhonchi.  Musculoskeletal:     Cervical back: Normal range of motion.     Right lower leg: No edema.     Left lower leg: No edema.  Lymphadenopathy:     Cervical: No cervical adenopathy.  Skin:    General: Skin is warm and dry.     Findings: No rash.  Neurological:     Mental Status: She is alert and oriented to person, place, and time.  Psychiatric:  Attention and Perception: Attention normal.        Mood and Affect: Mood is anxious.        Speech: Speech normal.        Cognition and Memory: Cognition normal.        Judgment: Judgment normal.     Wt Readings from Last 3 Encounters:  05/05/19 152 lb (68.9 kg)  01/12/19 150 lb 12.8 oz (68.4 kg)  12/03/18 151 lb (68.5 kg)    BP 138/62   Pulse 65   Temp 98.5 F (36.9 C) (Oral)   Ht 5\' 4"  (1.626 m)   Wt 152 lb (68.9 kg)   SpO2 98%   BMI 26.09 kg/m   Assessment and Plan: 1. Generalized anxiety disorder Continue sertraline 50 mg per day Add Bupropion 150 mg per day Follow up in 6 weeks; sooner if needed - sertraline (ZOLOFT) 50 MG tablet; Take 1 tablet (50 mg total) by mouth daily.  Dispense: 90 tablet; Refill: 0 - buPROPion (WELLBUTRIN XL) 150 MG 24 hr tablet; Take 1 tablet (150 mg total) by mouth daily.  Dispense: 90 tablet; Refill: 0  2. Essential hypertension Clinically stable exam with well controlled BP. Tolerating medications without side effects at this time. Pt to continue current regimen and low sodium diet; benefits of regular exercise as able discussed.  3. 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 Any errors are unintentional.  Halina Maidens, MD [facility]  05/05/2019

## 2019-05-06 DIAGNOSIS — Z0100 Encounter for examination of eyes and vision without abnormal findings: Secondary | ICD-10-CM | POA: Diagnosis not present

## 2019-06-06 ENCOUNTER — Encounter: Payer: Self-pay | Admitting: Internal Medicine

## 2019-06-09 DIAGNOSIS — M25562 Pain in left knee: Secondary | ICD-10-CM | POA: Diagnosis not present

## 2019-06-09 DIAGNOSIS — G8929 Other chronic pain: Secondary | ICD-10-CM | POA: Diagnosis not present

## 2019-06-09 DIAGNOSIS — M1712 Unilateral primary osteoarthritis, left knee: Secondary | ICD-10-CM | POA: Diagnosis not present

## 2019-06-17 ENCOUNTER — Ambulatory Visit (INDEPENDENT_AMBULATORY_CARE_PROVIDER_SITE_OTHER): Payer: Medicare HMO | Admitting: Internal Medicine

## 2019-06-17 ENCOUNTER — Other Ambulatory Visit: Payer: Self-pay

## 2019-06-17 ENCOUNTER — Encounter: Payer: Self-pay | Admitting: Internal Medicine

## 2019-06-17 VITALS — BP 114/58 | HR 77 | Temp 97.7°F | Ht 64.0 in | Wt 147.0 lb

## 2019-06-17 DIAGNOSIS — E782 Mixed hyperlipidemia: Secondary | ICD-10-CM

## 2019-06-17 DIAGNOSIS — Z91018 Allergy to other foods: Secondary | ICD-10-CM | POA: Diagnosis not present

## 2019-06-17 DIAGNOSIS — C7A090 Malignant carcinoid tumor of the bronchus and lung: Secondary | ICD-10-CM

## 2019-06-17 DIAGNOSIS — F411 Generalized anxiety disorder: Secondary | ICD-10-CM | POA: Diagnosis not present

## 2019-06-17 DIAGNOSIS — I10 Essential (primary) hypertension: Secondary | ICD-10-CM

## 2019-06-17 DIAGNOSIS — Z1211 Encounter for screening for malignant neoplasm of colon: Secondary | ICD-10-CM | POA: Diagnosis not present

## 2019-06-17 DIAGNOSIS — R69 Illness, unspecified: Secondary | ICD-10-CM | POA: Diagnosis not present

## 2019-06-17 MED ORDER — SIMVASTATIN 20 MG PO TABS: 20.0000 mg | ORAL_TABLET | Freq: Every day | ORAL | 1 refills | Status: DC

## 2019-06-17 MED ORDER — METOPROLOL TARTRATE 25 MG PO TABS: 25.0000 mg | ORAL_TABLET | Freq: Every day | ORAL | 1 refills | Status: DC

## 2019-06-17 MED ORDER — HYDROCHLOROTHIAZIDE 25 MG PO TABS: 25.0000 mg | ORAL_TABLET | Freq: Every day | ORAL | 1 refills | Status: DC

## 2019-06-17 NOTE — Patient Instructions (Signed)
Take a whole Bupropion daily.

## 2019-06-17 NOTE — Progress Notes (Signed)
Date:  06/17/2019   Name:  Alexandra Watson   DOB:  1945-03-21   MRN:  [id number]   Chief Complaint: Anxiety (6 week follow up. ) and Depression  Anxiety Presents for follow-up visit. Symptoms include excessive worry and nervous/anxious behavior. Patient reports no chest pain, confusion, depressed mood, dizziness, feeling of choking, palpitations, shortness of breath or suicidal ideas. Symptoms occur most days. The quality of sleep is fair (on trazodone).   Compliance with medications is 76-100% (started on bupropion last visit 150 mg but only taking 75 mg).  Depression        This is a chronic problem.The problem is unchanged.  Associated symptoms include no appetite change, no headaches and no suicidal ideas.     The symptoms are aggravated by social issues (stress and anxiety over cancer diagnosis).  Past treatments include SSRIs - Selective serotonin reuptake inhibitors (stopped Zoloft after last visit due to comments from Oncologist).  Past medical history includes anxiety.   Hypertension This is a chronic problem. The problem is controlled. Associated symptoms include anxiety. Pertinent negatives include no chest pain, headaches, palpitations or shortness of breath. Past treatments include diuretics and beta blockers. The current treatment provides significant improvement. There are no compliance problems.  There is no history of kidney disease, CAD/MI, CVA or PVD.  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  She is still anxious about this but feels well with no pulmonary symptoms.  Lab Results  Component Value Date   CREATININE 0.79 01/12/2019   BUN 15 01/12/2019   NA 139 01/12/2019   K 4.2 01/12/2019   CL 99 01/12/2019   CO2 28 01/12/2019   Lab Results  Component Value Date   CHOL 158 01/12/2019   HDL 50 01/12/2019   LDLCALC 87 01/12/2019   TRIG 114 01/12/2019   CHOLHDL 3.2 01/12/2019    Lab Results  Component Value Date   TSH 1.300 01/12/2019   No results found for: HGBA1C Lab Results  Component Value Date   WBC 4.2 01/12/2019   HGB 12.0 01/12/2019   HCT 36.0 01/12/2019   MCV 87 01/12/2019   PLT 246 01/12/2019   Lab Results  Component Value Date   ALT 25 01/12/2019   AST 25 01/12/2019   ALKPHOS 80 01/12/2019   BILITOT 0.3 01/12/2019     Review of Systems  Constitutional: Negative for appetite change, diaphoresis, fever and unexpected weight change.  Respiratory: Negative for cough, chest tightness, shortness of breath and wheezing.   Cardiovascular: Negative for chest pain, palpitations and leg swelling.  Gastrointestinal: Negative for abdominal pain.  Neurological: Negative for dizziness, light-headedness and headaches.  Psychiatric/Behavioral: Positive for depression and sleep disturbance. Negative for confusion, dysphoric mood and suicidal ideas. The patient is nervous/anxious.     Patient Active Problem List   Diagnosis Date Noted  . Atypical carcinoid lung tumor ([place] [address]) 05/05/2019  . Anaphylaxis due to food, initial encounter 10/08/2018  . Primary osteoarthritis of left knee 09/17/2018  . Anaphylactic syndrome 08/28/2018  . Generalized anxiety disorder 11/02/2017  . Weight loss, abnormal 10/11/2017  . Neuropathy 07/06/2016  . SUI (stress urinary incontinence, female) 11/11/2015  . Incomplete emptying of bladder 11/11/2015  . Recurrent UTI 11/10/2015  . Arthritis of knee, left 07/01/2015  . Insomnia 06/30/2015  . Atrophic vaginitis 06/30/2015  . Gastroesophageal reflux disease 06/30/2015  . Osteopenia 05/20/2015  . Anemia 09/14/2012  .  Constipation 08/27/2012  . Essential hypertension 10/04/2011  . Hyperlipidemia, mixed 10/04/2011  . History of malignant carcinoid tumor of bronchus 10/04/2011    Allergies  Allergen Reactions  . Galactose Anaphylaxis, Anxiety, Diarrhea, Itching and Shortness Of Breath  . Iodinated Diagnostic Agents  Hives  . Oxycodone Nausea And Vomiting and Nausea Only    Past Surgical History:  Procedure Laterality Date  . ANKLE ARTHROSCOPY WITH OPEN REDUCTION INTERNAL FIXATION (ORIF)  07/2015   tri-malleolar  . BUNIONECTOMY Bilateral 2001, 2005  . CHOLECYSTECTOMY    . COLONOSCOPY  05/2010   normal  . GANGLION CYST EXCISION Left   . THORACOTOMY / DECORTICATION PARIETAL PLEURA Left 06/2017   [facility]  . 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 Smoking status: Former Smoker    Packs/day: 0.25    Years: 20.00    Pack years: 5.00    Types: Cigarettes    Quit date: 1985    Years since quitting: 36.3  . Smokeless tobacco: Never Used  . Tobacco comment: smoking cessation materials not required  Substance Use Topics  . Alcohol use: Yes    Alcohol/week: 1.0 standard drinks    Types: 1 Glasses of wine per week  . Drug use: No     Medication list has been reviewed and updated.  Current Meds  Medication Sig  . buPROPion (WELLBUTRIN XL) 150 MG 24 hr tablet Take 1 tablet (150 mg total) by mouth daily. (Patient taking differently: Take 75 mg by mouth daily. )  . Calcium Carbonate-Vit D-Min (CALCIUM 1200 PO) Take 2,400 mg by mouth.  . CHOLECALCIFEROL PO Take 1 tablet by mouth daily.  Marland Kitchen EPINEPHrine 0.3 mg/0.3 mL IJ SOAJ injection INJECT 0.3 MLS (0.3 MG TOTAL) INTO THE MUSCLE AS NEEDED (ANAPHYLAXIS). FOR UP TO 1 DOSE  . gabapentin (NEURONTIN) 300 MG capsule Take 1 capsule (300 mg total) by mouth daily. (Patient taking differently: Take 300 mg by mouth in the morning and at bedtime. )  . hydrochlorothiazide (HYDRODIURIL) 25 MG tablet Take 1 tablet (25 mg total) by mouth daily.  . Melatonin 10 MG TABS Take by mouth at bedtime.  . metoprolol tartrate (LOPRESSOR) 25 MG tablet Take 25 mg by mouth daily.  . Omega-3 Fatty Acids (FISH OIL) 1000 MG CAPS Take by mouth.  . polyethylene glycol (MIRALAX / GLYCOLAX) packet Take 17 g by mouth daily.  .  psyllium (METAMUCIL) 58.6 % powder Take 1 packet by mouth as needed.  . simvastatin (ZOCOR) 20 MG tablet Take 1 tablet (20 mg total) by mouth daily.  Marland Kitchen zinc gluconate 50 MG tablet Take 50 mg by mouth daily.    PHQ 2/9 Scores 06/17/2019 05/05/2019 01/12/2019 12/03/2018  PHQ - 2 Score 4 2 0 0  PHQ- 9 Score 6 4 0 0   GAD 7 : Generalized Anxiety Score 06/17/2019 05/05/2019 10/11/2017  Nervous, Anxious, on Edge 2 2 3   Control/stop worrying 2 3 3   Worry too much - different things 2 3 3   Trouble relaxing 1 0 3  Restless 0 0 3  Easily annoyed or irritable 0 0 0  Afraid - awful might happen 0 0 3  Total GAD 7 Score 7 8 18   Anxiety Difficulty Not difficult at all Not difficult at all Extremely difficult      BP Readings from Last 3 Encounters:  06/17/19 (!) 114/58  05/05/19 138/62  01/12/19 136/78    Physical  Exam Vitals and nursing note reviewed.  Constitutional:      General: She is not in acute distress.    Appearance: Normal appearance. She is well-developed.  HENT:     Head: Normocephalic and atraumatic.  Neck:     Vascular: No carotid bruit.  Cardiovascular:     Rate and Rhythm: Normal rate and regular rhythm.     Pulses: Normal pulses.  Pulmonary:     Effort: Pulmonary effort is normal. No respiratory distress.     Breath sounds: No wheezing or rhonchi.  Musculoskeletal:     Cervical back: Normal range of motion.     Right lower leg: No edema.     Left lower leg: No edema.  Lymphadenopathy:     Cervical: No cervical adenopathy.  Skin:    General: Skin is warm and dry.     Capillary Refill: Capillary refill takes less than 2 seconds.     Findings: No rash.  Neurological:     Mental Status: She is alert and oriented to person, place, and time.  Psychiatric:        Mood and Affect: Mood normal.        Behavior: Behavior normal.        Thought Content: Thought content normal.     Wt Readings from Last 3 Encounters:  06/17/19 147 lb (66.7 kg)  05/05/19 152 lb (68.9  kg)  01/12/19 150 lb 12.8 oz (68.4 kg)    BP (!) 114/58   Pulse 77   Temp 97.7 F (36.5 C) (Temporal)   Ht 5\' 4"  (1.626 m)   Wt 147 lb (66.7 kg)   SpO2 97%   BMI 25.23 kg/m   Assessment and Plan: 1. Generalized anxiety disorder Improved on low dose bupropion Minimal depressive sx now off of Zoloft Increase bupropion to 150 mg daily  2. Essential hypertension Clinically stable exam with well controlled BP. Tolerating medications without side effects at this time. Pt to continue current regimen and low sodium diet; benefits of regular exercise as able discussed. - hydrochlorothiazide (HYDRODIURIL) 25 MG tablet; Take 1 tablet (25 mg total) by mouth daily.  Dispense: 90 tablet; Refill: 1 - metoprolol tartrate (LOPRESSOR) 25 MG tablet; Take 1 tablet (25 mg total) by mouth daily.  Dispense: 90 tablet; Refill: 1  3. Hyperlipidemia, mixed Tolerating statin medication without side effects at this time Continue same therapy without change at this time. - simvastatin (ZOCOR) 20 MG tablet; Take 1 tablet (20 mg total) by mouth daily.  Dispense: 90 tablet; Refill: 1  4. Colon cancer screening - Fecal occult blood, imunochemical  5. Allergy to alpha-gal Will recheck levels at visit in 4 months - for a one year follow up  6. 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 Berta Minor at [facility] dictated using [employer]. Any errors are unintentional.  Halina Maidens, MD [facility]  06/17/2019

## 2019-06-25 DIAGNOSIS — Z1211 Encounter for screening for malignant neoplasm of colon: Secondary | ICD-10-CM | POA: Diagnosis not present

## 2019-06-26 ENCOUNTER — Other Ambulatory Visit: Payer: Self-pay | Admitting: Internal Medicine

## 2019-06-26 DIAGNOSIS — F411 Generalized anxiety disorder: Secondary | ICD-10-CM

## 2019-06-26 NOTE — Telephone Encounter (Signed)
Requested medication (s) are due for refill today: Yes  Requested medication (s) are on the active medication list: Yes  Last refill:  05/05/19  Future visit scheduled: Yes  Notes to clinic:  Pharmacy asking for diagnosis code.    Requested Prescriptions  Pending Prescriptions Disp Refills   buPROPion (WELLBUTRIN XL) 150 MG 24 hr tablet [Pharmacy Med Name: BUPROPION HCL XL 150 MG TABLET] 90 tablet 1    Sig: TAKE 1 TABLET BY MOUTH EVERY DAY      Psychiatry: Antidepressants - bupropion Passed - 06/26/2019 12:32 PM      Passed - Last BP in normal range    BP Readings from Last 1 Encounters:  06/17/19 (!) 114/58          Passed - Valid encounter within last 6 months    Recent Outpatient Visits           1 week ago Generalized anxiety disorder   [place], [place]   1 month ago Generalized anxiety disorder   [facility] Glean Hess, MD   5 months ago Annual physical exam   [facility] Glean Hess, MD   6 months ago Change in voice   [facility] Glean Hess, MD   10 months ago Anaphylaxis, subsequent encounter   [facility] Glean Hess, MD       Future Appointments             In 3 months [facility] Melia Jesse Sans, MD [facility], [facility]

## 2019-06-27 LAB — SPECIMEN STATUS REPORT

## 2019-06-27 LAB — FECAL OCCULT BLOOD, IMMUNOCHEMICAL: Fecal Occult Bld: NEGATIVE

## 2019-07-02 DIAGNOSIS — I700 Atherosclerosis of aorta: Secondary | ICD-10-CM | POA: Diagnosis not present

## 2019-07-02 DIAGNOSIS — C782 Secondary malignant neoplasm of pleura: Secondary | ICD-10-CM | POA: Diagnosis not present

## 2019-07-02 DIAGNOSIS — C7A090 Malignant carcinoid tumor of the bronchus and lung: Secondary | ICD-10-CM | POA: Diagnosis not present

## 2019-07-02 DIAGNOSIS — R160 Hepatomegaly, not elsewhere classified: Secondary | ICD-10-CM | POA: Diagnosis not present

## 2019-07-02 DIAGNOSIS — Z902 Acquired absence of lung [part of]: Secondary | ICD-10-CM | POA: Diagnosis not present

## 2019-07-02 DIAGNOSIS — R69 Illness, unspecified: Secondary | ICD-10-CM | POA: Diagnosis not present

## 2019-07-02 DIAGNOSIS — Z9049 Acquired absence of other specified parts of digestive tract: Secondary | ICD-10-CM | POA: Diagnosis not present

## 2019-07-02 DIAGNOSIS — C787 Secondary malignant neoplasm of liver and intrahepatic bile duct: Secondary | ICD-10-CM | POA: Diagnosis not present

## 2019-07-02 DIAGNOSIS — K769 Liver disease, unspecified: Secondary | ICD-10-CM | POA: Diagnosis not present

## 2019-07-02 DIAGNOSIS — R05 Cough: Secondary | ICD-10-CM | POA: Diagnosis not present

## 2019-07-07 DIAGNOSIS — R69 Illness, unspecified: Secondary | ICD-10-CM | POA: Diagnosis not present

## 2019-07-13 ENCOUNTER — Ambulatory Visit: Payer: Medicare HMO | Admitting: Internal Medicine

## 2019-07-15 ENCOUNTER — Ambulatory Visit (INDEPENDENT_AMBULATORY_CARE_PROVIDER_SITE_OTHER): Payer: Medicare HMO

## 2019-07-15 DIAGNOSIS — Z0000 Encounter for general adult medical examination without abnormal findings: Secondary | ICD-10-CM | POA: Diagnosis not present

## 2019-07-15 NOTE — Progress Notes (Signed)
Subjective:   Alexandra Watson is a 74 y.o. female who presents for Medicare Annual (Subsequent) preventive examination.  Virtual Visit via Telephone Note  I connected with  Alexandra Watson on 07/15/19 at 10:40 AM EDT by telephone and verified that I am speaking with the correct person using two identifiers.  Medicare Annual Wellness visit completed telephonically due to Covid-19 pandemic.   Location: Patient: home Provider: office   I discussed the limitations, risks, security and privacy concerns of performing an evaluation and management service by telephone and the availability of in person appointments. The patient expressed understanding and agreed to proceed.  Unable to perform video visit due to patient does not have video capability.   Some vital signs may be absent or patient reported.   Alexandra Marker, LPN    Review of Systems:   Cardiac Risk Factors include: advanced age (>63men, >52 women);dyslipidemia;hypertension     Objective:     Vitals: There were no vitals taken for this visit.  There is no height or weight on file to calculate BMI.  Advanced Directives 07/15/2019 07/14/2018 07/10/2017 07/06/2016 08/07/2015  Does Patient Have a Medical Advance Directive? Yes Yes Yes Yes Yes  Type of Paramedic of [place];Living will Living will;Healthcare Power of [place];Living will Living will [place]  Does patient want to make changes to medical advance directive? Yes (MAU/Ambulatory/Procedural Areas - Information given) - - - -  Copy of [place] in Chart? - No - copy requested No - copy requested - -    Tobacco Social History   Tobacco Use  Smoking Status Former Smoker  . Packs/day: 0.25  . Years: 20.00  . Pack years: 5.00  . Types: Cigarettes  . Quit date: 57  . Years since quitting: 36.4  Smokeless Tobacco Never Used  Tobacco Comment   smoking cessation  materials not required     Counseling given: Not Answered Comment: smoking cessation materials not required   Clinical Intake:  Pre-visit preparation completed: Yes  Pain : No/denies pain     Nutritional Risks: None Diabetes: No  How often do you need to have someone help you when you read instructions, pamphlets, or other written materials from your doctor or pharmacy?: 1 - Never  Interpreter Needed?: No  Information entered by :: Alexandra Marker LPN  Past Medical History:  Diagnosis Date  . Anxiety   . Depression   . Fracture of ankle, trimalleolar, left, closed 08/08/2015  . GERD (gastroesophageal reflux disease)   . Hyperlipidemia   . Hypertension   . 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 ANKLE ARTHROSCOPY WITH OPEN REDUCTION INTERNAL FIXATION (ORIF)  07/2015   tri-malleolar  . BUNIONECTOMY Bilateral 2001, 2005  . CHOLECYSTECTOMY    . COLONOSCOPY  05/2010   normal  . GANGLION CYST EXCISION Left   . THORACOTOMY / DECORTICATION PARIETAL PLEURA Left 06/2017   [facility]  . 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 CAD Brother   . Heart attack Father   . Stroke Mother   . Heart attack Brother   . Breast cancer Neg Hx    Social History   Socioeconomic History  . Marital status: Married    Spouse name: Not on file  . Number of children: 1  .  Years of education: some college  . Highest education level: 12th grade  Occupational History  . Occupation: Retired  Tobacco Use  . Smoking status: Former Smoker    Packs/day: 0.25    Years: 20.00    Pack years: 5.00    Types: Cigarettes    Quit date: 1985    Years since quitting: 36.4  . Smokeless tobacco: Never Used  . Tobacco comment: smoking cessation materials not required  Substance and Sexual Activity  . Alcohol use: Yes    Alcohol/week: 1.0 standard drinks    Types: 1  Glasses of wine per week  . Drug use: No  . Sexual activity: Not Currently  Other Topics Concern  . Not on file  Social History Narrative  . Not on file   Social Determinants of Health   Financial Resource Strain: Low Risk   . Difficulty of Paying Living Expenses: Not hard at all  Food Insecurity: No Food Insecurity  . Worried About Charity fundraiser in the Last Year: Never true  . Ran Out of Food in the Last Year: Never true  Transportation Needs: No Transportation Needs  . Lack of Transportation (Medical): No  . Lack of Transportation (Non-Medical): No  Physical Activity: Inactive  . Days of Exercise per Week: 0 days  . Minutes of Exercise per Session: 0 min  Stress: Stress Concern Present  . Feeling of Stress : Rather much  Social Connections: Slightly Isolated  . Frequency of Communication with Friends and Family: More than three times a week  . Frequency of Social Gatherings with Friends and Family: Three times a week  . Attends Religious Services: More than 4 times per year  . Active Member of Clubs or Organizations: No  . Attends Archivist Meetings: Never  . Marital Status: Married    Outpatient Encounter Medications as of 07/15/2019  Medication Sig  . Ascorbic Acid (VITAMIN C WITH ROSE HIPS) 500 MG tablet Take 500 mg by mouth daily.  Marland Kitchen buPROPion (WELLBUTRIN XL) 150 MG 24 hr tablet TAKE 1 TABLET BY MOUTH EVERY DAY  . Calcium Carbonate-Vit D-Min (CALCIUM 1200 PO) Take 2,400 mg by mouth.  . CHOLECALCIFEROL PO Take 1 tablet by mouth daily. 5000 iu  . gabapentin (NEURONTIN) 300 MG capsule Take 1 capsule (300 mg total) by mouth daily. (Patient taking differently: Take 300 mg by mouth at bedtime. )  . hydrochlorothiazide (HYDRODIURIL) 25 MG tablet Take 1 tablet (25 mg total) by mouth daily.  . Melatonin 10 MG TABS Take by mouth at bedtime.  . metoprolol tartrate (LOPRESSOR) 25 MG tablet Take 1 tablet (25 mg total) by mouth daily.  . Misc Natural Products  (TURMERIC CURCUMIN) CAPS Take 1 capsule by mouth daily. 1000 mg  . Omega-3 Fatty Acids (FISH OIL) 1000 MG CAPS Take by mouth.  . psyllium (METAMUCIL) 58.6 % powder Take 1 packet by mouth as needed.  . simvastatin (ZOCOR) 20 MG tablet Take 1 tablet (20 mg total) by mouth daily.  . traZODone (DESYREL) 50 MG tablet Take 50 mg by mouth at bedtime as needed for sleep.  Marland Kitchen zinc gluconate 50 MG tablet Take 50 mg by mouth daily. 2-3 times per week  . EPINEPHrine 0.3 mg/0.3 mL IJ SOAJ injection INJECT 0.3 MLS (0.3 MG TOTAL) INTO THE MUSCLE AS NEEDED (ANAPHYLAXIS). FOR UP TO 1 DOSE  . [DISCONTINUED] polyethylene glycol (MIRALAX / GLYCOLAX) packet Take 17 g by mouth daily.   No facility-administered encounter medications on file  as of 07/15/2019.    Activities of Daily Living In your present state of health, do you have any difficulty performing the following activities: 07/15/2019  Hearing? N  Comment declines hearing aids  Vision? N  Difficulty concentrating or making decisions? N  Walking or climbing stairs? N  Dressing or bathing? N  Doing errands, shopping? N  Preparing Food and eating ? N  Using the Toilet? N  In the past six months, have you accidently leaked urine? N  Do you have problems with loss of bowel control? N  Managing your Medications? N  Managing your Finances? N  Housekeeping or managing your Housekeeping? N  Some recent data might be hidden    Patient Care Team: Glean Hess, MD as PCP - General (Internal Medicine) Lerry Paterson, MD as Referring Physician (Surgical Oncology) Dr. Geryl Councilman (Ophthalmology) Manya Silvas, MD (Inactive) (Gastroenterology) Arna Medici, MD as Referring Physician (Allergy and Immunology) Nat Math, MD as Referring Physician (Oncology)    Assessment:   This is a routine wellness examination for [place].  Exercise Activities and Dietary recommendations Current Exercise Habits: The patient does not participate in  regular exercise at present, Exercise limited by: orthopedic condition(s)  Goals    . DIET - INCREASE WATER INTAKE     Recommend to drink at least 6-8 8oz glasses of water per day.       Fall Risk Fall Risk  07/15/2019 06/17/2019 01/12/2019 08/28/2018 07/14/2018  Falls in the past year? 0 0 0 1 0  Comment - - - - -  Number falls in past yr: 0 0 0 1 0  Injury with Fall? 0 0 0 0 0  Comment - - - - -  Risk Factor Category  - - - - -  Risk for fall due to : No Fall Risks No Fall Risks - - -  Risk for fall due to: Comment - - - - -  Follow up Falls prevention discussed Falls evaluation completed Falls evaluation completed Falls evaluation completed Falls prevention discussed   FALL RISK PREVENTION PERTAINING TO THE HOME:  Any stairs in or around the home? Yes  If so, do they handrails? Yes   Home free of loose throw rugs in walkways, pet beds, electrical cords, etc? Yes  Adequate lighting in your home to reduce risk of falls? Yes   ASSISTIVE DEVICES UTILIZED TO PREVENT FALLS:  Life alert? No  Use of a cane, walker or w/c? No  Grab bars in the bathroom? Yes  Shower chair or bench in shower? No  Elevated toilet seat or a handicapped toilet? No   DME ORDERS:  DME order needed?  No   TIMED UP AND GO:  Was the test performed? No .  Telephonic visit.   Education: Fall risk prevention has been discussed.  Intervention(s) required? No    Depression Screen PHQ 2/9 Scores 07/15/2019 06/17/2019 05/05/2019 01/12/2019  PHQ - 2 Score 0 4 2 0  PHQ- 9 Score 2 6 4  0     Cognitive Function     6CIT Screen 07/14/2018 07/10/2017 07/06/2016  What Year? 0 points 0 points 0 points  What month? 0 points 0 points 0 points  What time? 0 points 0 points 0 points  Count back from 20 0 points 0 points 0 points  Months in reverse 0 points 0 points 0 points  Repeat phrase 0 points 0 points 0 points  Total Score 0 0 0  Immunization History  Administered Date(s) Administered  . Fluad Quad(high  Dose 65+) 12/11/2018  . Influenza, High Dose Seasonal PF 11/25/2014, 11/24/2015, 11/22/2016, 12/17/2017  . PFIZER SARS-COV-2 Vaccination 04/18/2019, 05/10/2019  . Pneumococcal Conjugate-13 02/02/2013  . Pneumococcal Polysaccharide-23 11/16/2010, 02/03/2014  . Tdap 06/10/2009  . Zoster 01/25/2018  . Zoster Recombinat (Shingrix) 01/25/2018, 10/25/2018    Qualifies for Shingles Vaccine? Shingrix series completed.   Tdap: Although this vaccine is not a covered service during a Wellness Exam, does the patient still wish to receive this vaccine today?  No .  Education has been provided regarding the importance of this vaccine. Advised may receive this vaccine at local pharmacy or Health Dept. Aware to provide a copy of the vaccination record if obtained from local pharmacy or Health Dept. Verbalized acceptance and understanding.  Flu Vaccine: Up to date  Pneumococcal Vaccine: Up to date  Covid-19 Vaccine: Up to date    Screening Tests Health Maintenance  Topic Date Due  . TETANUS/TDAP  06/16/2020 (Originally 06/11/2019)  . INFLUENZA VACCINE  09/27/2019  . MAMMOGRAM  11/10/2019  . COLONOSCOPY  05/27/2020  . DEXA SCAN  Completed  . COVID-19 Vaccine  Completed  . PNA vac Low Risk Adult  Completed  . Hepatitis C Screening  Addressed    Cancer Screenings:  Colorectal Screening: Completed 05/28/10. Repeat every 10 years;  Mammogram: Completed 11/10/18. Repeat every year.  Bone Density: Completed 06/24/17. Results reflect  OSTEOPENIA. Repeat every 2 years. Pt declines repeat screening at this time.   Lung Cancer Screening: (Low Dose CT Chest recommended if Age 99-80 years, 30 pack-year currently smoking OR have quit w/in 15years.) does not qualify.   Additional Screening:  Hepatitis C Screening: does qualify; Completed 02/03/14  Vision Screening: Recommended annual ophthalmology exams for early detection of glaucoma and other disorders of the eye. Is the patient up to date with their  annual eye exam?  Yes  Who is the provider or what is the name of the office in which the pt attends annual eye exams? Dr. Bridgett Larsson  Dental Screening: Recommended annual dental exams for proper oral hygiene  Community Resource Referral:  CRR required this visit?  No      Plan:     I have personally reviewed and addressed the Medicare Annual Wellness questionnaire and have noted the following in the patient's chart:  A. Medical and social history B. Use of alcohol, tobacco or illicit drugs  C. Current medications and supplements D. Functional ability and status E.  Nutritional status F.  Physical activity G. Advance directives H. List of other physicians I.  Hospitalizations, surgeries, and ER visits in previous 12 months J.  [place] such as hearing and vision if needed, cognitive and depression L. Referrals and appointments   In addition, I have reviewed and discussed with patient certain preventive protocols, quality metrics, and best practice recommendations. A written personalized care plan for preventive services as well as general preventive health recommendations were provided to patient.   Signed,  Alexandra Marker, LPN Nurse Health Advisor   Nurse Notes: none

## 2019-07-15 NOTE — Patient Instructions (Signed)
Alexandra Watson , Thank you for taking time to come for your Medicare Wellness Visit. I appreciate your ongoing commitment to your health goals. Please review the following plan we discussed and let me know if I can assist you in the future.   Screening recommendations/referrals: Colonoscopy: done 05/28/10 Mammogram: done 11/10/18 Bone Density: done 06/24/17 Recommended yearly ophthalmology/optometry visit for glaucoma screening and checkup Recommended yearly dental visit for hygiene and checkup  Vaccinations: Influenza vaccine: done 12/11/18 Pneumococcal vaccine: done 02/03/14 Tdap vaccine: done 06/10/09 Shingles vaccine: done 01/25/18 & 10/25/18   Covid-19: done 04/18/19 & 05/10/19  Advanced directives: Advance directive discussed with you today. I have provided a copy for you to complete at home and have notarized. Once this is complete please bring a copy in to our office so we can scan it into your chart.  Conditions/risks identified: Keep up the great work!  Next appointment: Please follow up in one year for your Medicare Annual Wellness visit.     Preventive Care 28 Years and Older, Female Preventive care refers to lifestyle choices and visits with your health care provider that can promote health and wellness. What does preventive care include?  A yearly physical exam. This is also called an annual well check.  Dental exams once or twice a year.  Routine eye exams. Ask your health care provider how often you should have your eyes checked.  Personal lifestyle choices, including:  Daily care of your teeth and gums.  Regular physical activity.  Eating a healthy diet.  Avoiding tobacco and drug use.  Limiting alcohol use.  Practicing safe sex.  Taking low-dose aspirin every day.  Taking vitamin and mineral supplements as recommended by your health care provider. What happens during an annual well check? The services and screenings done by your health care provider during  your annual well check will depend on your age, overall health, lifestyle risk factors, and family history of disease. Counseling  Your health care provider may ask you questions about your:  Alcohol use.  Tobacco use.  Drug use.  Emotional well-being.  Home and relationship well-being.  Sexual activity.  Eating habits.  History of falls.  Memory and ability to understand (cognition).  Work and work Statistician.  Reproductive health. Screening  You may have the following tests or measurements:  Height, weight, and BMI.  Blood pressure.  Lipid and cholesterol levels. These may be checked every 5 years, or more frequently if you are over 39 years old.  Skin check.  Lung cancer screening. You may have this screening every year starting at age 47 if you have a 30-pack-year history of smoking and currently smoke or have quit within the past 15 years.  Fecal occult blood test (FOBT) of the stool. You may have this test every year starting at age 58.  Flexible sigmoidoscopy or colonoscopy. You may have a sigmoidoscopy every 5 years or a colonoscopy every 10 years starting at age 52.  Hepatitis C blood test.  Hepatitis B blood test.  Sexually transmitted disease (STD) testing.  Diabetes screening. This is done by checking your blood sugar (glucose) after you have not eaten for a while (fasting). You may have this done every 1-3 years.  Bone density scan. This is done to screen for osteoporosis. You may have this done starting at age 15.  Mammogram. This may be done every 1-2 years. Talk to your health care provider about how often you should have regular mammograms. Talk with your health care  provider about your test results, treatment options, and if necessary, the need for more tests. Vaccines  Your health care provider may recommend certain vaccines, such as:  Influenza vaccine. This is recommended every year.  Tetanus, diphtheria, and acellular pertussis (Tdap,  Td) vaccine. You may need a Td booster every 10 years.  Zoster vaccine. You may need this after age 82.  Pneumococcal 13-valent conjugate (PCV13) vaccine. One dose is recommended after age 43.  Pneumococcal polysaccharide (PPSV23) vaccine. One dose is recommended after age 84. Talk to your health care provider about which screenings and vaccines you need and how often you need them. This information is not intended to replace advice given to you by your health care provider. Make sure you discuss any questions you have with your health care provider. Document Released: 03/11/2015 Document Revised: 11/02/2015 Document Reviewed: 12/14/2014 Elsevier Interactive Patient Education  2017 [place] Prevention in the Home Falls can cause injuries. They can happen to people of all ages. There are many things you can do to make your home safe and to help prevent falls. What can I do on the outside of my home?  Regularly fix the edges of walkways and driveways and fix any cracks.  Remove anything that might make you trip as you walk through a door, such as a raised step or threshold.  Trim any bushes or trees on the path to your home.  Use bright outdoor lighting.  Clear any walking paths of anything that might make someone trip, such as rocks or tools.  Regularly check to see if handrails are loose or broken. Make sure that both sides of any steps have handrails.  Any raised decks and porches should have guardrails on the edges.  Have any leaves, snow, or ice cleared regularly.  Use sand or salt on walking paths during winter.  Clean up any spills in your garage right away. This includes oil or grease spills. What can I do in the bathroom?  Use night lights.  Install grab bars by the toilet and in the tub and shower. Do not use towel bars as grab bars.  Use non-skid mats or decals in the tub or shower.  If you need to sit down in the shower, use a plastic, non-slip stool.   Keep the floor dry. Clean up any water that spills on the floor as soon as it happens.  Remove soap buildup in the tub or shower regularly.  Attach bath mats securely with double-sided non-slip rug tape.  Do not have throw rugs and other things on the floor that can make you trip. What can I do in the bedroom?  Use night lights.  Make sure that you have a light by your bed that is easy to reach.  Do not use any sheets or blankets that are too big for your bed. They should not hang down onto the floor.  Have a firm chair that has side arms. You can use this for support while you get dressed.  Do not have throw rugs and other things on the floor that can make you trip. What can I do in the kitchen?  Clean up any spills right away.  Avoid walking on wet floors.  Keep items that you use a lot in easy-to-reach places.  If you need to reach something above you, use a strong step stool that has a grab bar.  Keep electrical cords out of the way.  Do not use floor polish  or wax that makes floors slippery. If you must use wax, use non-skid floor wax.  Do not have throw rugs and other things on the floor that can make you trip. What can I do with my stairs?  Do not leave any items on the stairs.  Make sure that there are handrails on both sides of the stairs and use them. Fix handrails that are broken or loose. Make sure that handrails are as long as the stairways.  Check any carpeting to make sure that it is firmly attached to the stairs. Fix any carpet that is loose or worn.  Avoid having throw rugs at the top or bottom of the stairs. If you do have throw rugs, attach them to the floor with carpet tape.  Make sure that you have a light switch at the top of the stairs and the bottom of the stairs. If you do not have them, ask someone to add them for you. What else can I do to help prevent falls?  Wear shoes that:  Do not have high heels.  Have rubber bottoms.  Are  comfortable and fit you well.  Are closed at the toe. Do not wear sandals.  If you use a stepladder:  Make sure that it is fully opened. Do not climb a closed stepladder.  Make sure that both sides of the stepladder are locked into place.  Ask someone to hold it for you, if possible.  Clearly mark and make sure that you can see:  Any grab bars or handrails.  First and last steps.  Where the edge of each step is.  Use tools that help you move around (mobility aids) if they are needed. These include:  Canes.  Walkers.  Scooters.  Crutches.  Turn on the lights when you go into a dark area. Replace any light bulbs as soon as they burn out.  Set up your furniture so you have a clear path. Avoid moving your furniture around.  If any of your floors are uneven, fix them.  If there are any pets around you, be aware of where they are.  Review your medicines with your doctor. Some medicines can make you feel dizzy. This can increase your chance of falling. Ask your doctor what other things that you can do to help prevent falls. This information is not intended to replace advice given to you by your health care provider. Make sure you discuss any questions you have with your health care provider. Document Released: 12/09/2008 Document Revised: 07/21/2015 Document Reviewed: 03/19/2014 Elsevier Interactive Patient Education  2017 [employer].

## 2019-08-10 DIAGNOSIS — M1712 Unilateral primary osteoarthritis, left knee: Secondary | ICD-10-CM | POA: Diagnosis not present

## 2019-08-17 DIAGNOSIS — M1712 Unilateral primary osteoarthritis, left knee: Secondary | ICD-10-CM | POA: Diagnosis not present

## 2019-08-24 DIAGNOSIS — M1712 Unilateral primary osteoarthritis, left knee: Secondary | ICD-10-CM | POA: Diagnosis not present

## 2019-09-14 DIAGNOSIS — R69 Illness, unspecified: Secondary | ICD-10-CM | POA: Diagnosis not present

## 2019-09-19 ENCOUNTER — Other Ambulatory Visit: Payer: Self-pay | Admitting: Internal Medicine

## 2019-09-19 DIAGNOSIS — F411 Generalized anxiety disorder: Secondary | ICD-10-CM

## 2019-10-08 DIAGNOSIS — G8929 Other chronic pain: Secondary | ICD-10-CM | POA: Diagnosis not present

## 2019-10-08 DIAGNOSIS — M1990 Unspecified osteoarthritis, unspecified site: Secondary | ICD-10-CM | POA: Diagnosis not present

## 2019-10-08 DIAGNOSIS — R69 Illness, unspecified: Secondary | ICD-10-CM | POA: Diagnosis not present

## 2019-10-08 DIAGNOSIS — R079 Chest pain, unspecified: Secondary | ICD-10-CM | POA: Diagnosis not present

## 2019-10-08 DIAGNOSIS — E785 Hyperlipidemia, unspecified: Secondary | ICD-10-CM | POA: Diagnosis not present

## 2019-10-08 DIAGNOSIS — M25562 Pain in left knee: Secondary | ICD-10-CM | POA: Diagnosis not present

## 2019-10-08 DIAGNOSIS — C7A098 Malignant carcinoid tumors of other sites: Secondary | ICD-10-CM | POA: Diagnosis not present

## 2019-10-08 DIAGNOSIS — C7A090 Malignant carcinoid tumor of the bronchus and lung: Secondary | ICD-10-CM | POA: Diagnosis not present

## 2019-10-08 DIAGNOSIS — C7B02 Secondary carcinoid tumors of liver: Secondary | ICD-10-CM | POA: Diagnosis not present

## 2019-10-08 DIAGNOSIS — C787 Secondary malignant neoplasm of liver and intrahepatic bile duct: Secondary | ICD-10-CM | POA: Diagnosis not present

## 2019-10-08 DIAGNOSIS — R918 Other nonspecific abnormal finding of lung field: Secondary | ICD-10-CM | POA: Diagnosis not present

## 2019-10-08 DIAGNOSIS — I10 Essential (primary) hypertension: Secondary | ICD-10-CM | POA: Diagnosis not present

## 2019-10-15 DIAGNOSIS — M1712 Unilateral primary osteoarthritis, left knee: Secondary | ICD-10-CM | POA: Diagnosis not present

## 2019-10-21 ENCOUNTER — Other Ambulatory Visit: Payer: Self-pay

## 2019-10-21 ENCOUNTER — Ambulatory Visit (INDEPENDENT_AMBULATORY_CARE_PROVIDER_SITE_OTHER): Payer: Medicare HMO | Admitting: Internal Medicine

## 2019-10-21 ENCOUNTER — Encounter: Payer: Self-pay | Admitting: Internal Medicine

## 2019-10-21 ENCOUNTER — Other Ambulatory Visit (HOSPITAL_COMMUNITY)
Admission: RE | Admit: 2019-10-21 | Discharge: 2019-10-21 | Disposition: A | Discharge status: Home / Self Care | Payer: Medicare HMO | Source: Ambulatory Visit | Attending: Internal Medicine | Admitting: Internal Medicine

## 2019-10-21 VITALS — BP 138/62 | HR 75 | Ht 64.0 in | Wt 146.0 lb

## 2019-10-21 DIAGNOSIS — Z9189 Other specified personal risk factors, not elsewhere classified: Secondary | ICD-10-CM | POA: Insufficient documentation

## 2019-10-21 DIAGNOSIS — I10 Essential (primary) hypertension: Secondary | ICD-10-CM

## 2019-10-21 DIAGNOSIS — G629 Polyneuropathy, unspecified: Secondary | ICD-10-CM | POA: Diagnosis not present

## 2019-10-21 DIAGNOSIS — Z91018 Allergy to other foods: Secondary | ICD-10-CM | POA: Diagnosis not present

## 2019-10-21 MED ORDER — GABAPENTIN 300 MG PO CAPS: 300.0000 mg | ORAL_CAPSULE | Freq: Every day | ORAL | 1 refills | Status: DC

## 2019-10-21 NOTE — Progress Notes (Signed)
Date:  10/21/2019   Name:  Alexandra Watson   DOB:  1945-06-05   MRN:  [id number]   Chief Complaint: cervical cancer screening (Pt requesting a pap smear for screening purposes since she has cancer currently - she wants this more ease of mind. ) and Hypertension (Follow up.) Waiting on Oncology to see if she qualifies for immunotherapy for her lung cancer with liver met.  She feels well and is trying to be positive. Hypertension This is a chronic problem. The problem is controlled. Pertinent negatives include no chest pain, headaches, palpitations or shortness of breath. Past treatments include beta blockers and diuretics. The current treatment provides significant improvement. There are no compliance problems.   Gynecologic Exam The patient's pertinent negatives include no pelvic pain, vaginal bleeding or vaginal discharge. The patient is experiencing no pain. Pertinent negatives include no abdominal pain, dysuria, fever, headaches or hematuria.  Insomnia Primary symptoms: sleep disturbance, difficulty falling asleep.  The problem occurs nightly. Past treatments include medication (taking gabapentin for neuropathic ankle pain originally but it really helps with sleep - better than trazodone).  Alpha-gal - pt would like to have this tested again.  It was last done in October 2020.  She has completely avoided mammalian meat but would love a hamburger.   Lab Results  Component Value Date   CREATININE 0.79 01/12/2019   BUN 15 01/12/2019   NA 139 01/12/2019   K 4.2 01/12/2019   CL 99 01/12/2019   CO2 28 01/12/2019   Lab Results  Component Value Date   CHOL 158 01/12/2019   HDL 50 01/12/2019   LDLCALC 87 01/12/2019   TRIG 114 01/12/2019   CHOLHDL 3.2 01/12/2019   Lab Results  Component Value Date   TSH 1.300 01/12/2019   No results found for: HGBA1C Lab Results  Component Value Date   WBC 4.2 01/12/2019   HGB 12.0 01/12/2019   HCT 36.0 01/12/2019   MCV 87 01/12/2019   PLT  246 01/12/2019   Lab Results  Component Value Date   ALT 25 01/12/2019   AST 25 01/12/2019   ALKPHOS 80 01/12/2019   BILITOT 0.3 01/12/2019     Review of Systems  Constitutional: Negative for appetite change, fatigue, fever and unexpected weight change.  HENT: Negative for trouble swallowing.   Eyes: Negative for visual disturbance.  Respiratory: Negative for cough, chest tightness and shortness of breath.   Cardiovascular: Negative for chest pain, palpitations and leg swelling.  Gastrointestinal: Negative for abdominal pain.  Genitourinary: Negative for dysuria, hematuria, pelvic pain, vaginal bleeding and vaginal discharge.  Musculoskeletal: Positive for arthralgias.  Neurological: Negative for tremors, numbness and headaches.  Psychiatric/Behavioral: Positive for sleep disturbance. Negative for dysphoric mood. The patient has insomnia.     Patient Active Problem List   Diagnosis Date Noted  . Allergy to alpha-gal 06/17/2019  . 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 Anaphylaxis due to food, initial encounter 10/08/2018  . Primary osteoarthritis of left knee 09/17/2018  . Anaphylactic syndrome 08/28/2018  . Generalized anxiety disorder 11/02/2017  . Weight loss, abnormal 10/11/2017  . Neuropathy 07/06/2016  . SUI (stress urinary incontinence, female) 11/11/2015  . Incomplete emptying of bladder 11/11/2015  . Recurrent UTI 11/10/2015  . Arthritis of knee, left 07/01/2015  . Insomnia 06/30/2015  . Atrophic vaginitis 06/30/2015  . Gastroesophageal reflux disease 06/30/2015  . Osteopenia 05/20/2015  . Anemia 09/14/2012  . Constipation 08/27/2012  . Essential hypertension 10/04/2011  . Hyperlipidemia,  mixed 10/04/2011    Allergies  Allergen Reactions  . Galactose Anaphylaxis, Anxiety, Diarrhea, Itching and Shortness Of Breath  . Iodinated Diagnostic Agents Hives  . Oxycodone Nausea And Vomiting and Nausea Only    Past Surgical History:  Procedure  Laterality Date  . ANKLE ARTHROSCOPY WITH OPEN REDUCTION INTERNAL FIXATION (ORIF)  07/2015   tri-malleolar  . BUNIONECTOMY Bilateral 2001, 2005  . CHOLECYSTECTOMY    . COLONOSCOPY  05/2010   normal  . GANGLION CYST EXCISION Left   . THORACOTOMY / DECORTICATION PARIETAL PLEURA Left 06/2017   [facility]  . 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 Smoking status: Former Smoker    Packs/day: 0.25    Years: 20.00    Pack years: 5.00    Types: Cigarettes    Quit date: 1985    Years since quitting: 36.6  . Smokeless tobacco: Never Used  . Tobacco comment: smoking cessation materials not required  Vaping Use  . Vaping Use: Never used  Substance Use Topics  . Alcohol use: Yes    Alcohol/week: 1.0 standard drink    Types: 1 Glasses of wine per week  . Drug use: No     Medication list has been reviewed and updated.  Current Meds  Medication Sig  . Ascorbic Acid (VITAMIN C WITH ROSE HIPS) 500 MG tablet Take 500 mg by mouth daily.  Marland Kitchen buPROPion (WELLBUTRIN XL) 150 MG 24 hr tablet TAKE 1 TABLET BY MOUTH EVERY DAY  . Calcium Carbonate-Vit D-Min (CALCIUM 1200 PO) Take 2,400 mg by mouth.  . CHOLECALCIFEROL PO Take 1 tablet by mouth daily. 5000 iu  . EPINEPHrine 0.3 mg/0.3 mL IJ SOAJ injection INJECT 0.3 MLS (0.3 MG TOTAL) INTO THE MUSCLE AS NEEDED (ANAPHYLAXIS). FOR UP TO 1 DOSE  . gabapentin (NEURONTIN) 300 MG capsule Take 1 capsule (300 mg total) by mouth daily. (Patient taking differently: Take 300 mg by mouth at bedtime. )  . hydrochlorothiazide (HYDRODIURIL) 25 MG tablet Take 1 tablet (25 mg total) by mouth daily.  . Melatonin 10 MG TABS Take by mouth at bedtime.  . metoprolol tartrate (LOPRESSOR) 25 MG tablet Take 1 tablet (25 mg total) by mouth daily.  . Misc Natural Products (TURMERIC CURCUMIN) CAPS Take 1 capsule by mouth daily. 1000 mg  . Omega-3 Fatty Acids (FISH OIL) 1000 MG CAPS Take by mouth.  . psyllium (METAMUCIL) 58.6  % powder Take 1 packet by mouth as needed.  . simvastatin (ZOCOR) 20 MG tablet Take 1 tablet (20 mg total) by mouth daily.  . traZODone (DESYREL) 50 MG tablet Take 50 mg by mouth at bedtime as needed for sleep.  Marland Kitchen zinc gluconate 50 MG tablet Take 50 mg by mouth daily. 2-3 times per week    PHQ 2/9 Scores 10/21/2019 07/15/2019 06/17/2019 05/05/2019  PHQ - 2 Score 2 0 4 2  PHQ- 9 Score '2 2 6 4    ' GAD 7 : Generalized Anxiety Score 06/17/2019 05/05/2019 10/11/2017  Nervous, Anxious, on Edge '2 2 3  ' Control/stop worrying '2 3 3  ' Worry too much - different things '2 3 3  ' Trouble relaxing 1 0 3  Restless 0 0 3  Easily annoyed or irritable 0 0 0  Afraid - awful might happen 0 0 3  Total GAD 7 Score '7 8 18  ' Anxiety Difficulty Not difficult at all Not difficult at all Extremely difficult    BP  Readings from Last 3 Encounters:  10/21/19 138/62  06/17/19 (!) 114/58  05/05/19 138/62    Physical Exam Vitals and nursing note reviewed.  Constitutional:      General: She is not in acute distress.    Appearance: Normal appearance. She is well-developed.  HENT:     Head: Normocephalic and atraumatic.  Cardiovascular:     Rate and Rhythm: Normal rate and regular rhythm.     Pulses: Normal pulses.     Heart sounds: No murmur heard.   Pulmonary:     Effort: Pulmonary effort is normal. No respiratory distress.     Breath sounds: No wheezing or rhonchi.  Genitourinary:    Labia:        Right: No tenderness, lesion or injury.        Left: No tenderness, lesion or injury.      Vagina: Normal.     Cervix: Normal.     Uterus: Normal.      Adnexa: Right adnexa normal and left adnexa normal.     Comments: Vaginal atrophy without lesions or bleeding Skin:    General: Skin is warm and dry.     Findings: No rash.  Neurological:     General: No focal deficit present.     Mental Status: She is alert and oriented to person, place, and time.  Psychiatric:        Mood and Affect: Mood normal.     Wt  Readings from Last 3 Encounters:  10/21/19 146 lb (66.2 kg)  06/17/19 147 lb (66.7 kg)  05/05/19 152 lb (68.9 kg)    BP 138/62   Pulse 75   Ht '5\' 4"'  (1.626 m)   Wt 146 lb (66.2 kg)   SpO2 98%   BMI 25.06 kg/m   Assessment and Plan: 1. Essential hypertension Clinically stable exam with well controlled BP on beta blocker and hctz. Tolerating medications without side effects at this time. Pt to continue current regimen and low sodium diet; benefits of regular exercise as able discussed.  2. Encounter for gynecologic examination for high-risk patient covered by Medicare Pap obtained Exam normal for age - Cytology - PAP  3. Allergy to alpha-gal Repeat labs and then advise if she can try beef - Alpha-Gal Panel  4. Neuropathy Improved but medication also helps with sleep Will stop Trazodone - gabapentin (NEURONTIN) 300 MG capsule; Take 1 capsule (300 mg total) by mouth at bedtime.  Dispense: 90 capsule; Refill: 1   Partially dictated using Editor, commissioning. Any errors are unintentional.  Halina Maidens, MD [facility]  10/21/2019

## 2019-10-23 ENCOUNTER — Other Ambulatory Visit: Payer: Self-pay | Admitting: Internal Medicine

## 2019-10-23 DIAGNOSIS — F411 Generalized anxiety disorder: Secondary | ICD-10-CM

## 2019-10-26 LAB — CYTOLOGY - PAP
Comment: NEGATIVE
Diagnosis: NEGATIVE
High risk HPV: NEGATIVE

## 2019-10-27 LAB — ALPHA-GAL PANEL
Alpha Gal IgE*: 1.15 kU/L — ABNORMAL HIGH (ref <0.10)
Beef (Bos spp) IgE: 0.45 kU/L — ABNORMAL HIGH (ref <0.35)
Class Interpretation: 1
Lamb/Mutton (Ovis spp) IgE: 0.15 kU/L (ref <0.35)
Pork (Sus spp) IgE: 0.11 kU/L (ref <0.35)

## 2019-11-20 DIAGNOSIS — C7B09 Secondary carcinoid tumors of other sites: Secondary | ICD-10-CM | POA: Diagnosis not present

## 2019-11-20 DIAGNOSIS — C7B02 Secondary carcinoid tumors of liver: Secondary | ICD-10-CM | POA: Diagnosis not present

## 2019-11-20 DIAGNOSIS — C7A090 Malignant carcinoid tumor of the bronchus and lung: Secondary | ICD-10-CM | POA: Diagnosis not present

## 2019-11-20 DIAGNOSIS — C7B03 Secondary carcinoid tumors of bone: Secondary | ICD-10-CM | POA: Diagnosis not present

## 2019-11-30 ENCOUNTER — Other Ambulatory Visit: Payer: Self-pay | Admitting: Internal Medicine

## 2019-11-30 DIAGNOSIS — R69 Illness, unspecified: Secondary | ICD-10-CM | POA: Diagnosis not present

## 2019-11-30 DIAGNOSIS — Z1231 Encounter for screening mammogram for malignant neoplasm of breast: Secondary | ICD-10-CM

## 2019-12-03 ENCOUNTER — Ambulatory Visit
Admission: RE | Admit: 2019-12-03 | Discharge: 2019-12-03 | Disposition: A | Discharge status: Home / Self Care | Payer: Medicare HMO | Source: Ambulatory Visit | Attending: Internal Medicine | Admitting: Internal Medicine

## 2019-12-03 ENCOUNTER — Other Ambulatory Visit: Payer: Self-pay

## 2019-12-03 DIAGNOSIS — Z1231 Encounter for screening mammogram for malignant neoplasm of breast: Secondary | ICD-10-CM | POA: Insufficient documentation

## 2019-12-06 ENCOUNTER — Other Ambulatory Visit: Payer: Self-pay | Admitting: Internal Medicine

## 2019-12-06 DIAGNOSIS — E782 Mixed hyperlipidemia: Secondary | ICD-10-CM

## 2019-12-06 NOTE — Telephone Encounter (Signed)
Requested Prescriptions  Pending Prescriptions Disp Refills  . simvastatin (ZOCOR) 20 MG tablet [Pharmacy Med Name: SIMVASTATIN 20 MG TABLET] 30 tablet 0    Sig: TAKE 1 TABLET BY MOUTH EVERY DAY     Cardiovascular:  Antilipid - Statins Failed - 12/06/2019  9:21 AM      Failed - LDL in normal range and within 360 days    LDL Chol Calc (NIH)  Date Value Ref Range Status  01/12/2019 87 0 - 99 mg/dL Final         Passed - Total Cholesterol in normal range and within 360 days    Cholesterol, Total  Date Value Ref Range Status  01/12/2019 158 100 - 199 mg/dL Final         Passed - HDL in normal range and within 360 days    HDL  Date Value Ref Range Status  01/12/2019 50 >39 mg/dL Final         Passed - Triglycerides in normal range and within 360 days    Triglycerides  Date Value Ref Range Status  01/12/2019 114 0 - 149 mg/dL Final         Passed - Patient is not pregnant      Passed - Valid encounter within last 12 months    Recent Outpatient Visits          1 month ago Essential hypertension   [place], MD   5 months ago Generalized anxiety disorder   [place], MD   7 months ago Generalized anxiety disorder   [facility]an [facility], MD   10 months ago Annual physical exam   [facility] Glean Hess, MD   1 year ago Change in voice   [facility] Glean Hess, MD

## 2019-12-07 DIAGNOSIS — B078 Other viral warts: Secondary | ICD-10-CM | POA: Diagnosis not present

## 2019-12-07 DIAGNOSIS — L821 Other seborrheic keratosis: Secondary | ICD-10-CM | POA: Diagnosis not present

## 2019-12-07 DIAGNOSIS — L659 Nonscarring hair loss, unspecified: Secondary | ICD-10-CM | POA: Diagnosis not present

## 2019-12-07 DIAGNOSIS — L298 Other pruritus: Secondary | ICD-10-CM | POA: Diagnosis not present

## 2019-12-31 DIAGNOSIS — C7A090 Malignant carcinoid tumor of the bronchus and lung: Secondary | ICD-10-CM | POA: Diagnosis not present

## 2019-12-31 DIAGNOSIS — Z09 Encounter for follow-up examination after completed treatment for conditions other than malignant neoplasm: Secondary | ICD-10-CM | POA: Diagnosis not present

## 2020-01-01 ENCOUNTER — Other Ambulatory Visit: Payer: Self-pay | Admitting: Internal Medicine

## 2020-01-01 DIAGNOSIS — E782 Mixed hyperlipidemia: Secondary | ICD-10-CM

## 2020-01-01 NOTE — Telephone Encounter (Signed)
Requested medication (s) are due for refill today: yes   Requested medication (s) are on the active medication list: yes   Last refill:  12/06/19 #30 0 refills   Future visit scheduled: no  Notes to clinic:  courtesy refill given on last refill, called patient to schedule appt no answer left voicemail to call back to clinic. Do you want another courtesy refill?     Requested Prescriptions  Pending Prescriptions Disp Refills   simvastatin (ZOCOR) 20 MG tablet [Pharmacy Med Name: SIMVASTATIN 20 MG TABLET] 30 tablet 0    Sig: TAKE 1 TABLET BY MOUTH EVERY DAY      Cardiovascular:  Antilipid - Statins Failed - 01/01/2020  1:32 PM      Failed - LDL in normal range and within 360 days    LDL Chol Calc (NIH)  Date Value Ref Range Status  01/12/2019 87 0 - 99 mg/dL Final          Passed - Total Cholesterol in normal range and within 360 days    Cholesterol, Total  Date Value Ref Range Status  01/12/2019 158 100 - 199 mg/dL Final          Passed - HDL in normal range and within 360 days    HDL  Date Value Ref Range Status  01/12/2019 50 >39 mg/dL Final          Passed - Triglycerides in normal range and within 360 days    Triglycerides  Date Value Ref Range Status  01/12/2019 114 0 - 149 mg/dL Final          Passed - Patient is not pregnant      Passed - Valid encounter within last 12 months    Recent Outpatient Visits           2 months ago Essential hypertension   [place], MD   6 months ago Generalized anxiety disorder   [place], MD   8 months ago Generalized anxiety disorder   [facility] Glean [facility], MD   11 months ago Annual physical exam   [facility] Glean Hess, MD   1 year ago Change in voice   [facility] Glean Hess, MD

## 2020-01-05 ENCOUNTER — Ambulatory Visit: Payer: Medicare HMO | Admitting: Internal Medicine

## 2020-01-07 DIAGNOSIS — R69 Illness, unspecified: Secondary | ICD-10-CM | POA: Diagnosis not present

## 2020-01-12 ENCOUNTER — Ambulatory Visit (INDEPENDENT_AMBULATORY_CARE_PROVIDER_SITE_OTHER): Payer: Medicare HMO | Admitting: Internal Medicine

## 2020-01-12 ENCOUNTER — Other Ambulatory Visit: Payer: Self-pay

## 2020-01-12 ENCOUNTER — Encounter: Payer: Self-pay | Admitting: Internal Medicine

## 2020-01-12 VITALS — BP 142/72 | HR 71 | Ht 64.0 in | Wt 147.0 lb

## 2020-01-12 DIAGNOSIS — I10 Essential (primary) hypertension: Secondary | ICD-10-CM | POA: Diagnosis not present

## 2020-01-12 DIAGNOSIS — H6121 Impacted cerumen, right ear: Secondary | ICD-10-CM | POA: Diagnosis not present

## 2020-01-12 DIAGNOSIS — G629 Polyneuropathy, unspecified: Secondary | ICD-10-CM

## 2020-01-12 DIAGNOSIS — E782 Mixed hyperlipidemia: Secondary | ICD-10-CM | POA: Diagnosis not present

## 2020-01-12 DIAGNOSIS — C7A090 Malignant carcinoid tumor of the bronchus and lung: Secondary | ICD-10-CM

## 2020-01-12 MED ORDER — METOPROLOL TARTRATE 25 MG PO TABS: 25.0000 mg | ORAL_TABLET | Freq: Every day | ORAL | 1 refills | Status: DC

## 2020-01-12 MED ORDER — HYDROCHLOROTHIAZIDE 25 MG PO TABS: 25.0000 mg | ORAL_TABLET | Freq: Every day | ORAL | 1 refills | Status: DC

## 2020-01-12 MED ORDER — SIMVASTATIN 20 MG PO TABS: 20.0000 mg | ORAL_TABLET | Freq: Every day | ORAL | 1 refills | Status: DC

## 2020-01-12 NOTE — Progress Notes (Signed)
Date:  01/12/2020   Name:  Alexandra Watson   DOB:  Mar 25, 1945   MRN:  [id number]   Chief Complaint: Hypertension (Follow up) and Anxiety (PHQ and GAD is 0. Follow up.)  Hypertension This is a chronic problem. The problem is controlled. Pertinent negatives include no chest pain, headaches, palpitations or shortness of breath. Past treatments include beta blockers and diuretics.  Hyperlipidemia This is a chronic problem. The problem is controlled. Pertinent negatives include no chest pain or shortness of breath. Current antihyperlipidemic treatment includes statins. The current treatment provides significant improvement of lipids.  Neuropathy - started gabapentin 300 mg qhs last visit but patient is not taking it due to lack of benefit. Metastatic Carcinoid - she is receiving infusions at [facility].  Plan for 4 infusions of Lutathera 8 weeks apart.  She is tolerating it well but is now seeing her hair thinning.  Overall she is coping well.  Lab Results  Component Value Date   CREATININE 0.79 01/12/2019   BUN 15 01/12/2019   NA 139 01/12/2019   K 4.2 01/12/2019   CL 99 01/12/2019   CO2 28 01/12/2019   Lab Results  Component Value Date   CHOL 158 01/12/2019   HDL 50 01/12/2019   LDLCALC 87 01/12/2019   TRIG 114 01/12/2019   CHOLHDL 3.2 01/12/2019   Lab Results  Component Value Date   TSH 1.300 01/12/2019   No results found for: HGBA1C Lab Results  Component Value Date   WBC 4.2 01/12/2019   HGB 12.0 01/12/2019   HCT 36.0 01/12/2019   MCV 87 01/12/2019   PLT 246 01/12/2019   Lab Results  Component Value Date   ALT 25 01/12/2019   AST 25 01/12/2019   ALKPHOS 80 01/12/2019   BILITOT 0.3 01/12/2019     Review of Systems  Constitutional: Negative for chills, fatigue and unexpected weight change.  HENT: Positive for hearing loss (right ear feels stopped up) and nosebleeds (stopped aspirin). Negative for mouth sores and trouble swallowing.   Respiratory:  Negative for cough, chest tightness and shortness of breath.   Cardiovascular: Negative for chest pain, palpitations and leg swelling.  Gastrointestinal: Negative for anal bleeding, blood in stool, constipation and diarrhea.  Musculoskeletal: Negative for arthralgias (left knee).  Skin:       Hair loss  Neurological: Negative for dizziness and headaches.  Psychiatric/Behavioral: Negative for dysphoric mood and sleep disturbance. The patient is not nervous/anxious.     Patient Active Problem List   Diagnosis Date Noted  . Allergy to alpha-gal 06/17/2019  . 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 Anaphylaxis due to food, initial encounter 10/08/2018  . Primary osteoarthritis of left knee 09/17/2018  . Anaphylactic syndrome 08/28/2018  . Generalized anxiety disorder 11/02/2017  . Weight loss, abnormal 10/11/2017  . Neuropathy 07/06/2016  . SUI (stress urinary incontinence, female) 11/11/2015  . Incomplete emptying of bladder 11/11/2015  . Recurrent UTI 11/10/2015  . Arthritis of knee, left 07/01/2015  . Insomnia 06/30/2015  . Atrophic vaginitis 06/30/2015  . Gastroesophageal reflux disease 06/30/2015  . Osteopenia 05/20/2015  . Anemia 09/14/2012  . Constipation 08/27/2012  . Essential hypertension 10/04/2011  . Hyperlipidemia, mixed 10/04/2011    Allergies  Allergen Reactions  . Galactose Anaphylaxis, Anxiety, Diarrhea, Itching and Shortness Of Breath  . Iodinated Diagnostic Agents Hives  . Oxycodone Nausea And Vomiting and Nausea Only    Past Surgical History:  Procedure Laterality Date  .  ANKLE ARTHROSCOPY WITH OPEN REDUCTION INTERNAL FIXATION (ORIF)  07/2015   tri-malleolar  . BUNIONECTOMY Bilateral 2001, 2005  . CHOLECYSTECTOMY    . COLONOSCOPY  05/2010   normal  . GANGLION CYST EXCISION Left   . THORACOTOMY / DECORTICATION PARIETAL PLEURA Left 06/2017   [facility]  . VATS Sleeve lobectomy Left 09/2011   typical variant carcinoid    Social History    Tobacco Use  . Smoking status: Former Smoker    Packs/day: 0.25    Years: 20.00    Pack years: 5.00    Types: Cigarettes    Quit date: 1985    Years since quitting: 36.8  . Smokeless tobacco: Never Used  . Tobacco comment: smoking cessation materials not required  Vaping Use  . Vaping Use: Never used  Substance Use Topics  . Alcohol use: Yes    Alcohol/week: 1.0 standard drink    Types: 1 Glasses of wine per week  . Drug use: No     Medication list has been reviewed and updated.  Current Meds  Medication Sig  . Ascorbic Acid (VITAMIN C WITH ROSE HIPS) 500 MG tablet Take 500 mg by mouth daily.  Marland Kitchen buPROPion (WELLBUTRIN XL) 150 MG 24 hr tablet TAKE 1 TABLET BY MOUTH EVERY DAY  . Calcium Carbonate-Vit D-Min (CALCIUM 1200 PO) Take 2,400 mg by mouth.  . CHOLECALCIFEROL PO Take 1 tablet by mouth daily. 5000 iu  . EPINEPHrine 0.3 mg/0.3 mL IJ SOAJ injection INJECT 0.3 MLS (0.3 MG TOTAL) INTO THE MUSCLE AS NEEDED (ANAPHYLAXIS). FOR UP TO 1 DOSE  . hydrochlorothiazide (HYDRODIURIL) 25 MG tablet Take 1 tablet (25 mg total) by mouth daily.  . Melatonin 10 MG TABS Take by mouth at bedtime.  . metoprolol tartrate (LOPRESSOR) 25 MG tablet Take 1 tablet (25 mg total) by mouth daily.  . Misc Natural Products (TURMERIC CURCUMIN) CAPS Take 1 capsule by mouth daily. 1000 mg  . Omega-3 Fatty Acids (FISH OIL) 1000 MG CAPS Take by mouth.  . psyllium (METAMUCIL) 58.6 % powder Take 1 packet by mouth as needed.  . simvastatin (ZOCOR) 20 MG tablet TAKE 1 TABLET BY MOUTH EVERY DAY  . zinc gluconate 50 MG tablet Take 50 mg by mouth daily. 2-3 times per week    PHQ 2/9 Scores 01/12/2020 10/21/2019 07/15/2019 06/17/2019  PHQ - 2 Score 0 2 0 4  PHQ- 9 Score 0 2 2 6     GAD 7 : Generalized Anxiety Score 01/12/2020 06/17/2019 05/05/2019 10/11/2017  Nervous, Anxious, on Edge 0 2 2 3   Control/stop worrying 0 2 3 3   Worry too much - different things 0 2 3 3   Trouble relaxing 0 1 0 3  Restless 0 0 0 3   Easily annoyed or irritable 0 0 0 0  Afraid - awful might happen 0 0 0 3  Total GAD 7 Score 0 7 8 18   Anxiety Difficulty Not difficult at all Not difficult at all Not difficult at all Extremely difficult    BP Readings from Last 3 Encounters:  01/12/20 (!) 142/72  10/21/19 138/62  06/17/19 (!) 114/58    Physical Exam Vitals and nursing note reviewed.  Constitutional:      General: She is not in acute distress.    Appearance: Normal appearance. She is well-developed.  HENT:     Head: Normocephalic and atraumatic.     Right Ear: There is impacted cerumen.     Left Ear: Hearing, tympanic membrane and ear canal  normal.     Ears:     Comments: Large amount of hard cerumen removed from right ear with curette.  Pt had return of normal hearing.   Cardiovascular:     Rate and Rhythm: Normal rate and regular rhythm.     Pulses: Normal pulses.     Heart sounds: No murmur heard.   Pulmonary:     Effort: Pulmonary effort is normal. No respiratory distress.     Breath sounds: No wheezing or rhonchi.  Musculoskeletal:        General: Normal range of motion.     Cervical back: Normal range of motion.     Right lower leg: No edema.     Left lower leg: No edema.  Lymphadenopathy:     Cervical: No cervical adenopathy.  Skin:    General: Skin is warm and dry.     Capillary Refill: Capillary refill takes less than 2 seconds.     Findings: No rash.     Comments: Hair thinning  Neurological:     General: No focal deficit present.     Mental Status: She is alert and oriented to person, place, and time.  Psychiatric:        Attention and Perception: Attention normal.        Mood and Affect: Mood normal.     Wt Readings from Last 3 Encounters:  01/12/20 147 lb (66.7 kg)  10/21/19 146 lb (66.2 kg)  06/17/19 147 lb (66.7 kg)    BP (!) 142/72   Pulse 71   Ht 5\' 4"  (1.626 m)   Wt 147 lb (66.7 kg)   SpO2 99%   BMI 25.23 kg/m   Assessment and Plan: 1. Essential  hypertension Clinically stable exam with well controlled BP on metoprolol and hctz. Tolerating medications without side effects at this time. Pt to continue current regimen and low sodium diet; benefits of regular exercise as able discussed. - hydrochlorothiazide (HYDRODIURIL) 25 MG tablet; Take 1 tablet (25 mg total) by mouth daily.  Dispense: 90 tablet; Refill: 1 - metoprolol tartrate (LOPRESSOR) 25 MG tablet; Take 1 tablet (25 mg total) by mouth daily.  Dispense: 90 tablet; Refill: 1 - Comprehensive metabolic panel  2. Neuropathy Stable sx - pt prefers to remain off of any therapy for now  3. Hyperlipidemia, mixed Tolerating statin therapy without side effects - simvastatin (ZOCOR) 20 MG tablet; Take 1 tablet (20 mg total) by mouth daily.  Dispense: 90 tablet; Refill: 1 - Lipid panel  4. Hearing loss of right ear due to cerumen impaction Removed with curette without complications and return of normal hearing  5. 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 Hair loss complication - she should discuss options for wigs at next appt Requesting CBC and CMP one month after the third infusion in February.   Partially dictated using Editor, commissioning. Any errors are unintentional.  Halina Maidens, MD [facility]  01/12/2020

## 2020-01-13 ENCOUNTER — Ambulatory Visit: Payer: Medicare HMO

## 2020-01-13 LAB — COMPREHENSIVE METABOLIC PANEL
ALT: 36 IU/L — ABNORMAL HIGH (ref 0–32)
AST: 28 IU/L (ref 0–40)
Albumin/Globulin Ratio: 2.2 (ref 1.2–2.2)
Albumin: 4.8 g/dL — ABNORMAL HIGH (ref 3.7–4.7)
Alkaline Phosphatase: 101 IU/L (ref 44–121)
BUN/Creatinine Ratio: 16 (ref 12–28)
BUN: 12 mg/dL (ref 8–27)
Bilirubin Total: 0.3 mg/dL (ref 0.0–1.2)
CO2: 27 mmol/L (ref 20–29)
Calcium: 9.9 mg/dL (ref 8.7–10.3)
Chloride: 98 mmol/L (ref 96–106)
Creatinine, Ser: 0.74 mg/dL (ref 0.57–1.00)
GFR calc Af Amer: 92 mL/min/{1.73_m2} (ref >59)
GFR calc non Af Amer: 80 mL/min/{1.73_m2} (ref >59)
Globulin, Total: 2.2 g/dL (ref 1.5–4.5)
Glucose: 85 mg/dL (ref 65–99)
Potassium: 4.4 mmol/L (ref 3.5–5.2)
Sodium: 139 mmol/L (ref 134–144)
Total Protein: 7 g/dL (ref 6.0–8.5)

## 2020-01-13 LAB — LIPID PANEL
Chol/HDL Ratio: 2.6 ratio (ref 0.0–4.4)
Cholesterol, Total: 139 mg/dL (ref 100–199)
HDL: 54 mg/dL (ref >39)
LDL Chol Calc (NIH): 67 mg/dL (ref 0–99)
Triglycerides: 99 mg/dL (ref 0–149)
VLDL Cholesterol Cal: 18 mg/dL (ref 5–40)

## 2020-01-15 DIAGNOSIS — C787 Secondary malignant neoplasm of liver and intrahepatic bile duct: Secondary | ICD-10-CM | POA: Diagnosis not present

## 2020-01-15 DIAGNOSIS — C7951 Secondary malignant neoplasm of bone: Secondary | ICD-10-CM | POA: Diagnosis not present

## 2020-01-15 DIAGNOSIS — C7A090 Malignant carcinoid tumor of the bronchus and lung: Secondary | ICD-10-CM | POA: Diagnosis not present

## 2020-01-15 DIAGNOSIS — E3120 Multiple endocrine neoplasia [MEN] syndrome, unspecified: Secondary | ICD-10-CM | POA: Diagnosis not present

## 2020-01-15 DIAGNOSIS — C7A8 Other malignant neuroendocrine tumors: Secondary | ICD-10-CM | POA: Diagnosis not present

## 2020-01-29 DIAGNOSIS — L03213 Periorbital cellulitis: Secondary | ICD-10-CM | POA: Diagnosis not present

## 2020-02-09 ENCOUNTER — Ambulatory Visit: Payer: Medicare HMO

## 2020-02-09 DIAGNOSIS — H00012 Hordeolum externum right lower eyelid: Secondary | ICD-10-CM | POA: Diagnosis not present

## 2020-02-10 DIAGNOSIS — C7A090 Malignant carcinoid tumor of the bronchus and lung: Secondary | ICD-10-CM | POA: Diagnosis not present

## 2020-02-10 DIAGNOSIS — C787 Secondary malignant neoplasm of liver and intrahepatic bile duct: Secondary | ICD-10-CM | POA: Diagnosis not present

## 2020-02-11 DIAGNOSIS — C7A090 Malignant carcinoid tumor of the bronchus and lung: Secondary | ICD-10-CM | POA: Diagnosis not present

## 2020-02-11 DIAGNOSIS — K7689 Other specified diseases of liver: Secondary | ICD-10-CM | POA: Diagnosis not present

## 2020-02-11 DIAGNOSIS — Z09 Encounter for follow-up examination after completed treatment for conditions other than malignant neoplasm: Secondary | ICD-10-CM | POA: Diagnosis not present

## 2020-02-17 ENCOUNTER — Ambulatory Visit
Admission: RE | Admit: 2020-02-17 | Discharge: 2020-02-17 | Disposition: A | Discharge status: Home / Self Care | Payer: Medicare HMO | Source: Ambulatory Visit | Attending: Internal Medicine | Admitting: Internal Medicine

## 2020-02-17 ENCOUNTER — Other Ambulatory Visit: Payer: Self-pay

## 2020-02-17 DIAGNOSIS — Z1231 Encounter for screening mammogram for malignant neoplasm of breast: Secondary | ICD-10-CM | POA: Insufficient documentation

## 2020-03-01 DIAGNOSIS — D2261 Melanocytic nevi of right upper limb, including shoulder: Secondary | ICD-10-CM | POA: Diagnosis not present

## 2020-03-01 DIAGNOSIS — D2271 Melanocytic nevi of right lower limb, including hip: Secondary | ICD-10-CM | POA: Diagnosis not present

## 2020-03-01 DIAGNOSIS — D225 Melanocytic nevi of trunk: Secondary | ICD-10-CM | POA: Diagnosis not present

## 2020-03-01 DIAGNOSIS — D2262 Melanocytic nevi of left upper limb, including shoulder: Secondary | ICD-10-CM | POA: Diagnosis not present

## 2020-03-01 DIAGNOSIS — L821 Other seborrheic keratosis: Secondary | ICD-10-CM | POA: Diagnosis not present

## 2020-03-01 DIAGNOSIS — D2272 Melanocytic nevi of left lower limb, including hip: Secondary | ICD-10-CM | POA: Diagnosis not present

## 2020-03-01 DIAGNOSIS — L820 Inflamed seborrheic keratosis: Secondary | ICD-10-CM | POA: Diagnosis not present

## 2020-03-01 DIAGNOSIS — D485 Neoplasm of uncertain behavior of skin: Secondary | ICD-10-CM | POA: Diagnosis not present

## 2020-03-02 DIAGNOSIS — H11221 Conjunctival granuloma, right eye: Secondary | ICD-10-CM | POA: Diagnosis not present

## 2020-03-02 DIAGNOSIS — H5203 Hypermetropia, bilateral: Secondary | ICD-10-CM | POA: Diagnosis not present

## 2020-03-11 DIAGNOSIS — C7B02 Secondary carcinoid tumors of liver: Secondary | ICD-10-CM | POA: Diagnosis not present

## 2020-03-11 DIAGNOSIS — C7B09 Secondary carcinoid tumors of other sites: Secondary | ICD-10-CM | POA: Diagnosis not present

## 2020-03-11 DIAGNOSIS — C787 Secondary malignant neoplasm of liver and intrahepatic bile duct: Secondary | ICD-10-CM | POA: Diagnosis not present

## 2020-03-11 DIAGNOSIS — C7B03 Secondary carcinoid tumors of bone: Secondary | ICD-10-CM | POA: Diagnosis not present

## 2020-03-11 DIAGNOSIS — C782 Secondary malignant neoplasm of pleura: Secondary | ICD-10-CM | POA: Diagnosis not present

## 2020-03-11 DIAGNOSIS — C7A090 Malignant carcinoid tumor of the bronchus and lung: Secondary | ICD-10-CM | POA: Diagnosis not present

## 2020-03-11 DIAGNOSIS — C7951 Secondary malignant neoplasm of bone: Secondary | ICD-10-CM | POA: Diagnosis not present

## 2020-03-24 DIAGNOSIS — H11221 Conjunctival granuloma, right eye: Secondary | ICD-10-CM | POA: Diagnosis not present

## 2020-04-04 ENCOUNTER — Telehealth: Payer: Self-pay

## 2020-04-04 NOTE — Telephone Encounter (Signed)
Spoke to pt let her know that we can not place labs for another office. Let pt know that she would have to call [place] and have them place the lab order for her at [place] pt she could come to get her labs done in the same building as the office. Pt verbalized understanding and will give [facility] a call.  KP

## 2020-04-04 NOTE — Telephone Encounter (Signed)
Pt would like to provide the fax number to [facility];  [place] or Wille Glaser

## 2020-04-04 NOTE — Telephone Encounter (Unsigned)
Copied from [place] [id number]. Topic: General - Other >> Apr 04, 2020  2:43 PM Wynetta Emery, [place] C wrote: Reason for CRM: pt called in for assistance. Pt says that she is suppose to have labs completed for [facility]. Pt says that she discussed with PCP having labs completed at the lab downstairs and having PCP faxed them to [facility]. Pt would like assistance with this. Pt says that she also was told by PCP that she need to have her Galatose checked.   Labs needed for [facility]: CMP; CBC with Dis; DBC HGB & platelet    Please assist Electa Sniff pt further

## 2020-04-07 DIAGNOSIS — H11221 Conjunctival granuloma, right eye: Secondary | ICD-10-CM | POA: Diagnosis not present

## 2020-04-07 DIAGNOSIS — C7A090 Malignant carcinoid tumor of the bronchus and lung: Secondary | ICD-10-CM | POA: Diagnosis not present

## 2020-05-06 DIAGNOSIS — C7A8 Other malignant neuroendocrine tumors: Secondary | ICD-10-CM | POA: Diagnosis not present

## 2020-05-06 DIAGNOSIS — C7A090 Malignant carcinoid tumor of the bronchus and lung: Secondary | ICD-10-CM | POA: Diagnosis not present

## 2020-05-06 DIAGNOSIS — C7B8 Other secondary neuroendocrine tumors: Secondary | ICD-10-CM | POA: Diagnosis not present

## 2020-05-20 DIAGNOSIS — C7A090 Malignant carcinoid tumor of the bronchus and lung: Secondary | ICD-10-CM | POA: Diagnosis not present

## 2020-05-26 DIAGNOSIS — Z902 Acquired absence of lung [part of]: Secondary | ICD-10-CM | POA: Diagnosis not present

## 2020-05-26 DIAGNOSIS — C787 Secondary malignant neoplasm of liver and intrahepatic bile duct: Secondary | ICD-10-CM | POA: Diagnosis not present

## 2020-05-26 DIAGNOSIS — Z85118 Personal history of other malignant neoplasm of bronchus and lung: Secondary | ICD-10-CM | POA: Diagnosis not present

## 2020-05-26 DIAGNOSIS — K7689 Other specified diseases of liver: Secondary | ICD-10-CM | POA: Diagnosis not present

## 2020-05-26 DIAGNOSIS — C7A090 Malignant carcinoid tumor of the bronchus and lung: Secondary | ICD-10-CM | POA: Diagnosis not present

## 2020-06-08 DIAGNOSIS — C7A090 Malignant carcinoid tumor of the bronchus and lung: Secondary | ICD-10-CM | POA: Diagnosis not present

## 2020-07-10 ENCOUNTER — Other Ambulatory Visit: Payer: Self-pay | Admitting: Internal Medicine

## 2020-07-10 DIAGNOSIS — E782 Mixed hyperlipidemia: Secondary | ICD-10-CM

## 2020-07-10 DIAGNOSIS — I10 Essential (primary) hypertension: Secondary | ICD-10-CM

## 2020-07-10 NOTE — Telephone Encounter (Signed)
Pt overdue appt for BP and med refill. Last RF 01/12/20 #90 1 RF.  30 day courtesy refill given. MyChart message sent to pt to call and make appt.

## 2020-07-13 ENCOUNTER — Telehealth: Payer: Self-pay | Admitting: Internal Medicine

## 2020-07-13 DIAGNOSIS — Z91018 Allergy to other foods: Secondary | ICD-10-CM | POA: Diagnosis not present

## 2020-07-13 DIAGNOSIS — M19072 Primary osteoarthritis, left ankle and foot: Secondary | ICD-10-CM | POA: Diagnosis not present

## 2020-07-13 DIAGNOSIS — R69 Illness, unspecified: Secondary | ICD-10-CM | POA: Diagnosis not present

## 2020-07-13 DIAGNOSIS — I10 Essential (primary) hypertension: Secondary | ICD-10-CM | POA: Diagnosis not present

## 2020-07-13 DIAGNOSIS — K219 Gastro-esophageal reflux disease without esophagitis: Secondary | ICD-10-CM | POA: Diagnosis not present

## 2020-07-13 DIAGNOSIS — C7A090 Malignant carcinoid tumor of the bronchus and lung: Secondary | ICD-10-CM | POA: Diagnosis not present

## 2020-07-13 DIAGNOSIS — G629 Polyneuropathy, unspecified: Secondary | ICD-10-CM | POA: Diagnosis not present

## 2020-07-13 DIAGNOSIS — M25462 Effusion, left knee: Secondary | ICD-10-CM | POA: Diagnosis not present

## 2020-07-13 DIAGNOSIS — M170 Bilateral primary osteoarthritis of knee: Secondary | ICD-10-CM | POA: Diagnosis not present

## 2020-07-13 DIAGNOSIS — N3020 Other chronic cystitis without hematuria: Secondary | ICD-10-CM | POA: Diagnosis not present

## 2020-07-13 DIAGNOSIS — M1712 Unilateral primary osteoarthritis, left knee: Secondary | ICD-10-CM | POA: Diagnosis not present

## 2020-07-13 NOTE — Telephone Encounter (Signed)
Copied from [place] [phone]. Topic: Medicare AWV >> Jul 13, 2020  1:26 PM Cher Nakai R wrote: Reason for CRM:  Left message for patient to call back and schedule Medicare Annual Wellness Visit (AWV) in office.   If unable to come into the office for AWV,  please offer to do virtually or by telephone.  Last AWV:  07/15/2019  Please schedule at anytime with [facility] Advisor.  40 minute appointment  Any questions, please contact me at [phone]

## 2020-07-22 ENCOUNTER — Encounter: Payer: Self-pay | Admitting: Internal Medicine

## 2020-07-22 ENCOUNTER — Ambulatory Visit (INDEPENDENT_AMBULATORY_CARE_PROVIDER_SITE_OTHER): Payer: Medicare HMO | Admitting: Internal Medicine

## 2020-07-22 ENCOUNTER — Other Ambulatory Visit: Payer: Self-pay

## 2020-07-22 VITALS — BP 132/70 | HR 69 | Ht 64.0 in | Wt 150.0 lb

## 2020-07-22 DIAGNOSIS — M1712 Unilateral primary osteoarthritis, left knee: Secondary | ICD-10-CM

## 2020-07-22 DIAGNOSIS — F411 Generalized anxiety disorder: Secondary | ICD-10-CM | POA: Diagnosis not present

## 2020-07-22 DIAGNOSIS — C7A090 Malignant carcinoid tumor of the bronchus and lung: Secondary | ICD-10-CM | POA: Diagnosis not present

## 2020-07-22 DIAGNOSIS — I10 Essential (primary) hypertension: Secondary | ICD-10-CM | POA: Diagnosis not present

## 2020-07-22 DIAGNOSIS — R69 Illness, unspecified: Secondary | ICD-10-CM | POA: Diagnosis not present

## 2020-07-22 MED ORDER — METOPROLOL TARTRATE 25 MG PO TABS
25.0000 mg | ORAL_TABLET | Freq: Every day | ORAL | 1 refills | Status: DC
Start: 2020-07-22 — End: 2021-01-10

## 2020-07-22 MED ORDER — BUPROPION HCL ER (XL) 150 MG PO TB24
1.0000 | ORAL_TABLET | Freq: Every day | ORAL | 1 refills | Status: DC
Start: 2020-07-22 — End: 2021-01-10

## 2020-07-22 NOTE — Progress Notes (Signed)
Date:  07/22/2020   Name:  Alexandra Watson   DOB:  1946-01-08   MRN:  [id number]   Chief Complaint: Hypertension and Knee Problem (See's [facility]- they are discussing a knee replacement left knee. Has appt with Dr Thurmond Butts June 21st. Wants to get a handicap placard for her walking issues.)  Hypertension This is a chronic problem. The problem is controlled. Pertinent negatives include no chest pain, headaches, palpitations or shortness of breath. Past treatments include beta blockers and diuretics. The current treatment provides significant improvement.  Knee Pain  There was no injury mechanism. The pain is present in the left knee. The quality of the pain is described as aching and cramping. The pain is moderate. The pain has been worsening since onset.  Depression        This is a chronic problem.The problem is unchanged.  Associated symptoms include no fatigue and no headaches.  Past treatments include other medications.  Compliance with treatment is good.  Previous treatment provided moderate relief.  CT chest 05/26/20: IMPRESSION:  1. No significant change in multiple left pleural nodules and masses  consistent with metastases.  2. Otherwise stable CT examination of the chest status post left upper  lobectomy.   CT abdomen 05/26/20: Impression:  Slightly decreased size of hypoattenuating hepatic metastasis measuring 5.4  cm, previously up to 6.7 cm when remeasured using a similar technique.    Lab Results  Component Value Date   CREATININE 0.74 01/12/2020   BUN 12 01/12/2020   NA 139 01/12/2020   K 4.4 01/12/2020   CL 98 01/12/2020   CO2 27 01/12/2020   Lab Results  Component Value Date   CHOL 139 01/12/2020   HDL 54 01/12/2020   LDLCALC 67 01/12/2020   TRIG 99 01/12/2020   CHOLHDL 2.6 01/12/2020   Lab Results  Component Value Date   TSH 1.300 01/12/2019   No results found for: HGBA1C Lab Results  Component Value Date   WBC 4.2 01/12/2019   HGB 12.0 01/12/2019    HCT 36.0 01/12/2019   MCV 87 01/12/2019   PLT 246 01/12/2019   Lab Results  Component Value Date   ALT 36 (H) 01/12/2020   AST 28 01/12/2020   ALKPHOS 101 01/12/2020   BILITOT 0.3 01/12/2020     Review of Systems  Constitutional: Negative for fatigue and unexpected weight change.  HENT: Negative for nosebleeds.   Eyes: Negative for visual disturbance.  Respiratory: Negative for cough, chest tightness, shortness of breath and wheezing.   Cardiovascular: Negative for chest pain, palpitations and leg swelling.  Gastrointestinal: Negative for abdominal pain, constipation and diarrhea.  Neurological: Negative for dizziness, weakness, light-headedness and headaches.  Psychiatric/Behavioral: Positive for depression.    Patient Active Problem List   Diagnosis Date Noted  . Allergy to alpha-gal 06/17/2019  . 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 Anaphylaxis due to food, initial encounter 10/08/2018  . Primary osteoarthritis of left knee 09/17/2018  . Anaphylactic syndrome 08/28/2018  . Generalized anxiety disorder 11/02/2017  . Weight loss, abnormal 10/11/2017  . Neuropathy 07/06/2016  . SUI (stress urinary incontinence, female) 11/11/2015  . Incomplete emptying of bladder 11/11/2015  . Recurrent UTI 11/10/2015  . Arthritis of knee, left 07/01/2015  . Insomnia 06/30/2015  . Atrophic vaginitis 06/30/2015  . Gastroesophageal reflux disease 06/30/2015  . Osteopenia 05/20/2015  . Anemia 09/14/2012  . Constipation 08/27/2012  . Essential hypertension 10/04/2011  . Hyperlipidemia, mixed 10/04/2011  Allergies  Allergen Reactions  . Galactose Anaphylaxis, Anxiety, Diarrhea, Itching and Shortness Of Breath  . Iodinated Diagnostic Agents Hives  . Oxycodone Nausea And Vomiting and Nausea Only    Past Surgical History:  Procedure Laterality Date  . ANKLE ARTHROSCOPY WITH OPEN REDUCTION INTERNAL FIXATION (ORIF)  07/2015   tri-malleolar  . BUNIONECTOMY Bilateral  2001, 2005  . CHOLECYSTECTOMY    . COLONOSCOPY  05/2010   normal  . GANGLION CYST EXCISION Left   . THORACOTOMY / DECORTICATION PARIETAL PLEURA Left 06/2017   [facility]  . 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 Smoking status: Former Smoker    Packs/day: 0.25    Years: 20.00    Pack years: 5.00    Types: Cigarettes    Quit date: 1985    Years since quitting: 37.4  . Smokeless tobacco: Never Used  . Tobacco comment: smoking cessation materials not required  Vaping Use  . Vaping Use: Never used  Substance Use Topics  . Alcohol use: Yes    Alcohol/week: 1.0 standard drink    Types: 1 Glasses of wine per week  . Drug use: No     Medication list has been reviewed and updated.  Current Meds  Medication Sig  . Ascorbic Acid (VITAMIN C WITH ROSE HIPS) 500 MG tablet Take 500 mg by mouth daily.  Marland Kitchen buPROPion (WELLBUTRIN XL) 150 MG 24 hr tablet TAKE 1 TABLET BY MOUTH EVERY DAY  . Calcium Carbonate-Vit D-Min (CALCIUM 1200 PO) Take 2,400 mg by mouth.  . CHOLECALCIFEROL PO Take 1 tablet by mouth daily. 5000 iu  . EPINEPHrine 0.3 mg/0.3 mL IJ SOAJ injection INJECT 0.3 MLS (0.3 MG TOTAL) INTO THE MUSCLE AS NEEDED (ANAPHYLAXIS). FOR UP TO 1 DOSE  . hydrochlorothiazide (HYDRODIURIL) 25 MG tablet Take 1 tablet (25 mg total) by mouth daily.  . Melatonin 10 MG TABS Take by mouth at bedtime.  . metoprolol tartrate (LOPRESSOR) 25 MG tablet TAKE 1 TABLET BY MOUTH EVERY DAY  . Misc Natural Products (TURMERIC CURCUMIN) CAPS Take 1 capsule by mouth daily. 1000 mg  . Omega-3 Fatty Acids (FISH OIL) 1000 MG CAPS Take by mouth.  . psyllium (METAMUCIL) 58.6 % powder Take 1 packet by mouth as needed.  . simvastatin (ZOCOR) 20 MG tablet TAKE 1 TABLET BY MOUTH EVERY DAY  . zinc gluconate 50 MG tablet Take 50 mg by mouth daily. 2-3 times per week    PHQ 2/9 Scores 07/22/2020 01/12/2020 10/21/2019 07/15/2019  PHQ - 2 Score 0 0 2 0  PHQ- 9 Score 0 0 2 2     GAD 7 : Generalized Anxiety Score 07/22/2020 01/12/2020 06/17/2019 05/05/2019  Nervous, Anxious, on Edge 0 0 2 2  Control/stop worrying 0 0 2 3  Worry too much - different things 0 0 2 3  Trouble relaxing 0 0 1 0  Restless 0 0 0 0  Easily annoyed or irritable 0 0 0 0  Afraid - awful might happen 0 0 0 0  Total GAD 7 Score 0 0 7 8  Anxiety Difficulty Not difficult at all Not difficult at all Not difficult at all Not difficult at all    BP Readings from Last 3 Encounters:  07/22/20 132/70  01/12/20 (!) 142/72  10/21/19 138/62    Physical Exam Vitals and nursing note reviewed.  Constitutional:      General: She is not in acute distress.  Appearance: She is well-developed.  HENT:     Head: Normocephalic and atraumatic.  Cardiovascular:     Rate and Rhythm: Normal rate and regular rhythm.     Pulses: Normal pulses.     Heart sounds: No murmur heard.   Pulmonary:     Effort: Pulmonary effort is normal. No respiratory distress.     Breath sounds: No wheezing or rhonchi.  Musculoskeletal:     Cervical back: Normal range of motion.     Left knee: Decreased range of motion. Tenderness present.     Right lower leg: No edema.     Left lower leg: No edema.     Left ankle: Swelling present.  Lymphadenopathy:     Cervical: No cervical adenopathy.  Skin:    General: Skin is warm and dry.     Findings: No rash.  Neurological:     Mental Status: She is alert and oriented to person, place, and time.  Psychiatric:        Attention and Perception: Attention normal.        Mood and Affect: Mood normal. Affect is tearful.        Behavior: Behavior normal.        Thought Content: Thought content does not include suicidal plan.        Cognition and Memory: Cognition normal.     Wt Readings from Last 3 Encounters:  07/22/20 150 lb (68 kg)  01/12/20 147 lb (66.7 kg)  10/21/19 146 lb (66.2 kg)    BP 132/70   Pulse 69   Ht 5\' 4"  (1.626 m)   Wt 150 lb (68 kg)   SpO2 97%   BMI  25.75 kg/m   Assessment and Plan: 1. Essential hypertension Clinically stable exam with well controlled BP. Tolerating medications without side effects at this time. Pt to continue current regimen and low sodium diet; benefits of regular exercise as able discussed. - metoprolol tartrate (LOPRESSOR) 25 MG tablet; Take 1 tablet (25 mg total) by mouth daily.  Dispense: 100 tablet; Refill: 1  2. Generalized anxiety disorder Doing fairly well on bupropion; she is not eager to resume Zoloft at this time She more blue recently since she lost her brother in March to heart disease. - buPROPion (WELLBUTRIN XL) 150 MG 24 hr tablet; Take 1 tablet (150 mg total) by mouth daily.  Dispense: 100 tablet; Refill: 1  3. 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 She feels well. No current immunotherapy.  Will have follow up in June.  4. Arthritis of knee, left Proceed with ortho workup for possible TKA Handicapped parking placard application given.   Partially dictated using Editor, commissioning. Any errors are unintentional.  Halina Maidens, MD [facility]  07/22/2020

## 2020-08-10 ENCOUNTER — Other Ambulatory Visit: Payer: Self-pay | Admitting: Internal Medicine

## 2020-08-10 DIAGNOSIS — I10 Essential (primary) hypertension: Secondary | ICD-10-CM

## 2020-08-16 DIAGNOSIS — M799 Soft tissue disorder, unspecified: Secondary | ICD-10-CM | POA: Diagnosis not present

## 2020-08-16 DIAGNOSIS — Z85118 Personal history of other malignant neoplasm of bronchus and lung: Secondary | ICD-10-CM | POA: Diagnosis not present

## 2020-08-16 DIAGNOSIS — M7989 Other specified soft tissue disorders: Secondary | ICD-10-CM | POA: Diagnosis not present

## 2020-08-16 DIAGNOSIS — D649 Anemia, unspecified: Secondary | ICD-10-CM | POA: Diagnosis not present

## 2020-08-16 DIAGNOSIS — M1712 Unilateral primary osteoarthritis, left knee: Secondary | ICD-10-CM | POA: Diagnosis not present

## 2020-08-16 DIAGNOSIS — M25562 Pain in left knee: Secondary | ICD-10-CM | POA: Diagnosis not present

## 2020-08-16 DIAGNOSIS — Z87891 Personal history of nicotine dependence: Secondary | ICD-10-CM | POA: Diagnosis not present

## 2020-08-25 DIAGNOSIS — C7802 Secondary malignant neoplasm of left lung: Secondary | ICD-10-CM | POA: Diagnosis not present

## 2020-08-25 DIAGNOSIS — R69 Illness, unspecified: Secondary | ICD-10-CM | POA: Diagnosis not present

## 2020-08-25 DIAGNOSIS — Z902 Acquired absence of lung [part of]: Secondary | ICD-10-CM | POA: Diagnosis not present

## 2020-08-25 DIAGNOSIS — C787 Secondary malignant neoplasm of liver and intrahepatic bile duct: Secondary | ICD-10-CM | POA: Diagnosis not present

## 2020-08-25 DIAGNOSIS — I10 Essential (primary) hypertension: Secondary | ICD-10-CM | POA: Diagnosis not present

## 2020-08-25 DIAGNOSIS — C7A090 Malignant carcinoid tumor of the bronchus and lung: Secondary | ICD-10-CM | POA: Diagnosis not present

## 2020-08-25 DIAGNOSIS — K7689 Other specified diseases of liver: Secondary | ICD-10-CM | POA: Diagnosis not present

## 2020-08-26 DIAGNOSIS — S83242A Other tear of medial meniscus, current injury, left knee, initial encounter: Secondary | ICD-10-CM | POA: Diagnosis not present

## 2020-08-26 DIAGNOSIS — X58XXXA Exposure to other specified factors, initial encounter: Secondary | ICD-10-CM | POA: Diagnosis not present

## 2020-08-26 DIAGNOSIS — M659 Synovitis and tenosynovitis, unspecified: Secondary | ICD-10-CM | POA: Diagnosis not present

## 2020-08-26 DIAGNOSIS — M1712 Unilateral primary osteoarthritis, left knee: Secondary | ICD-10-CM | POA: Diagnosis not present

## 2020-08-26 DIAGNOSIS — M799 Soft tissue disorder, unspecified: Secondary | ICD-10-CM | POA: Diagnosis not present

## 2020-09-02 DIAGNOSIS — C7A090 Malignant carcinoid tumor of the bronchus and lung: Secondary | ICD-10-CM | POA: Diagnosis not present

## 2020-09-02 DIAGNOSIS — I10 Essential (primary) hypertension: Secondary | ICD-10-CM | POA: Diagnosis not present

## 2020-09-02 DIAGNOSIS — M1712 Unilateral primary osteoarthritis, left knee: Secondary | ICD-10-CM | POA: Diagnosis not present

## 2020-09-02 DIAGNOSIS — N390 Urinary tract infection, site not specified: Secondary | ICD-10-CM | POA: Diagnosis not present

## 2020-09-02 DIAGNOSIS — K219 Gastro-esophageal reflux disease without esophagitis: Secondary | ICD-10-CM | POA: Diagnosis not present

## 2020-09-02 DIAGNOSIS — N393 Stress incontinence (female) (male): Secondary | ICD-10-CM | POA: Diagnosis not present

## 2020-09-02 DIAGNOSIS — Z01818 Encounter for other preprocedural examination: Secondary | ICD-10-CM | POA: Diagnosis not present

## 2020-09-02 DIAGNOSIS — R339 Retention of urine, unspecified: Secondary | ICD-10-CM | POA: Diagnosis not present

## 2020-09-02 DIAGNOSIS — D638 Anemia in other chronic diseases classified elsewhere: Secondary | ICD-10-CM | POA: Diagnosis not present

## 2020-09-02 DIAGNOSIS — M1990 Unspecified osteoarthritis, unspecified site: Secondary | ICD-10-CM | POA: Diagnosis not present

## 2020-09-19 DIAGNOSIS — Z20822 Contact with and (suspected) exposure to covid-19: Secondary | ICD-10-CM | POA: Diagnosis not present

## 2020-09-21 DIAGNOSIS — M2342 Loose body in knee, left knee: Secondary | ICD-10-CM | POA: Diagnosis not present

## 2020-09-21 DIAGNOSIS — M8580 Other specified disorders of bone density and structure, unspecified site: Secondary | ICD-10-CM | POA: Diagnosis not present

## 2020-09-21 DIAGNOSIS — G629 Polyneuropathy, unspecified: Secondary | ICD-10-CM | POA: Diagnosis not present

## 2020-09-21 DIAGNOSIS — M24562 Contracture, left knee: Secondary | ICD-10-CM | POA: Diagnosis not present

## 2020-09-21 DIAGNOSIS — E785 Hyperlipidemia, unspecified: Secondary | ICD-10-CM | POA: Diagnosis not present

## 2020-09-21 DIAGNOSIS — M170 Bilateral primary osteoarthritis of knee: Secondary | ICD-10-CM | POA: Diagnosis not present

## 2020-09-21 DIAGNOSIS — I10 Essential (primary) hypertension: Secondary | ICD-10-CM | POA: Diagnosis not present

## 2020-09-21 DIAGNOSIS — K219 Gastro-esophageal reflux disease without esophagitis: Secondary | ICD-10-CM | POA: Diagnosis not present

## 2020-09-21 DIAGNOSIS — K5900 Constipation, unspecified: Secondary | ICD-10-CM | POA: Diagnosis not present

## 2020-09-21 DIAGNOSIS — E7800 Pure hypercholesterolemia, unspecified: Secondary | ICD-10-CM | POA: Diagnosis not present

## 2020-09-21 DIAGNOSIS — M25762 Osteophyte, left knee: Secondary | ICD-10-CM | POA: Diagnosis not present

## 2020-09-21 DIAGNOSIS — N393 Stress incontinence (female) (male): Secondary | ICD-10-CM | POA: Diagnosis not present

## 2020-09-21 DIAGNOSIS — Z96652 Presence of left artificial knee joint: Secondary | ICD-10-CM | POA: Diagnosis not present

## 2020-09-21 DIAGNOSIS — M1712 Unilateral primary osteoarthritis, left knee: Secondary | ICD-10-CM | POA: Diagnosis not present

## 2020-09-21 DIAGNOSIS — G250 Essential tremor: Secondary | ICD-10-CM | POA: Diagnosis not present

## 2020-09-21 DIAGNOSIS — Z471 Aftercare following joint replacement surgery: Secondary | ICD-10-CM | POA: Diagnosis not present

## 2020-09-22 DIAGNOSIS — I10 Essential (primary) hypertension: Secondary | ICD-10-CM | POA: Diagnosis not present

## 2020-09-22 DIAGNOSIS — M8580 Other specified disorders of bone density and structure, unspecified site: Secondary | ICD-10-CM | POA: Diagnosis not present

## 2020-09-22 DIAGNOSIS — E7800 Pure hypercholesterolemia, unspecified: Secondary | ICD-10-CM | POA: Diagnosis not present

## 2020-09-22 DIAGNOSIS — E785 Hyperlipidemia, unspecified: Secondary | ICD-10-CM | POA: Diagnosis not present

## 2020-09-22 DIAGNOSIS — K5900 Constipation, unspecified: Secondary | ICD-10-CM | POA: Diagnosis not present

## 2020-09-22 DIAGNOSIS — M170 Bilateral primary osteoarthritis of knee: Secondary | ICD-10-CM | POA: Diagnosis not present

## 2020-09-22 DIAGNOSIS — M1712 Unilateral primary osteoarthritis, left knee: Secondary | ICD-10-CM | POA: Diagnosis not present

## 2020-09-22 DIAGNOSIS — G629 Polyneuropathy, unspecified: Secondary | ICD-10-CM | POA: Diagnosis not present

## 2020-09-22 DIAGNOSIS — G250 Essential tremor: Secondary | ICD-10-CM | POA: Diagnosis not present

## 2020-09-22 DIAGNOSIS — N393 Stress incontinence (female) (male): Secondary | ICD-10-CM | POA: Diagnosis not present

## 2020-09-22 DIAGNOSIS — K219 Gastro-esophageal reflux disease without esophagitis: Secondary | ICD-10-CM | POA: Diagnosis not present

## 2020-09-23 DIAGNOSIS — G250 Essential tremor: Secondary | ICD-10-CM | POA: Diagnosis not present

## 2020-09-23 DIAGNOSIS — E7800 Pure hypercholesterolemia, unspecified: Secondary | ICD-10-CM | POA: Diagnosis not present

## 2020-09-23 DIAGNOSIS — K5900 Constipation, unspecified: Secondary | ICD-10-CM | POA: Diagnosis not present

## 2020-09-23 DIAGNOSIS — K219 Gastro-esophageal reflux disease without esophagitis: Secondary | ICD-10-CM | POA: Diagnosis not present

## 2020-09-23 DIAGNOSIS — E785 Hyperlipidemia, unspecified: Secondary | ICD-10-CM | POA: Diagnosis not present

## 2020-09-23 DIAGNOSIS — M8580 Other specified disorders of bone density and structure, unspecified site: Secondary | ICD-10-CM | POA: Diagnosis not present

## 2020-09-23 DIAGNOSIS — M170 Bilateral primary osteoarthritis of knee: Secondary | ICD-10-CM | POA: Diagnosis not present

## 2020-09-23 DIAGNOSIS — I10 Essential (primary) hypertension: Secondary | ICD-10-CM | POA: Diagnosis not present

## 2020-09-23 DIAGNOSIS — G629 Polyneuropathy, unspecified: Secondary | ICD-10-CM | POA: Diagnosis not present

## 2020-09-23 DIAGNOSIS — N393 Stress incontinence (female) (male): Secondary | ICD-10-CM | POA: Diagnosis not present

## 2020-09-24 DIAGNOSIS — Z7901 Long term (current) use of anticoagulants: Secondary | ICD-10-CM | POA: Diagnosis not present

## 2020-09-24 DIAGNOSIS — M1712 Unilateral primary osteoarthritis, left knee: Secondary | ICD-10-CM | POA: Diagnosis not present

## 2020-09-24 DIAGNOSIS — Z96651 Presence of right artificial knee joint: Secondary | ICD-10-CM | POA: Diagnosis not present

## 2020-09-24 DIAGNOSIS — G629 Polyneuropathy, unspecified: Secondary | ICD-10-CM | POA: Diagnosis not present

## 2020-09-24 DIAGNOSIS — Z471 Aftercare following joint replacement surgery: Secondary | ICD-10-CM | POA: Diagnosis not present

## 2020-09-24 DIAGNOSIS — K219 Gastro-esophageal reflux disease without esophagitis: Secondary | ICD-10-CM | POA: Diagnosis not present

## 2020-09-24 DIAGNOSIS — M8580 Other specified disorders of bone density and structure, unspecified site: Secondary | ICD-10-CM | POA: Diagnosis not present

## 2020-09-24 DIAGNOSIS — D638 Anemia in other chronic diseases classified elsewhere: Secondary | ICD-10-CM | POA: Diagnosis not present

## 2020-09-24 DIAGNOSIS — C7A090 Malignant carcinoid tumor of the bronchus and lung: Secondary | ICD-10-CM | POA: Diagnosis not present

## 2020-09-24 DIAGNOSIS — Z79891 Long term (current) use of opiate analgesic: Secondary | ICD-10-CM | POA: Diagnosis not present

## 2020-09-24 DIAGNOSIS — C7B02 Secondary carcinoid tumors of liver: Secondary | ICD-10-CM | POA: Diagnosis not present

## 2020-09-26 DIAGNOSIS — Z79891 Long term (current) use of opiate analgesic: Secondary | ICD-10-CM | POA: Diagnosis not present

## 2020-09-26 DIAGNOSIS — D638 Anemia in other chronic diseases classified elsewhere: Secondary | ICD-10-CM | POA: Diagnosis not present

## 2020-09-26 DIAGNOSIS — Z7901 Long term (current) use of anticoagulants: Secondary | ICD-10-CM | POA: Diagnosis not present

## 2020-09-26 DIAGNOSIS — Z96651 Presence of right artificial knee joint: Secondary | ICD-10-CM | POA: Diagnosis not present

## 2020-09-26 DIAGNOSIS — K219 Gastro-esophageal reflux disease without esophagitis: Secondary | ICD-10-CM | POA: Diagnosis not present

## 2020-09-26 DIAGNOSIS — C7A090 Malignant carcinoid tumor of the bronchus and lung: Secondary | ICD-10-CM | POA: Diagnosis not present

## 2020-09-26 DIAGNOSIS — G629 Polyneuropathy, unspecified: Secondary | ICD-10-CM | POA: Diagnosis not present

## 2020-09-26 DIAGNOSIS — M8580 Other specified disorders of bone density and structure, unspecified site: Secondary | ICD-10-CM | POA: Diagnosis not present

## 2020-09-26 DIAGNOSIS — Z471 Aftercare following joint replacement surgery: Secondary | ICD-10-CM | POA: Diagnosis not present

## 2020-09-26 DIAGNOSIS — C7B02 Secondary carcinoid tumors of liver: Secondary | ICD-10-CM | POA: Diagnosis not present

## 2020-09-28 DIAGNOSIS — C7A090 Malignant carcinoid tumor of the bronchus and lung: Secondary | ICD-10-CM | POA: Diagnosis not present

## 2020-09-28 DIAGNOSIS — M8580 Other specified disorders of bone density and structure, unspecified site: Secondary | ICD-10-CM | POA: Diagnosis not present

## 2020-09-28 DIAGNOSIS — Z79891 Long term (current) use of opiate analgesic: Secondary | ICD-10-CM | POA: Diagnosis not present

## 2020-09-28 DIAGNOSIS — Z7901 Long term (current) use of anticoagulants: Secondary | ICD-10-CM | POA: Diagnosis not present

## 2020-09-28 DIAGNOSIS — G629 Polyneuropathy, unspecified: Secondary | ICD-10-CM | POA: Diagnosis not present

## 2020-09-28 DIAGNOSIS — C7B02 Secondary carcinoid tumors of liver: Secondary | ICD-10-CM | POA: Diagnosis not present

## 2020-09-28 DIAGNOSIS — Z96651 Presence of right artificial knee joint: Secondary | ICD-10-CM | POA: Diagnosis not present

## 2020-09-28 DIAGNOSIS — D638 Anemia in other chronic diseases classified elsewhere: Secondary | ICD-10-CM | POA: Diagnosis not present

## 2020-09-28 DIAGNOSIS — K219 Gastro-esophageal reflux disease without esophagitis: Secondary | ICD-10-CM | POA: Diagnosis not present

## 2020-09-28 DIAGNOSIS — Z471 Aftercare following joint replacement surgery: Secondary | ICD-10-CM | POA: Diagnosis not present

## 2020-09-30 DIAGNOSIS — Z471 Aftercare following joint replacement surgery: Secondary | ICD-10-CM | POA: Diagnosis not present

## 2020-09-30 DIAGNOSIS — Z96651 Presence of right artificial knee joint: Secondary | ICD-10-CM | POA: Diagnosis not present

## 2020-09-30 DIAGNOSIS — C7B02 Secondary carcinoid tumors of liver: Secondary | ICD-10-CM | POA: Diagnosis not present

## 2020-09-30 DIAGNOSIS — G629 Polyneuropathy, unspecified: Secondary | ICD-10-CM | POA: Diagnosis not present

## 2020-09-30 DIAGNOSIS — C7A090 Malignant carcinoid tumor of the bronchus and lung: Secondary | ICD-10-CM | POA: Diagnosis not present

## 2020-09-30 DIAGNOSIS — Z79891 Long term (current) use of opiate analgesic: Secondary | ICD-10-CM | POA: Diagnosis not present

## 2020-09-30 DIAGNOSIS — K219 Gastro-esophageal reflux disease without esophagitis: Secondary | ICD-10-CM | POA: Diagnosis not present

## 2020-09-30 DIAGNOSIS — D638 Anemia in other chronic diseases classified elsewhere: Secondary | ICD-10-CM | POA: Diagnosis not present

## 2020-09-30 DIAGNOSIS — M8580 Other specified disorders of bone density and structure, unspecified site: Secondary | ICD-10-CM | POA: Diagnosis not present

## 2020-09-30 DIAGNOSIS — Z7901 Long term (current) use of anticoagulants: Secondary | ICD-10-CM | POA: Diagnosis not present

## 2020-10-03 DIAGNOSIS — K219 Gastro-esophageal reflux disease without esophagitis: Secondary | ICD-10-CM | POA: Diagnosis not present

## 2020-10-03 DIAGNOSIS — Z7901 Long term (current) use of anticoagulants: Secondary | ICD-10-CM | POA: Diagnosis not present

## 2020-10-03 DIAGNOSIS — D638 Anemia in other chronic diseases classified elsewhere: Secondary | ICD-10-CM | POA: Diagnosis not present

## 2020-10-03 DIAGNOSIS — G629 Polyneuropathy, unspecified: Secondary | ICD-10-CM | POA: Diagnosis not present

## 2020-10-03 DIAGNOSIS — Z96651 Presence of right artificial knee joint: Secondary | ICD-10-CM | POA: Diagnosis not present

## 2020-10-03 DIAGNOSIS — C7A090 Malignant carcinoid tumor of the bronchus and lung: Secondary | ICD-10-CM | POA: Diagnosis not present

## 2020-10-03 DIAGNOSIS — M8580 Other specified disorders of bone density and structure, unspecified site: Secondary | ICD-10-CM | POA: Diagnosis not present

## 2020-10-03 DIAGNOSIS — Z79891 Long term (current) use of opiate analgesic: Secondary | ICD-10-CM | POA: Diagnosis not present

## 2020-10-03 DIAGNOSIS — Z471 Aftercare following joint replacement surgery: Secondary | ICD-10-CM | POA: Diagnosis not present

## 2020-10-03 DIAGNOSIS — C7B02 Secondary carcinoid tumors of liver: Secondary | ICD-10-CM | POA: Diagnosis not present

## 2020-10-06 DIAGNOSIS — C7B02 Secondary carcinoid tumors of liver: Secondary | ICD-10-CM | POA: Diagnosis not present

## 2020-10-06 DIAGNOSIS — K219 Gastro-esophageal reflux disease without esophagitis: Secondary | ICD-10-CM | POA: Diagnosis not present

## 2020-10-06 DIAGNOSIS — Z79891 Long term (current) use of opiate analgesic: Secondary | ICD-10-CM | POA: Diagnosis not present

## 2020-10-06 DIAGNOSIS — Z96651 Presence of right artificial knee joint: Secondary | ICD-10-CM | POA: Diagnosis not present

## 2020-10-06 DIAGNOSIS — G629 Polyneuropathy, unspecified: Secondary | ICD-10-CM | POA: Diagnosis not present

## 2020-10-06 DIAGNOSIS — Z7901 Long term (current) use of anticoagulants: Secondary | ICD-10-CM | POA: Diagnosis not present

## 2020-10-06 DIAGNOSIS — Z471 Aftercare following joint replacement surgery: Secondary | ICD-10-CM | POA: Diagnosis not present

## 2020-10-06 DIAGNOSIS — M8580 Other specified disorders of bone density and structure, unspecified site: Secondary | ICD-10-CM | POA: Diagnosis not present

## 2020-10-06 DIAGNOSIS — D638 Anemia in other chronic diseases classified elsewhere: Secondary | ICD-10-CM | POA: Diagnosis not present

## 2020-10-06 DIAGNOSIS — C7A090 Malignant carcinoid tumor of the bronchus and lung: Secondary | ICD-10-CM | POA: Diagnosis not present

## 2020-10-07 DIAGNOSIS — Z96652 Presence of left artificial knee joint: Secondary | ICD-10-CM | POA: Diagnosis not present

## 2020-10-07 DIAGNOSIS — M2170 Unequal limb length (acquired), unspecified site: Secondary | ICD-10-CM | POA: Diagnosis not present

## 2020-10-07 DIAGNOSIS — M25462 Effusion, left knee: Secondary | ICD-10-CM | POA: Diagnosis not present

## 2020-10-10 ENCOUNTER — Telehealth: Payer: Self-pay | Admitting: Internal Medicine

## 2020-10-10 NOTE — Telephone Encounter (Signed)
Copied from [place] [id number]. Topic: Medicare AWV >> Oct 10, 2020 11:44 AM Cher Nakai R wrote: Reason for CRM:  Left message for patient to call back and schedule Medicare Annual Wellness Visit (AWV) in office.   If unable to come into the office for AWV,  please offer to do virtually or by telephone.  Last AWV: 07/15/2019  Please schedule at anytime with [facility] Health Advisor.  40 minute appointment  Any questions, please contact me at [phone]

## 2020-10-11 DIAGNOSIS — Z96651 Presence of right artificial knee joint: Secondary | ICD-10-CM | POA: Diagnosis not present

## 2020-10-11 DIAGNOSIS — M8580 Other specified disorders of bone density and structure, unspecified site: Secondary | ICD-10-CM | POA: Diagnosis not present

## 2020-10-11 DIAGNOSIS — Z7901 Long term (current) use of anticoagulants: Secondary | ICD-10-CM | POA: Diagnosis not present

## 2020-10-11 DIAGNOSIS — C7B02 Secondary carcinoid tumors of liver: Secondary | ICD-10-CM | POA: Diagnosis not present

## 2020-10-11 DIAGNOSIS — Z79891 Long term (current) use of opiate analgesic: Secondary | ICD-10-CM | POA: Diagnosis not present

## 2020-10-11 DIAGNOSIS — K219 Gastro-esophageal reflux disease without esophagitis: Secondary | ICD-10-CM | POA: Diagnosis not present

## 2020-10-11 DIAGNOSIS — Z471 Aftercare following joint replacement surgery: Secondary | ICD-10-CM | POA: Diagnosis not present

## 2020-10-11 DIAGNOSIS — G629 Polyneuropathy, unspecified: Secondary | ICD-10-CM | POA: Diagnosis not present

## 2020-10-11 DIAGNOSIS — D638 Anemia in other chronic diseases classified elsewhere: Secondary | ICD-10-CM | POA: Diagnosis not present

## 2020-10-11 DIAGNOSIS — C7A090 Malignant carcinoid tumor of the bronchus and lung: Secondary | ICD-10-CM | POA: Diagnosis not present

## 2020-10-18 DIAGNOSIS — M6281 Muscle weakness (generalized): Secondary | ICD-10-CM | POA: Diagnosis not present

## 2020-10-18 DIAGNOSIS — Z96652 Presence of left artificial knee joint: Secondary | ICD-10-CM | POA: Diagnosis not present

## 2020-10-18 DIAGNOSIS — M25562 Pain in left knee: Secondary | ICD-10-CM | POA: Diagnosis not present

## 2020-10-18 DIAGNOSIS — M25662 Stiffness of left knee, not elsewhere classified: Secondary | ICD-10-CM | POA: Diagnosis not present

## 2020-10-19 DIAGNOSIS — M25562 Pain in left knee: Secondary | ICD-10-CM | POA: Diagnosis not present

## 2020-10-19 DIAGNOSIS — Z96652 Presence of left artificial knee joint: Secondary | ICD-10-CM | POA: Diagnosis not present

## 2020-10-19 DIAGNOSIS — M6281 Muscle weakness (generalized): Secondary | ICD-10-CM | POA: Diagnosis not present

## 2020-10-19 DIAGNOSIS — M25662 Stiffness of left knee, not elsewhere classified: Secondary | ICD-10-CM | POA: Diagnosis not present

## 2020-10-24 DIAGNOSIS — Z96652 Presence of left artificial knee joint: Secondary | ICD-10-CM | POA: Diagnosis not present

## 2020-10-24 DIAGNOSIS — M25562 Pain in left knee: Secondary | ICD-10-CM | POA: Diagnosis not present

## 2020-10-24 DIAGNOSIS — M6281 Muscle weakness (generalized): Secondary | ICD-10-CM | POA: Diagnosis not present

## 2020-10-24 DIAGNOSIS — M25662 Stiffness of left knee, not elsewhere classified: Secondary | ICD-10-CM | POA: Diagnosis not present

## 2020-10-26 DIAGNOSIS — M25562 Pain in left knee: Secondary | ICD-10-CM | POA: Diagnosis not present

## 2020-10-26 DIAGNOSIS — M25662 Stiffness of left knee, not elsewhere classified: Secondary | ICD-10-CM | POA: Diagnosis not present

## 2020-10-26 DIAGNOSIS — M6281 Muscle weakness (generalized): Secondary | ICD-10-CM | POA: Diagnosis not present

## 2020-10-26 DIAGNOSIS — Z96652 Presence of left artificial knee joint: Secondary | ICD-10-CM | POA: Diagnosis not present

## 2020-10-28 DIAGNOSIS — M25662 Stiffness of left knee, not elsewhere classified: Secondary | ICD-10-CM | POA: Diagnosis not present

## 2020-10-28 DIAGNOSIS — M25562 Pain in left knee: Secondary | ICD-10-CM | POA: Diagnosis not present

## 2020-10-28 DIAGNOSIS — Z96652 Presence of left artificial knee joint: Secondary | ICD-10-CM | POA: Diagnosis not present

## 2020-10-28 DIAGNOSIS — M6281 Muscle weakness (generalized): Secondary | ICD-10-CM | POA: Diagnosis not present

## 2020-11-01 DIAGNOSIS — M25662 Stiffness of left knee, not elsewhere classified: Secondary | ICD-10-CM | POA: Diagnosis not present

## 2020-11-01 DIAGNOSIS — M25562 Pain in left knee: Secondary | ICD-10-CM | POA: Diagnosis not present

## 2020-11-01 DIAGNOSIS — M6281 Muscle weakness (generalized): Secondary | ICD-10-CM | POA: Diagnosis not present

## 2020-11-01 DIAGNOSIS — Z96652 Presence of left artificial knee joint: Secondary | ICD-10-CM | POA: Diagnosis not present

## 2020-11-03 DIAGNOSIS — M25562 Pain in left knee: Secondary | ICD-10-CM | POA: Diagnosis not present

## 2020-11-03 DIAGNOSIS — M6281 Muscle weakness (generalized): Secondary | ICD-10-CM | POA: Diagnosis not present

## 2020-11-03 DIAGNOSIS — Z96652 Presence of left artificial knee joint: Secondary | ICD-10-CM | POA: Diagnosis not present

## 2020-11-03 DIAGNOSIS — M25662 Stiffness of left knee, not elsewhere classified: Secondary | ICD-10-CM | POA: Diagnosis not present

## 2020-11-07 DIAGNOSIS — M25662 Stiffness of left knee, not elsewhere classified: Secondary | ICD-10-CM | POA: Diagnosis not present

## 2020-11-07 DIAGNOSIS — M6281 Muscle weakness (generalized): Secondary | ICD-10-CM | POA: Diagnosis not present

## 2020-11-07 DIAGNOSIS — Z96652 Presence of left artificial knee joint: Secondary | ICD-10-CM | POA: Diagnosis not present

## 2020-11-07 DIAGNOSIS — M25562 Pain in left knee: Secondary | ICD-10-CM | POA: Diagnosis not present

## 2020-11-08 DIAGNOSIS — Z96652 Presence of left artificial knee joint: Secondary | ICD-10-CM | POA: Insufficient documentation

## 2020-11-08 HISTORY — PX: TOTAL KNEE ARTHROPLASTY: SHX125

## 2020-11-10 DIAGNOSIS — Z96652 Presence of left artificial knee joint: Secondary | ICD-10-CM | POA: Diagnosis not present

## 2020-11-10 DIAGNOSIS — M25562 Pain in left knee: Secondary | ICD-10-CM | POA: Diagnosis not present

## 2020-11-10 DIAGNOSIS — M6281 Muscle weakness (generalized): Secondary | ICD-10-CM | POA: Diagnosis not present

## 2020-11-10 DIAGNOSIS — M25662 Stiffness of left knee, not elsewhere classified: Secondary | ICD-10-CM | POA: Diagnosis not present

## 2020-11-12 ENCOUNTER — Ambulatory Visit (INDEPENDENT_AMBULATORY_CARE_PROVIDER_SITE_OTHER): Payer: Medicare HMO | Admitting: *Deleted

## 2020-11-12 ENCOUNTER — Other Ambulatory Visit: Payer: Self-pay

## 2020-11-12 DIAGNOSIS — Z0000 Encounter for general adult medical examination without abnormal findings: Secondary | ICD-10-CM

## 2020-11-12 NOTE — Progress Notes (Addendum)
Subjective:   Alexandra Watson is a 75 y.o. female who presents for Medicare Annual (Subsequent) preventive examination.  I connected with  [name] on 11/12/20 by a Audio enabled telemedicine application and verified that I am speaking with the correct person using two identifiers.   I discussed the limitations of evaluation and management by telemedicine. The patient expressed understanding and agreed to proceed.   Location of Patient: Home Location of Staff: Office  People attending Visit: Amiera S.Brandi; Emilio Aspen, RMA    Objective:    Today's Vitals   11/12/20 1451  PainSc: 0-No pain   There is no height or weight on file to calculate BMI.  Advanced Directives 11/12/2020 07/15/2019 07/14/2018 07/10/2017 07/06/2016 08/07/2015  Does Patient Have a Medical Advance Directive? Yes Yes Yes Yes Yes Yes  Type of Paramedic of [place];Living will [place];Living will Living will;Healthcare Power of [place];Living will Living will [place]  Does patient want to make changes to medical advance directive? No - Patient declined Yes (MAU/Ambulatory/Procedural Areas - Information given) - - - -  Copy of [place] in Chart? No - copy requested - No - copy requested No - copy requested - -    Current Medications (verified) Outpatient Encounter Medications as of 11/12/2020  Medication Sig   Misc Natural Products (TURMERIC CURCUMIN) CAPS Take 1 capsule by mouth daily. 1000 mg   Omega-3 Fatty Acids (FISH OIL) 1000 MG CAPS Take by mouth.   psyllium (METAMUCIL) 58.6 % powder Take 1 packet by mouth as needed.   simvastatin (ZOCOR) 20 MG tablet TAKE 1 TABLET BY MOUTH EVERY DAY   zinc gluconate 50 MG tablet Take 50 mg by mouth daily. 2-3 times per week   Ascorbic Acid (VITAMIN C WITH ROSE HIPS) 500 MG tablet Take 500 mg by mouth daily.   buPROPion (WELLBUTRIN XL) 150 MG 24  hr tablet Take 1 tablet (150 mg total) by mouth daily.   Calcium Carbonate-Vit D-Min (CALCIUM 1200 PO) Take 1,200 mg by mouth.   CHOLECALCIFEROL PO Take 1 tablet by mouth daily. 5000 iu   EPINEPHrine 0.3 mg/0.3 mL IJ SOAJ injection INJECT 0.3 MLS (0.3 MG TOTAL) INTO THE MUSCLE AS NEEDED (ANAPHYLAXIS). FOR UP TO 1 DOSE   hydrochlorothiazide (HYDRODIURIL) 25 MG tablet TAKE 1 TABLET BY MOUTH EVERY DAY   Melatonin 10 MG TABS Take by mouth at bedtime as needed.   metoprolol tartrate (LOPRESSOR) 25 MG tablet Take 1 tablet (25 mg total) by mouth daily.   No facility-administered encounter medications on file as of 11/12/2020.    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   Past Surgical History:  Procedure Laterality Date   ANKLE ARTHROSCOPY WITH OPEN REDUCTION INTERNAL FIXATION (ORIF)  07/2015   tri-malleolar   BUNIONECTOMY Bilateral 2001, 2005   CHOLECYSTECTOMY     COLONOSCOPY  05/2010   normal   GANGLION CYST EXCISION Left    THORACOTOMY / DECORTICATION PARIETAL PLEURA Left 06/2017   [facility]   VATS Sleeve lobectomy Left 09/2011   typical variant carcinoid   Family History  Problem Relation Age  of Onset   Stroke Mother    Heart disease Mother    Heart attack Father    Heart disease Father    CAD Brother    Heart disease Brother    Heart attack Brother    Heart disease Brother    Breast cancer Neg Hx    Social History   Socioeconomic History   Marital status: Married    Spouse name: Najah Liverman   Number of children: 1   Years of education: some college   Highest education level: 12th grade  Occupational History   Occupation: Retired  Tobacco Use   Smoking status: Former     Packs/day: 0.25    Years: 20.00    Pack years: 5.00    Types: Cigarettes    Quit date: 1985    Years since quitting: 37.7   Smokeless tobacco: Never   Tobacco comments:    smoking cessation materials not required  Vaping Use   Vaping Use: Never used  Substance and Sexual Activity   Alcohol use: Not Currently    Alcohol/week: 1.0 standard drink    Types: 1 Glasses of wine per week    Comment: stopped drinking wine 2 years   Drug use: No   Sexual activity: Not Currently  Other Topics Concern   Not on file  Social History Narrative   Not on file   Social Determinants of Health   Financial Resource Strain: Low Risk    Difficulty of Paying Living Expenses: Not hard at all  Food Insecurity: Not on file  Transportation Needs: No Transportation Needs   Lack of Transportation (Medical): No   Lack of Transportation (Non-Medical): No  Physical Activity: Insufficiently Active   Days of Exercise per Week: 3 days   Minutes of Exercise per Session: 20 min  Stress: Stress Concern Present   Feeling of Stress : To some extent  Social Connections: Engineer, building services of Communication with Friends and Family: More than three times a week   Frequency of Social Gatherings with Friends and Family: Twice a week   Attends Religious Services: More than 4 times per year   Active Member of [employer] or Organizations: Yes   Attends Music therapist: More than 4 times per year   Marital Status: Married    Tobacco Counseling Counseling given: Yes Tobacco comments: smoking cessation materials not required   Clinical Intake:   Pain : 0-10 Pain Score: 0-No pain   Nutritional Risks: None Diabetes: No  How often do you need to have someone help you when you read instructions, pamphlets, or other written materials from your doctor or pharmacy?: 1 - Never What is the last grade level you completed in school?: Some College  Diabetic?No   Activities of Daily Living In  your present state of health, do you have any difficulty performing the following activities: 07/22/2020 07/22/2020  Hearing? N N  Vision? N N  Difficulty concentrating or making decisions? N N  Walking or climbing stairs? Y N  Dressing or bathing? N N  Doing errands, shopping? N N  Some recent data might be hidden    Patient Care Team: Glean Hess, MD as PCP - General (Internal Medicine) Lerry Paterson, MD as Referring Physician (Surgical Oncology) Dr. Geryl Councilman (Ophthalmology) Manya Silvas, MD (Inactive) (Gastroenterology) Arna Medici, MD as Referring Physician (Allergy and Immunology) Nat Math, MD as Referring Physician (Oncology)  Indicate any recent Medical Services you may have received  from other than [facility] providers in the past year (date may be approximate).     Assessment:   This is a routine wellness examination for [place].  Hearing/Vision screen Patient states she had her vision checked December 2021 [facility]  Dietary issues and exercise activities discussed: Current Exercise Habits: Home exercise routine, Type of exercise: walking, Time (Minutes): 60, Frequency (Times/Week): 7, Weekly Exercise (Minutes/Week): 420, Intensity: Moderate   Goals Addressed   None   Depression Screen PHQ 2/9 Scores 11/12/2020 07/22/2020 01/12/2020 10/21/2019 07/15/2019 06/17/2019 05/05/2019  PHQ - 2 Score 0 0 0 2 0 4 2  PHQ- 9 Score - 0 0 2 2 6 4     Fall Risk Fall Risk  11/12/2020 07/22/2020 01/12/2020 10/21/2019 07/15/2019  Falls in the past year? 1 0 0 0 0  Comment - - - - -  Number falls in past yr: 0 0 0 0 0  Injury with Fall? 0 0 0 0 0  Comment - - - - -  Risk Factor Category  - - - - -  Risk for fall due to : No Fall Risks - No Fall Risks No Fall Risks No Fall Risks  Risk for fall due to: Comment - - - - -  Follow up Falls evaluation completed Falls evaluation completed Falls evaluation completed Falls evaluation completed Falls prevention  discussed    FALL RISK PREVENTION PERTAINING TO THE HOME:  Any stairs in or around the home? Yes  If so, are there any without handrails? Yes  Home free of loose throw rugs in walkways, pet beds, electrical cords, etc? No  Adequate lighting in your home to reduce risk of falls? Yes   ASSISTIVE DEVICES UTILIZED TO PREVENT FALLS:  Life alert? No  Use of a cane, walker or w/c? No  Grab bars in the bathroom? Yes  Shower chair or bench in shower? No  Elevated toilet seat or a handicapped toilet? No   Cognitive Function:   6CIT Screen 11/12/2020 07/14/2018 07/10/2017 07/06/2016  What Year? 0 points 0 points 0 points 0 points  What month? 0 points 0 points 0 points 0 points  What time? 0 points 0 points 0 points 0 points  Count back from 20 0 points 0 points 0 points 0 points  Months in reverse 0 points 0 points 0 points 0 points  Repeat phrase - 0 points 0 points 0 points  Total Score - 0 0 0    Immunizations Immunization History  Administered Date(s) Administered   Fluad Quad(high Dose 65+) 12/11/2018   Influenza, High Dose Seasonal PF 11/25/2014, 11/24/2015, 11/22/2016, 12/17/2017   Influenza-Unspecified 11/30/2019   PFIZER(Purple Top)SARS-COV-2 Vaccination 04/18/2019, 05/10/2019, 01/02/2020   Pneumococcal Conjugate-13 02/02/2013   Pneumococcal Polysaccharide-23 11/16/2010, 02/03/2014   Tdap 06/10/2009   Zoster Recombinat (Shingrix) 01/25/2018, 10/25/2018   Zoster, Live 01/25/2018    TDAP status: Due, Education has been provided regarding the importance of this vaccine. Advised may receive this vaccine at local pharmacy or Health Dept. Aware to provide a copy of the vaccination record if obtained from local pharmacy or Health Dept. Verbalized acceptance and understanding.   Flu Vaccine status: Due, Education has been provided regarding the importance of this vaccine. Advised may receive this vaccine at local pharmacy or Health Dept. Aware to provide a copy of the vaccination  record if obtained from local pharmacy or Health Dept. Verbalized acceptance and understanding.  Pneumococcal vaccine status: Up to date Patient stated she completed [facility] at  [facility] [place].  Covid-19 vaccine status: Information provided on how to obtain vaccines.   Qualifies for Shingles Vaccine? Yes   Zostavax completed Yes   Shingrix Completed?: Yes Had Shingrix was completed about 1 year ago 2021 [facility] [address] [place].  Screening Tests Health Maintenance  Topic Date Due   TETANUS/TDAP  06/11/2019   COVID-19 Vaccine (4 - Booster for Pfizer series) 03/26/2020   INFLUENZA VACCINE  09/26/2020   MAMMOGRAM  02/16/2021   DEXA SCAN  Completed   Zoster Vaccines- Shingrix  Completed   Hepatitis C Screening  Addressed   HPV VACCINES  Aged Out    Health Maintenance  Health Maintenance Due  Topic Date Due   TETANUS/TDAP  06/11/2019   COVID-19 Vaccine (4 - Booster for Pfizer series) 03/26/2020   INFLUENZA VACCINE  09/26/2020    Colorectal cancer screening: Type of screening: Colonoscopy. Completed 06/06/2019. Repeat every 10 yearsColonoscopy about 2 years ago and return in 10 years  Mammogram status: February 17, 2020 return in 1 year  Bone Density status: Completed 06/24/2017. Results reflect: Bone density results: NORMAL. Repeat every 10 years.  Lung Cancer Screening: (Low Dose CT Chest recommended if Age 15-80 years, 30 pack-year currently smoking OR have quit w/in 15years.) does qualify.    Additional Screening:  Hepatitis C Screening: does qualify; Completed 02/03/2014  Vision Screening: Recommended annual ophthalmology exams for early detection of glaucoma and other disorders of the eye. Is the patient up to date with their annual eye exam?  Yes  Who is the provider or what is the name of the office in which the patient attends annual eye exams? December 2021 done at [facility]  If pt is not established with a provider, would they like to be referred to a provider to  establish care? No .   Dental Screening: Recommended annual dental exams for proper oral hygiene  Community Resource Referral / Chronic Care Management: CRR required this visit?  No   CCM required this visit?  No      Plan:     I have personally reviewed and noted the following in the patient's chart:   Medical and social history Use of alcohol, tobacco or illicit drugs  Current medications and supplements including opioid prescriptions.  Functional ability and status Nutritional status Physical activity Advanced directives List of other physicians Hospitalizations, surgeries, and ER visits in previous 12 months Vitals Screenings to include cognitive, depression, and falls Referrals and appointments  In addition, I have reviewed and discussed with patient certain preventive protocols, quality metrics, and best practice recommendations. A written personalized care plan for preventive services as well as general preventive health recommendations were provided to patient.     Emilio Aspen [place], [place]   [id number]   Nurse Notes: None face to face, 81mins  Patient visit completed via telephone. Patient verbalized understanding during the entire visit. Staff informed patient to please call PCP office to complete vaccines and screenings that are needed. Patient verbalized understanding and agreed.   Per pt she had knee replacement left July 27th 2022 and she is doing well.  Colonoscopy was done several years ago, return in 10 years   I have reviewed the health advisor's note, was available for consultation, and agree with documentation and plan.  Halina Maidens, MD

## 2020-11-16 DIAGNOSIS — Z96652 Presence of left artificial knee joint: Secondary | ICD-10-CM | POA: Diagnosis not present

## 2020-11-16 DIAGNOSIS — M25662 Stiffness of left knee, not elsewhere classified: Secondary | ICD-10-CM | POA: Diagnosis not present

## 2020-11-16 DIAGNOSIS — M6281 Muscle weakness (generalized): Secondary | ICD-10-CM | POA: Diagnosis not present

## 2020-11-16 DIAGNOSIS — M25562 Pain in left knee: Secondary | ICD-10-CM | POA: Diagnosis not present

## 2020-11-18 ENCOUNTER — Telehealth: Payer: Self-pay

## 2020-11-18 NOTE — Progress Notes (Signed)
  Left message for patient to call back and schedule Medicare Annual Wellness Visit (AWV) on  Saturday Sept 24,2022 to do by phone or video 8-12  Last AWV  07/15/2019 Please schedule with [facility] Health Advisor.      2 Minutes appointment   Any questions, please call me at [place], [place], [place] [facility]  [place], [place] [address] Direct Dial: [phone] [email or web address]: [email or web address]

## 2020-11-23 DIAGNOSIS — M25662 Stiffness of left knee, not elsewhere classified: Secondary | ICD-10-CM | POA: Diagnosis not present

## 2020-11-23 DIAGNOSIS — M25562 Pain in left knee: Secondary | ICD-10-CM | POA: Diagnosis not present

## 2020-11-23 DIAGNOSIS — Z96652 Presence of left artificial knee joint: Secondary | ICD-10-CM | POA: Diagnosis not present

## 2020-11-23 DIAGNOSIS — M6281 Muscle weakness (generalized): Secondary | ICD-10-CM | POA: Diagnosis not present

## 2020-12-26 ENCOUNTER — Encounter: Payer: Self-pay | Admitting: Internal Medicine

## 2020-12-27 DIAGNOSIS — H26051 Posterior subcapsular polar infantile and juvenile cataract, right eye: Secondary | ICD-10-CM | POA: Diagnosis not present

## 2020-12-29 DIAGNOSIS — C787 Secondary malignant neoplasm of liver and intrahepatic bile duct: Secondary | ICD-10-CM | POA: Diagnosis not present

## 2020-12-29 DIAGNOSIS — Z79899 Other long term (current) drug therapy: Secondary | ICD-10-CM | POA: Diagnosis not present

## 2020-12-29 DIAGNOSIS — Z902 Acquired absence of lung [part of]: Secondary | ICD-10-CM | POA: Diagnosis not present

## 2020-12-29 DIAGNOSIS — Z85828 Personal history of other malignant neoplasm of skin: Secondary | ICD-10-CM | POA: Diagnosis not present

## 2020-12-29 DIAGNOSIS — C3490 Malignant neoplasm of unspecified part of unspecified bronchus or lung: Secondary | ICD-10-CM | POA: Diagnosis not present

## 2020-12-29 DIAGNOSIS — C782 Secondary malignant neoplasm of pleura: Secondary | ICD-10-CM | POA: Diagnosis not present

## 2020-12-29 DIAGNOSIS — K7689 Other specified diseases of liver: Secondary | ICD-10-CM | POA: Diagnosis not present

## 2020-12-29 DIAGNOSIS — K769 Liver disease, unspecified: Secondary | ICD-10-CM | POA: Diagnosis not present

## 2020-12-29 DIAGNOSIS — R1900 Intra-abdominal and pelvic swelling, mass and lump, unspecified site: Secondary | ICD-10-CM | POA: Diagnosis not present

## 2020-12-29 DIAGNOSIS — R69 Illness, unspecified: Secondary | ICD-10-CM | POA: Diagnosis not present

## 2021-01-08 ENCOUNTER — Other Ambulatory Visit: Payer: Self-pay | Admitting: Internal Medicine

## 2021-01-08 DIAGNOSIS — E782 Mixed hyperlipidemia: Secondary | ICD-10-CM

## 2021-01-08 NOTE — Telephone Encounter (Signed)
Requested medication (s) are due for refill today: Yes  Requested medication (s) are on the active medication list: yes  Last refill:  07/10/20 #90/1RF  Future visit scheduled: Yes  Notes to clinic:  Unable to refill per protocol due to failed labs, no updated results.      Requested Prescriptions  Pending Prescriptions Disp Refills   simvastatin (ZOCOR) 20 MG tablet [Pharmacy Med Name: SIMVASTATIN 20 MG TABLET] 90 tablet 1    Sig: TAKE 1 TABLET BY MOUTH EVERY DAY     Cardiovascular:  Antilipid - Statins Failed - 01/08/2021  9:41 AM      Failed - Total Cholesterol in normal range and within 360 days    Cholesterol, Total  Date Value Ref Range Status  01/12/2020 139 100 - 199 mg/dL Final          Failed - LDL in normal range and within 360 days    LDL Chol Calc (NIH)  Date Value Ref Range Status  01/12/2020 67 0 - 99 mg/dL Final          Failed - HDL in normal range and within 360 days    HDL  Date Value Ref Range Status  01/12/2020 54 >39 mg/dL Final          Failed - Triglycerides in normal range and within 360 days    Triglycerides  Date Value Ref Range Status  01/12/2020 99 0 - 149 mg/dL Final          Passed - Patient is not pregnant      Passed - Valid encounter within last 12 months    Recent Outpatient Visits           5 months ago Arthritis of knee, left   [facility] Glean Hess, MD   12 months ago Essential hypertension   [place], Laura H, MD   1 year ago Essential hypertension   [facility] Glean Hess, MD   1 year ago Generalized anxiety disorder   [facility] Glean Hess, MD   1 year ago Generalized anxiety disorder   [facility] Glean Hess, MD       Future Appointments             In 2 days Glean Hess, MD [facility], [facility]

## 2021-01-10 ENCOUNTER — Ambulatory Visit (INDEPENDENT_AMBULATORY_CARE_PROVIDER_SITE_OTHER): Payer: Medicare HMO | Admitting: Internal Medicine

## 2021-01-10 ENCOUNTER — Encounter: Payer: Self-pay | Admitting: Internal Medicine

## 2021-01-10 ENCOUNTER — Other Ambulatory Visit: Payer: Self-pay

## 2021-01-10 VITALS — BP 132/74 | HR 69 | Temp 98.2°F | Ht 64.0 in | Wt 153.0 lb

## 2021-01-10 DIAGNOSIS — I10 Essential (primary) hypertension: Secondary | ICD-10-CM | POA: Diagnosis not present

## 2021-01-10 DIAGNOSIS — E782 Mixed hyperlipidemia: Secondary | ICD-10-CM

## 2021-01-10 DIAGNOSIS — G250 Essential tremor: Secondary | ICD-10-CM | POA: Diagnosis not present

## 2021-01-10 DIAGNOSIS — Z91018 Allergy to other foods: Secondary | ICD-10-CM | POA: Diagnosis not present

## 2021-01-10 DIAGNOSIS — R69 Illness, unspecified: Secondary | ICD-10-CM | POA: Diagnosis not present

## 2021-01-10 DIAGNOSIS — F411 Generalized anxiety disorder: Secondary | ICD-10-CM | POA: Diagnosis not present

## 2021-01-10 DIAGNOSIS — H25813 Combined forms of age-related cataract, bilateral: Secondary | ICD-10-CM | POA: Diagnosis not present

## 2021-01-10 MED ORDER — BUPROPION HCL ER (XL) 150 MG PO TB24
150.0000 mg | ORAL_TABLET | Freq: Every day | ORAL | 1 refills | Status: DC
Start: 2021-01-10 — End: 2021-03-13

## 2021-01-10 MED ORDER — HYDROCHLOROTHIAZIDE 25 MG PO TABS: 25.0000 mg | ORAL_TABLET | Freq: Every day | ORAL | 1 refills | Status: DC

## 2021-01-10 MED ORDER — PRIMIDONE 50 MG PO TABS: 50.0000 mg | ORAL_TABLET | Freq: Three times a day (TID) | ORAL | 1 refills | Status: DC

## 2021-01-10 MED ORDER — METOPROLOL TARTRATE 25 MG PO TABS: 25.0000 mg | ORAL_TABLET | Freq: Every day | ORAL | 1 refills | Status: DC

## 2021-01-10 NOTE — Progress Notes (Signed)
Date:  01/10/2021   Name:  Alexandra Watson   DOB:  Dec 22, 1945   MRN:  [id number]   Chief Complaint: Hypertension, Hyperlipidemia, and Depression Recent cancer survey showed decrease in the size of her liver lesion s/p immunotherapy.  Hypertension This is a chronic problem. Associated symptoms include anxiety. Pertinent negatives include no chest pain, headaches, palpitations or shortness of breath. Past treatments include diuretics and beta blockers. The current treatment provides significant improvement. There are no compliance problems.  There is no history of kidney disease, CAD/MI or CVA.  Hyperlipidemia This is a chronic problem. The problem is controlled. Pertinent negatives include no chest pain or shortness of breath. Current antihyperlipidemic treatment includes statins. The current treatment provides significant improvement of lipids.  Anxiety Presents for follow-up visit. Symptoms include nervous/anxious behavior. Patient reports no chest pain, dizziness, palpitations or shortness of breath. Symptoms occur occasionally. The quality of sleep is good.   Compliance with medications is 76-100% (bupropion).  Tremor - BET noted about 2 years ago.  Seen by Dr. Manuella Ghazi. Tried gabapentin but stopped it when it did not help.  The tremor is about the same - has mostly trouble writing and shaky voice.  No falls, no difficulty eating or drinking.  Lab Results  Component Value Date   CREATININE 0.74 01/12/2020   BUN 12 01/12/2020   NA 139 01/12/2020   K 4.4 01/12/2020   CL 98 01/12/2020   CO2 27 01/12/2020   Lab Results  Component Value Date   CHOL 139 01/12/2020   HDL 54 01/12/2020   LDLCALC 67 01/12/2020   TRIG 99 01/12/2020   CHOLHDL 2.6 01/12/2020   Lab Results  Component Value Date   TSH 1.300 01/12/2019   No results found for: HGBA1C Lab Results  Component Value Date   WBC 4.2 01/12/2019   HGB 12.0 01/12/2019   HCT 36.0 01/12/2019   MCV 87 01/12/2019   PLT 246  01/12/2019   Lab Results  Component Value Date   ALT 36 (H) 01/12/2020   AST 28 01/12/2020   ALKPHOS 101 01/12/2020   BILITOT 0.3 01/12/2020     Review of Systems  Constitutional:  Negative for fatigue and unexpected weight change.  HENT:  Negative for nosebleeds and trouble swallowing (voice is shaky).   Eyes:  Negative for visual disturbance.  Respiratory:  Negative for cough, chest tightness, shortness of breath and wheezing.   Cardiovascular:  Negative for chest pain, palpitations and leg swelling.  Gastrointestinal:  Positive for constipation. Negative for abdominal pain and diarrhea.  Genitourinary:  Negative for difficulty urinating, dysuria and hematuria.  Musculoskeletal:  Positive for arthralgias (recent left total knee).  Skin:  Negative for color change and wound.  Neurological:  Positive for tremors. Negative for dizziness, weakness, light-headedness and headaches.  Psychiatric/Behavioral:  Negative for dysphoric mood and sleep disturbance. The patient is nervous/anxious.    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   Primary osteoarthritis of left knee 09/17/2018   Generalized anxiety disorder 11/02/2017   Weight loss, abnormal 10/11/2017   Neuropathy 07/06/2016   SUI (stress urinary incontinence, female) 11/11/2015   Incomplete emptying of bladder 11/11/2015   Recurrent UTI 11/10/2015   Arthritis of knee, left 07/01/2015   Insomnia 06/30/2015   Atrophic vaginitis 06/30/2015   Gastroesophageal reflux disease 06/30/2015   Osteopenia 05/20/2015  Anemia 09/14/2012   Constipation 08/27/2012   Essential hypertension 10/04/2011   Hyperlipidemia, mixed 10/04/2011    Allergies  Allergen Reactions   Galactose Anaphylaxis, Anxiety, Diarrhea, Itching and Shortness Of Breath   Iodinated Diagnostic Agents Hives   Oxycodone  Nausea And Vomiting and Nausea Only    Past Surgical History:  Procedure Laterality Date   ANKLE ARTHROSCOPY WITH OPEN REDUCTION INTERNAL FIXATION (ORIF)  07/2015   tri-malleolar   BUNIONECTOMY Bilateral 2001, 2005   CHOLECYSTECTOMY     COLONOSCOPY  05/2010   normal   GANGLION CYST EXCISION Left    THORACOTOMY / DECORTICATION PARIETAL PLEURA Left 06/2017   [facility]   VATS Sleeve lobectomy Left 09/2011   typical variant carcinoid    Social History   Tobacco Use   Smoking status: Former    Packs/day: 0.25    Years: 20.00    Pack years: 5.00    Types: Cigarettes    Quit date: 1985    Years since quitting: 37.8   Smokeless tobacco: Never   Tobacco comments:    smoking cessation materials not required  Vaping Use   Vaping Use: Never used  Substance Use Topics   Alcohol use: Not Currently    Alcohol/week: 1.0 standard drink    Types: 1 Glasses of wine per week    Comment: stopped drinking wine 2 years   Drug use: No     Medication list has been reviewed and updated.  Current Meds  Medication Sig   Ascorbic Acid (VITAMIN C WITH ROSE HIPS) 500 MG tablet Take 500 mg by mouth daily.   buPROPion (WELLBUTRIN XL) 150 MG 24 hr tablet Take 1 tablet (150 mg total) by mouth daily.   Calcium Carbonate-Vit D-Min (CALCIUM 1200 PO) Take 1,200 mg by mouth.   Calcium-Magnesium-Zinc 333-133-5 MG TABS Take by mouth.   CHOLECALCIFEROL PO Take 1 tablet by mouth daily. 5000 iu   Elderberry 500 MG CAPS Take by mouth.   EPINEPHrine 0.3 mg/0.3 mL IJ SOAJ injection INJECT 0.3 MLS (0.3 MG TOTAL) INTO THE MUSCLE AS NEEDED (ANAPHYLAXIS). FOR UP TO 1 DOSE   hydrochlorothiazide (HYDRODIURIL) 25 MG tablet TAKE 1 TABLET BY MOUTH EVERY DAY   Melatonin 10 MG TABS Take by mouth at bedtime as needed.   metoprolol tartrate (LOPRESSOR) 25 MG tablet Take 1 tablet (25 mg total) by mouth daily.   Misc Natural Products (TURMERIC CURCUMIN) CAPS Take 1 capsule by mouth daily. 1000 mg   Multiple Vitamin (MULTIVITAMIN  WITH MINERALS) TABS tablet Take 1 tablet by mouth daily.   Omega-3 Fatty Acids (FISH OIL) 1000 MG CAPS Take by mouth.   psyllium (METAMUCIL) 58.6 % powder Take 1 packet by mouth as needed.   simvastatin (ZOCOR) 20 MG tablet TAKE 1 TABLET BY MOUTH EVERY DAY   Turmeric 1053 MG TABS Take by mouth.   zinc gluconate 50 MG tablet Take 50 mg by mouth daily. 2-3 times per week    PHQ 2/9 Scores 01/10/2021 11/12/2020 07/22/2020 01/12/2020  PHQ - 2 Score 0 0 0 0  PHQ- 9 Score 0 - 0 0    GAD 7 : Generalized Anxiety Score 01/10/2021 07/22/2020 01/12/2020 06/17/2019  Nervous, Anxious, on Edge 0 0 0 2  Control/stop worrying 1 0 0 2  Worry too much - different things 0 0 0 2  Trouble relaxing 0 0 0 1  Restless 0 0 0 0  Easily annoyed or irritable 0 0 0 0  Afraid - awful might  happen 0 0 0 0  Total GAD 7 Score 1 0 0 7  Anxiety Difficulty Not difficult at all Not difficult at all Not difficult at all Not difficult at all    BP Readings from Last 3 Encounters:  01/10/21 132/74  07/22/20 132/70  01/12/20 (!) 142/72    Physical Exam Vitals and nursing note reviewed.  Constitutional:      General: She is not in acute distress.    Appearance: She is well-developed.  HENT:     Head: Normocephalic and atraumatic.  Cardiovascular:     Rate and Rhythm: Normal rate and regular rhythm.     Pulses: Normal pulses.     Heart sounds: No murmur heard. Pulmonary:     Effort: Pulmonary effort is normal. No respiratory distress.  Musculoskeletal:        General: No swelling or tenderness.     Cervical back: Normal range of motion.     Right lower leg: No edema.     Left lower leg: No edema.  Skin:    General: Skin is warm and dry.     Capillary Refill: Capillary refill takes less than 2 seconds.     Findings: No rash.  Neurological:     Mental Status: She is alert and oriented to person, place, and time.     Motor: Tremor (fine intention tremor both UE, mild tremor of the head) present. No weakness.      Coordination: Coordination is intact.     Gait: Gait normal.  Psychiatric:        Mood and Affect: Mood normal.        Behavior: Behavior normal.    Wt Readings from Last 3 Encounters:  01/10/21 153 lb (69.4 kg)  07/22/20 150 lb (68 kg)  01/12/20 147 lb (66.7 kg)    BP 132/74   Pulse 69   Temp 98.2 F (36.8 C)   Ht 5\' 4"  (1.626 m)   Wt 153 lb (69.4 kg)   SpO2 97%   BMI 26.26 kg/m   Assessment and Plan: 1. Essential hypertension Clinically stable exam with well controlled BP. Tolerating medications without side effects at this time. Pt to continue current regimen and low sodium diet; benefits of regular exercise as able discussed. - CBC with Differential/Platelet - Comprehensive metabolic panel - hydrochlorothiazide (HYDRODIURIL) 25 MG tablet; Take 1 tablet (25 mg total) by mouth daily.  Dispense: 100 tablet; Refill: 1 - metoprolol tartrate (LOPRESSOR) 25 MG tablet; Take 1 tablet (25 mg total) by mouth daily.  Dispense: 100 tablet; Refill: 1  2. Hyperlipidemia, mixed Continue medications - will advise if change is needed - Lipid panel  3. Generalized anxiety disorder Doing well on Bupropion.  No SI/HI, no side effects. - TSH - buPROPion (WELLBUTRIN XL) 150 MG 24 hr tablet; Take 1 tablet (150 mg total) by mouth daily.  Dispense: 100 tablet; Refill: 1  4. Allergy to alpha-gal Continues to avoid red meat but would like to be able to eat this again. Will need to wait until the level is very low. - Alpha-Gal Panel  5. Benign essential tremor Stable - may be slightly worse. She did not respond to gabapentin.  Will try Primidone. - primidone (MYSOLINE) 50 MG tablet; Take 1 tablet (50 mg total) by mouth 3 (three) times daily.  Dispense: 90 tablet; Refill: 1   Partially dictated using Editor, commissioning. Any errors are unintentional.  Halina Maidens, MD [facility]  01/10/2021       

## 2021-01-16 LAB — CBC WITH DIFFERENTIAL/PLATELET
Basophils Absolute: 0 10*3/uL (ref 0.0–0.2)
Basos: 1 %
EOS (ABSOLUTE): 0.1 10*3/uL (ref 0.0–0.4)
Eos: 2 %
Hematocrit: 33.2 % — ABNORMAL LOW (ref 34.0–46.6)
Hemoglobin: 10.8 g/dL — ABNORMAL LOW (ref 11.1–15.9)
Immature Grans (Abs): 0 10*3/uL (ref 0.0–0.1)
Immature Granulocytes: 1 %
Lymphocytes Absolute: 0.6 10*3/uL — ABNORMAL LOW (ref 0.7–3.1)
Lymphs: 15 %
MCH: 29.3 pg (ref 26.6–33.0)
MCHC: 32.5 g/dL (ref 31.5–35.7)
MCV: 90 fL (ref 79–97)
Monocytes Absolute: 0.5 10*3/uL (ref 0.1–0.9)
Monocytes: 13 %
Neutrophils Absolute: 2.7 10*3/uL (ref 1.4–7.0)
Neutrophils: 68 %
Platelets: 261 10*3/uL (ref 150–450)
RBC: 3.68 x10E6/uL — ABNORMAL LOW (ref 3.77–5.28)
RDW: 13.3 % (ref 11.7–15.4)
WBC: 3.9 10*3/uL (ref 3.4–10.8)

## 2021-01-16 LAB — LIPID PANEL
Chol/HDL Ratio: 3.3 ratio (ref 0.0–4.4)
Cholesterol, Total: 160 mg/dL (ref 100–199)
HDL: 49 mg/dL (ref >39)
LDL Chol Calc (NIH): 85 mg/dL (ref 0–99)
Triglycerides: 149 mg/dL (ref 0–149)
VLDL Cholesterol Cal: 26 mg/dL (ref 5–40)

## 2021-01-16 LAB — COMPREHENSIVE METABOLIC PANEL
ALT: 17 IU/L (ref 0–32)
AST: 22 IU/L (ref 0–40)
Albumin/Globulin Ratio: 2.4 — ABNORMAL HIGH (ref 1.2–2.2)
Albumin: 4.8 g/dL — ABNORMAL HIGH (ref 3.7–4.7)
Alkaline Phosphatase: 93 IU/L (ref 44–121)
BUN/Creatinine Ratio: 17 (ref 12–28)
BUN: 16 mg/dL (ref 8–27)
Bilirubin Total: 0.2 mg/dL (ref 0.0–1.2)
CO2: 27 mmol/L (ref 20–29)
Calcium: 9.8 mg/dL (ref 8.7–10.3)
Chloride: 98 mmol/L (ref 96–106)
Creatinine, Ser: 0.96 mg/dL (ref 0.57–1.00)
Globulin, Total: 2 g/dL (ref 1.5–4.5)
Glucose: 85 mg/dL (ref 70–99)
Potassium: 4.3 mmol/L (ref 3.5–5.2)
Sodium: 137 mmol/L (ref 134–144)
Total Protein: 6.8 g/dL (ref 6.0–8.5)
eGFR: 61 mL/min/{1.73_m2} (ref >59)

## 2021-01-16 LAB — ALPHA-GAL PANEL
Allergen Lamb IgE: <0.1 kU/L
Beef IgE: 0.27 kU/L — AB
IgE (Immunoglobulin E), Serum: 15 IU/mL (ref 6–495)
O215-IgE Alpha-Gal: 0.63 kU/L — AB
Pork IgE: <0.1 kU/L

## 2021-01-16 LAB — TSH: TSH: 1.72 u[IU]/mL (ref 0.450–4.500)

## 2021-01-18 ENCOUNTER — Other Ambulatory Visit: Payer: Self-pay | Admitting: Internal Medicine

## 2021-01-18 DIAGNOSIS — Z1231 Encounter for screening mammogram for malignant neoplasm of breast: Secondary | ICD-10-CM

## 2021-02-03 ENCOUNTER — Other Ambulatory Visit: Payer: Self-pay | Admitting: Internal Medicine

## 2021-02-03 DIAGNOSIS — G250 Essential tremor: Secondary | ICD-10-CM

## 2021-02-03 NOTE — Telephone Encounter (Signed)
Requested medication (s) are due for refill today: No  Requested medication (s) are on the active medication list: yes  Last refill:  01/10/21 #90/1RF  Future visit scheduled: Yes  Notes to clinic: Unable to refill per protocol, cannot delegate. Pt should have refills remaining on Rx.      Requested Prescriptions  Pending Prescriptions Disp Refills   primidone (MYSOLINE) 50 MG tablet [Pharmacy Med Name: PRIMIDONE 50 MG TABLET] 90 tablet 1    Sig: TAKE 1 TABLET BY MOUTH THREE TIMES A DAY     Not Delegated - Neurology:  Anticonvulsants Failed - 02/03/2021  9:34 AM      Failed - This refill cannot be delegated      Failed - HCT in normal range and within 360 days    Hematocrit  Date Value Ref Range Status  01/10/2021 33.2 (L) 34.0 - 46.6 % Final          Failed - HGB in normal range and within 360 days    Hemoglobin  Date Value Ref Range Status  01/10/2021 10.8 (L) 11.1 - 15.9 g/dL Final          Passed - PLT in normal range and within 360 days    Platelets  Date Value Ref Range Status  01/10/2021 261 150 - 450 x10E3/uL Final          Passed - WBC in normal range and within 360 days    WBC  Date Value Ref Range Status  01/10/2021 3.9 3.4 - 10.8 x10E3/uL Final  08/07/2015 6.3 3.6 - 11.0 K/uL Final          Passed - Valid encounter within last 12 months    Recent Outpatient Visits           3 weeks ago Essential hypertension   [place], [place]   6 months ago Arthritis of knee, left   [facility] Glean Hess, MD   1 year ago Essential hypertension   [place], Laura H, MD   1 year ago Essential hypertension   [facility] Glean Hess, MD   1 year ago Generalized anxiety disorder   [facility] Glean Hess, MD       Future Appointments             In 1 month Army Melia Jesse Sans, MD [facility], [place]   In 5 months Army Melia, Jesse Sans, MD [facility], [facility]

## 2021-02-22 ENCOUNTER — Other Ambulatory Visit: Payer: Self-pay

## 2021-02-22 ENCOUNTER — Ambulatory Visit
Admission: RE | Admit: 2021-02-22 | Discharge: 2021-02-22 | Disposition: A | Discharge status: Home / Self Care | Payer: Medicare HMO | Source: Ambulatory Visit | Attending: Internal Medicine | Admitting: Internal Medicine

## 2021-02-22 DIAGNOSIS — Z1231 Encounter for screening mammogram for malignant neoplasm of breast: Secondary | ICD-10-CM | POA: Insufficient documentation

## 2021-03-06 DIAGNOSIS — D2261 Melanocytic nevi of right upper limb, including shoulder: Secondary | ICD-10-CM | POA: Diagnosis not present

## 2021-03-06 DIAGNOSIS — L298 Other pruritus: Secondary | ICD-10-CM | POA: Diagnosis not present

## 2021-03-06 DIAGNOSIS — Z85828 Personal history of other malignant neoplasm of skin: Secondary | ICD-10-CM | POA: Diagnosis not present

## 2021-03-06 DIAGNOSIS — L820 Inflamed seborrheic keratosis: Secondary | ICD-10-CM | POA: Diagnosis not present

## 2021-03-06 DIAGNOSIS — D2272 Melanocytic nevi of left lower limb, including hip: Secondary | ICD-10-CM | POA: Diagnosis not present

## 2021-03-06 DIAGNOSIS — D225 Melanocytic nevi of trunk: Secondary | ICD-10-CM | POA: Diagnosis not present

## 2021-03-06 DIAGNOSIS — L538 Other specified erythematous conditions: Secondary | ICD-10-CM | POA: Diagnosis not present

## 2021-03-13 ENCOUNTER — Other Ambulatory Visit: Payer: Self-pay

## 2021-03-13 ENCOUNTER — Encounter: Payer: Self-pay | Admitting: Internal Medicine

## 2021-03-13 ENCOUNTER — Ambulatory Visit (INDEPENDENT_AMBULATORY_CARE_PROVIDER_SITE_OTHER): Payer: Medicare HMO | Admitting: Internal Medicine

## 2021-03-13 VITALS — BP 138/64 | HR 76 | Ht 64.0 in | Wt 153.4 lb

## 2021-03-13 DIAGNOSIS — M19041 Primary osteoarthritis, right hand: Secondary | ICD-10-CM | POA: Diagnosis not present

## 2021-03-13 DIAGNOSIS — I10 Essential (primary) hypertension: Secondary | ICD-10-CM | POA: Diagnosis not present

## 2021-03-13 DIAGNOSIS — F411 Generalized anxiety disorder: Secondary | ICD-10-CM

## 2021-03-13 DIAGNOSIS — M19042 Primary osteoarthritis, left hand: Secondary | ICD-10-CM

## 2021-03-13 DIAGNOSIS — G250 Essential tremor: Secondary | ICD-10-CM

## 2021-03-13 DIAGNOSIS — R69 Illness, unspecified: Secondary | ICD-10-CM | POA: Diagnosis not present

## 2021-03-13 DIAGNOSIS — D649 Anemia, unspecified: Secondary | ICD-10-CM

## 2021-03-13 MED ORDER — BUPROPION HCL ER (XL) 300 MG PO TB24: 300.0000 mg | ORAL_TABLET | Freq: Every day | ORAL | 0 refills | Status: DC

## 2021-03-13 NOTE — Progress Notes (Signed)
Date:  03/13/2021   Name:  Alexandra Watson   DOB:  07-Nov-1945   MRN:  [id number]   Chief Complaint: Tremors  Hypertension This is a chronic problem. The problem is controlled. Associated symptoms include anxiety. Pertinent negatives include no chest pain, palpitations or shortness of breath. Past treatments include beta blockers and diuretics.  Anxiety Presents for follow-up visit. Symptoms include excessive worry, irritability and nervous/anxious behavior. Patient reports no chest pain, feeling of choking, palpitations, shortness of breath or suicidal ideas. Symptoms occur most days.    Hand Pain  There was no injury mechanism. The pain is present in the left hand and right hand. The quality of the pain is described as burning and cramping. The pain does not radiate. The pain is mild. The pain has been Constant since the incident. Associated symptoms include numbness. Pertinent negatives include no chest pain.  Tremor - onset several years ago.  Neurology prescribed gabapentin but patient discontinued it due to lack of benefit.  Seen here in November and prescribed Primidone. She was unable to take this due to severe nausea.  Lab Results  Component Value Date   NA 137 01/10/2021   K 4.3 01/10/2021   CO2 27 01/10/2021   GLUCOSE 85 01/10/2021   BUN 16 01/10/2021   CREATININE 0.96 01/10/2021   CALCIUM 9.8 01/10/2021   EGFR 61 01/10/2021   GFRNONAA 80 01/12/2020   Lab Results  Component Value Date   CHOL 160 01/10/2021   HDL 49 01/10/2021   LDLCALC 85 01/10/2021   TRIG 149 01/10/2021   CHOLHDL 3.3 01/10/2021   Lab Results  Component Value Date   TSH 1.720 01/10/2021   No results found for: HGBA1C Lab Results  Component Value Date   WBC 3.9 01/10/2021   HGB 10.8 (L) 01/10/2021   HCT 33.2 (L) 01/10/2021   MCV 90 01/10/2021   PLT 261 01/10/2021   Lab Results  Component Value Date   ALT 17 01/10/2021   AST 22 01/10/2021   ALKPHOS 93 01/10/2021   BILITOT 0.2  01/10/2021   No results found for: 25OHVITD2, 25OHVITD3, VD25OH   Review of Systems  Constitutional:  Positive for irritability. Negative for fatigue and fever.  Respiratory:  Negative for cough, chest tightness and shortness of breath.   Cardiovascular:  Negative for chest pain and palpitations.  Gastrointestinal:  Negative for abdominal distention, constipation and diarrhea.  Musculoskeletal:  Positive for arthralgias and myalgias.  Neurological:  Positive for tremors and numbness.  Psychiatric/Behavioral:  Negative for dysphoric mood and suicidal ideas. The patient is nervous/anxious.    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   Primary osteoarthritis of left knee 09/17/2018   Generalized anxiety disorder 11/02/2017   Weight loss, abnormal 10/11/2017   Neuropathy 07/06/2016   SUI (stress urinary incontinence, female) 11/11/2015   Incomplete emptying of bladder 11/11/2015   Recurrent UTI 11/10/2015   Arthritis of knee, left 07/01/2015   Insomnia 06/30/2015   Atrophic vaginitis 06/30/2015   Gastroesophageal reflux disease 06/30/2015   Osteopenia 05/20/2015   Anemia 09/14/2012   Constipation 08/27/2012   Essential hypertension 10/04/2011   Hyperlipidemia, mixed 10/04/2011    Allergies  Allergen Reactions   Galactose Anaphylaxis, Anxiety, Diarrhea, Itching and Shortness Of Breath   Primidone Nausea Only   Iodinated Contrast  Media Hives   Oxycodone Nausea And Vomiting and Nausea Only    Past Surgical History:  Procedure Laterality Date   ANKLE ARTHROSCOPY WITH OPEN REDUCTION INTERNAL FIXATION (ORIF)  07/2015   tri-malleolar   BUNIONECTOMY Bilateral 2001, 2005   CHOLECYSTECTOMY     COLONOSCOPY  05/2010   normal   GANGLION CYST EXCISION Left    THORACOTOMY / DECORTICATION PARIETAL PLEURA Left  06/2017   [facility]   TOTAL KNEE ARTHROPLASTY Left 11/08/2020   VATS Sleeve lobectomy Left 09/2011   typical variant carcinoid    Social History   Tobacco Use   Smoking status: Former    Packs/day: 0.25    Years: 20.00    Pack years: 5.00    Types: Cigarettes    Quit date: 1985    Years since quitting: 38.0   Smokeless tobacco: Never   Tobacco comments:    smoking cessation materials not required  Vaping Use   Vaping Use: Never used  Substance Use Topics   Alcohol use: Not Currently    Alcohol/week: 1.0 standard drink    Types: 1 Glasses of wine per week    Comment: stopped drinking wine 2 years   Drug use: No     Medication list has been reviewed and updated.  Current Meds  Medication Sig   Ascorbic Acid (VITAMIN C WITH ROSE HIPS) 500 MG tablet Take 500 mg by mouth daily.   buPROPion (WELLBUTRIN XL) 150 MG 24 hr tablet Take 1 tablet (150 mg total) by mouth daily.   Calcium Carbonate-Vit D-Min (CALCIUM 1200 PO) Take 1,200 mg by mouth.   Calcium-Magnesium-Zinc 333-133-5 MG TABS Take by mouth.   CHOLECALCIFEROL PO Take 1 tablet by mouth daily. 5000 iu   Elderberry 500 MG CAPS Take by mouth.   EPINEPHrine 0.3 mg/0.3 mL IJ SOAJ injection INJECT 0.3 MLS (0.3 MG TOTAL) INTO THE MUSCLE AS NEEDED (ANAPHYLAXIS). FOR UP TO 1 DOSE   hydrochlorothiazide (HYDRODIURIL) 25 MG tablet Take 1 tablet (25 mg total) by mouth daily.   Melatonin 10 MG TABS Take by mouth at bedtime as needed.   metoprolol tartrate (LOPRESSOR) 25 MG tablet Take 1 tablet (25 mg total) by mouth daily.   Misc Natural Products (TURMERIC CURCUMIN) CAPS Take 1 capsule by mouth daily. 1000 mg   Multiple Vitamin (MULTIVITAMIN WITH MINERALS) TABS tablet Take 1 tablet by mouth daily.   Omega-3 Fatty Acids (FISH OIL) 1000 MG CAPS Take by mouth.   psyllium (METAMUCIL) 58.6 % powder Take 1 packet by mouth as needed.   simvastatin (ZOCOR) 20 MG tablet TAKE 1 TABLET BY MOUTH EVERY DAY   Turmeric 1053 MG TABS Take by mouth.    zinc gluconate 50 MG tablet Take 50 mg by mouth daily. 2-3 times per week    PHQ 2/9 Scores 03/13/2021 01/10/2021 11/12/2020 07/22/2020  PHQ - 2 Score 0 0 0 0  PHQ- 9 Score 1 0 - 0    GAD 7 : Generalized Anxiety Score 03/13/2021 01/10/2021 07/22/2020 01/12/2020  Nervous, Anxious, on Edge 1 0 0 0  Control/stop worrying 1 1 0 0  Worry too much - different things 0 0 0 0  Trouble relaxing 0 0 0 0  Restless 0 0 0 0  Easily annoyed or irritable 0 0 0 0  Afraid - awful might happen 0 0 0 0  Total GAD 7 Score 2 1 0 0  Anxiety Difficulty Not difficult at all Not difficult at all Not difficult at all  Not difficult at all    BP Readings from Last 3 Encounters:  03/13/21 138/64  01/10/21 132/74  07/22/20 132/70    Physical Exam Vitals and nursing note reviewed.  Constitutional:      General: She is not in acute distress.    Appearance: Normal appearance. She is well-developed.  HENT:     Head: Normocephalic and atraumatic.  Neck:     Vascular: No carotid bruit.  Cardiovascular:     Rate and Rhythm: Normal rate and regular rhythm.     Pulses: Normal pulses.     Heart sounds: No murmur heard. Pulmonary:     Effort: Pulmonary effort is normal. No respiratory distress.  Musculoskeletal:     Right hand: Swelling present. Decreased range of motion.     Left hand: Swelling present. Decreased range of motion.     Cervical back: Normal range of motion.     Right lower leg: No edema.     Left lower leg: No edema.  Lymphadenopathy:     Cervical: No cervical adenopathy.  Skin:    General: Skin is warm and dry.     Findings: No rash.  Neurological:     Mental Status: She is alert and oriented to person, place, and time.     Sensory: Sensation is intact.     Motor: Tremor (intention tremor both UEs, mild head tremor) and atrophy (both thenar regions) present.  Psychiatric:        Mood and Affect: Mood normal.        Behavior: Behavior normal.    Wt Readings from Last 3 Encounters:   03/13/21 153 lb 6.4 oz (69.6 kg)  01/10/21 153 lb (69.4 kg)  07/22/20 150 lb (68 kg)    BP 138/64    Pulse 76    Ht _0  (1.626 m)    Wt 153 lb 6.4 oz (69.6 kg)    SpO2 99%    BMI 26.33 kg/m   Assessment and Plan: 1. Benign essential tremor Will try increasing metoprolol to bid If not improvement, will refer to Neurology.  2. Essential hypertension Clinically stable exam with well controlled BP. Tolerating medications without side effects at this time. Pt to continue current regimen and low sodium diet; benefits of regular exercise as able discussed.  3. Generalized anxiety disorder Worsened by her husbands anxiety over his daughter's death from 04/23/15 She thinks she might feel better on higher dose bupropion. - buPROPion (WELLBUTRIN XL) 300 MG 24 hr tablet; Take 1 tablet (300 mg total) by mouth daily.  Dispense: 100 tablet; Refill: 0  4. Osteoarthritis of fingers of hands, bilateral - Ambulatory referral to Orthopedic Surgery  5. Anemia, unspecified type Check labs to verify improvement s/p knee surgery. - CBC with Differential/Platelet - Iron, TIBC and Ferritin Panel   Partially dictated using Editor, commissioning. Any errors are unintentional.  Halina Maidens, MD [facility]  03/13/2021

## 2021-03-13 NOTE — Patient Instructions (Signed)
Increase metoprolol to twice a day.   Monitor your tremor and if improved, call for a new prescription of metoprolol.  If not improved, call to let me know what Neurologist you want to see and I will do a referral..

## 2021-03-16 LAB — CBC WITH DIFFERENTIAL/PLATELET
Basophils Absolute: 0 10*3/uL (ref 0.0–0.2)
Basos: 1 %
EOS (ABSOLUTE): 0.1 10*3/uL (ref 0.0–0.4)
Eos: 2 %
Hematocrit: 34 % (ref 34.0–46.6)
Hemoglobin: 11.3 g/dL (ref 11.1–15.9)
Immature Grans (Abs): 0 10*3/uL (ref 0.0–0.1)
Immature Granulocytes: 0 %
Lymphocytes Absolute: 0.8 10*3/uL (ref 0.7–3.1)
Lymphs: 19 %
MCH: 29.1 pg (ref 26.6–33.0)
MCHC: 33.2 g/dL (ref 31.5–35.7)
MCV: 88 fL (ref 79–97)
Monocytes Absolute: 0.6 10*3/uL (ref 0.1–0.9)
Monocytes: 14 %
Neutrophils Absolute: 2.6 10*3/uL (ref 1.4–7.0)
Neutrophils: 64 %
Platelets: 261 10*3/uL (ref 150–450)
RBC: 3.88 x10E6/uL (ref 3.77–5.28)
RDW: 13.9 % (ref 11.7–15.4)
WBC: 4 10*3/uL (ref 3.4–10.8)

## 2021-03-16 LAB — IRON,TIBC AND FERRITIN PANEL
Ferritin: 95 ng/mL (ref 15–150)
Iron Saturation: 31 % (ref 15–55)
Iron: 60 ug/dL (ref 27–139)
Total Iron Binding Capacity: 195 ug/dL — ABNORMAL LOW (ref 250–450)
UIBC: 135 ug/dL (ref 118–369)

## 2021-04-05 DIAGNOSIS — H52221 Regular astigmatism, right eye: Secondary | ICD-10-CM | POA: Diagnosis not present

## 2021-04-05 DIAGNOSIS — I10 Essential (primary) hypertension: Secondary | ICD-10-CM | POA: Diagnosis not present

## 2021-04-05 DIAGNOSIS — H2511 Age-related nuclear cataract, right eye: Secondary | ICD-10-CM | POA: Diagnosis not present

## 2021-04-05 DIAGNOSIS — H2512 Age-related nuclear cataract, left eye: Secondary | ICD-10-CM | POA: Diagnosis not present

## 2021-04-19 DIAGNOSIS — H2512 Age-related nuclear cataract, left eye: Secondary | ICD-10-CM | POA: Diagnosis not present

## 2021-04-19 DIAGNOSIS — H2511 Age-related nuclear cataract, right eye: Secondary | ICD-10-CM | POA: Diagnosis not present

## 2021-04-19 DIAGNOSIS — I10 Essential (primary) hypertension: Secondary | ICD-10-CM | POA: Diagnosis not present

## 2021-04-26 DIAGNOSIS — K219 Gastro-esophageal reflux disease without esophagitis: Secondary | ICD-10-CM | POA: Diagnosis not present

## 2021-04-26 DIAGNOSIS — Z1211 Encounter for screening for malignant neoplasm of colon: Secondary | ICD-10-CM | POA: Diagnosis not present

## 2021-06-25 ENCOUNTER — Other Ambulatory Visit: Payer: Self-pay | Admitting: Internal Medicine

## 2021-06-25 DIAGNOSIS — F411 Generalized anxiety disorder: Secondary | ICD-10-CM

## 2021-06-29 DIAGNOSIS — C7A090 Malignant carcinoid tumor of the bronchus and lung: Secondary | ICD-10-CM | POA: Diagnosis not present

## 2021-06-29 DIAGNOSIS — R160 Hepatomegaly, not elsewhere classified: Secondary | ICD-10-CM | POA: Diagnosis not present

## 2021-06-29 DIAGNOSIS — R1900 Intra-abdominal and pelvic swelling, mass and lump, unspecified site: Secondary | ICD-10-CM | POA: Diagnosis not present

## 2021-06-29 DIAGNOSIS — R69 Illness, unspecified: Secondary | ICD-10-CM | POA: Diagnosis not present

## 2021-06-29 DIAGNOSIS — R918 Other nonspecific abnormal finding of lung field: Secondary | ICD-10-CM | POA: Diagnosis not present

## 2021-07-07 ENCOUNTER — Other Ambulatory Visit: Payer: Self-pay | Admitting: Internal Medicine

## 2021-07-07 DIAGNOSIS — E782 Mixed hyperlipidemia: Secondary | ICD-10-CM

## 2021-07-07 NOTE — Telephone Encounter (Signed)
Requested Prescriptions  ?Pending Prescriptions Disp Refills  ?? simvastatin (ZOCOR) 20 MG tablet [Pharmacy Med Name: SIMVASTATIN 20 MG TABLET] 90 tablet 1  ?  Sig: TAKE 1 TABLET BY MOUTH EVERY DAY  ?  ? Cardiovascular:  Antilipid - Statins Failed - 07/07/2021  2:33 AM  ?  ?  Failed - Lipid Panel in normal range within the last 12 months  ?  Cholesterol, Total  ?Date Value Ref Range Status  ?01/10/2021 160 100 - 199 mg/dL Final  ? ?LDL Chol Calc (NIH)  ?Date Value Ref Range Status  ?01/10/2021 85 0 - 99 mg/dL Final  ? ?HDL  ?Date Value Ref Range Status  ?01/10/2021 49 >39 mg/dL Final  ? ?Triglycerides  ?Date Value Ref Range Status  ?01/10/2021 149 0 - 149 mg/dL Final  ? ?  ?  ?  Passed - Patient is not pregnant  ?  ?  Passed - Valid encounter within last 12 months  ?  Recent Outpatient Visits   ?      ? 3 months ago Benign essential tremor  ? [facility] Glean Hess, MD  ? 5 months ago Essential hypertension  ? [facility] Glean Hess, MD  ? 11 months ago Arthritis of knee, left  ? [facility] Glean Hess, MD  ? 1 year ago Essential hypertension  ? [facility] Glean Hess, MD  ? 1 year ago Essential hypertension  ? [facility] Glean Hess, MD  ?  ?  ?Future Appointments   ?        ? In 3 days Glean Hess, MD [facility], [place]  ?  ? ?  ?  ?  ? ?

## 2021-07-10 ENCOUNTER — Encounter: Payer: Self-pay | Admitting: Internal Medicine

## 2021-07-10 ENCOUNTER — Ambulatory Visit (INDEPENDENT_AMBULATORY_CARE_PROVIDER_SITE_OTHER): Payer: Medicare HMO | Admitting: Internal Medicine

## 2021-07-10 VITALS — BP 148/78 | HR 74 | Ht 64.0 in | Wt 153.0 lb

## 2021-07-10 DIAGNOSIS — G250 Essential tremor: Secondary | ICD-10-CM

## 2021-07-10 DIAGNOSIS — I10 Essential (primary) hypertension: Secondary | ICD-10-CM | POA: Diagnosis not present

## 2021-07-10 DIAGNOSIS — E782 Mixed hyperlipidemia: Secondary | ICD-10-CM | POA: Diagnosis not present

## 2021-07-10 DIAGNOSIS — C7A090 Malignant carcinoid tumor of the bronchus and lung: Secondary | ICD-10-CM | POA: Diagnosis not present

## 2021-07-10 DIAGNOSIS — Z91018 Allergy to other foods: Secondary | ICD-10-CM | POA: Diagnosis not present

## 2021-07-10 DIAGNOSIS — D649 Anemia, unspecified: Secondary | ICD-10-CM | POA: Diagnosis not present

## 2021-07-10 MED ORDER — HYDROCHLOROTHIAZIDE 25 MG PO TABS: 25.0000 mg | ORAL_TABLET | Freq: Every day | ORAL | 1 refills | Status: DC

## 2021-07-10 MED ORDER — METOPROLOL TARTRATE 37.5 MG PO TABS: 37.5000 mg | ORAL_TABLET | Freq: Every day | ORAL | 1 refills | Status: DC

## 2021-07-10 NOTE — Progress Notes (Signed)
? ? ?Date:  07/10/2021  ? ?Name:  Alexandra Watson   DOB:  Apr 08, 1945   MRN:  [id number] ? ? ?Chief Complaint: Hypertension ? ?Hypertension ?This is a chronic problem. The current episode started more than 1 year ago. The problem is unchanged. The problem is controlled. Pertinent negatives include no chest pain, headaches, palpitations or shortness of breath. Past treatments include diuretics and beta blockers. The current treatment provides significant improvement. There are no compliance problems.   ?Anemia ?Presents for follow-up visit. There has been no abdominal pain, light-headedness or palpitations. There are no compliance problems.   ?Tremor - she has tremor of hands and head.  Not worsening overall but does have intermittent worsening.  She has not been seen by Neurology. ? ?Lab Results  ?Component Value Date  ? NA 137 01/10/2021  ? K 4.3 01/10/2021  ? CO2 27 01/10/2021  ? GLUCOSE 85 01/10/2021  ? BUN 16 01/10/2021  ? CREATININE 0.96 01/10/2021  ? CALCIUM 9.8 01/10/2021  ? EGFR 61 01/10/2021  ? GFRNONAA 80 01/12/2020  ? ?Lab Results  ?Component Value Date  ? CHOL 160 01/10/2021  ? HDL 49 01/10/2021  ? [place] 85 01/10/2021  ? TRIG 149 01/10/2021  ? CHOLHDL 3.3 01/10/2021  ? ?Lab Results  ?Component Value Date  ? TSH 1.720 01/10/2021  ? ?No results found for: HGBA1C ?Lab Results  ?Component Value Date  ? WBC 4.0 03/13/2021  ? HGB 11.3 03/13/2021  ? HCT 34.0 03/13/2021  ? MCV 88 03/13/2021  ? PLT 261 03/13/2021  ? ?Lab Results  ?Component Value Date  ? ALT 17 01/10/2021  ? AST 22 01/10/2021  ? ALKPHOS 93 01/10/2021  ? BILITOT 0.2 01/10/2021  ? ?No results found for: 25OHVITD2, [place], VD25OH  ? ?Review of Systems  ?Constitutional:  Negative for fatigue and unexpected weight change.  ?HENT:  Negative for nosebleeds.   ?Eyes:  Negative for visual disturbance.  ?Respiratory:  Negative for cough, chest tightness, shortness of breath and wheezing.   ?Cardiovascular:  Negative for chest pain, palpitations and leg  swelling.  ?Gastrointestinal:  Negative for abdominal pain, constipation and diarrhea.  ?Neurological:  Positive for tremors. Negative for dizziness, weakness, light-headedness and headaches.  ? ?Patient Active Problem List  ? Diagnosis Date Noted  ? Benign essential tremor 01/10/2021  ? H/O total knee replacement, left 11/08/2020  ? Allergy to alpha-gal 06/17/2019  ? 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 Primary osteoarthritis of left knee 09/17/2018  ? Generalized anxiety disorder 11/02/2017  ? Weight loss, abnormal 10/11/2017  ? Neuropathy 07/06/2016  ? SUI (stress urinary incontinence, female) 11/11/2015  ? Incomplete emptying of bladder 11/11/2015  ? Recurrent UTI 11/10/2015  ? Arthritis of knee, left 07/01/2015  ? Insomnia 06/30/2015  ? Atrophic vaginitis 06/30/2015  ? Gastroesophageal reflux disease 06/30/2015  ? Osteopenia 05/20/2015  ? Anemia 09/14/2012  ? Constipation 08/27/2012  ? Essential hypertension 10/04/2011  ? Hyperlipidemia, mixed 10/04/2011  ? ? ?Allergies  ?Allergen Reactions  ? Galactose Anaphylaxis, Anxiety, Diarrhea, Itching and Shortness Of Breath  ? Primidone Nausea Only  ? Iodinated Contrast Media Hives  ? Oxycodone Nausea And Vomiting and Nausea Only  ? ? ?Past Surgical History:  ?Procedure Laterality Date  ? ANKLE ARTHROSCOPY WITH OPEN REDUCTION INTERNAL FIXATION (ORIF)  07/2015  ? tri-malleolar  ? BUNIONECTOMY Bilateral 2001, 2005  ? CHOLECYSTECTOMY    ? COLONOSCOPY  05/2010  ? normal  ? GANGLION CYST EXCISION Left   ? THORACOTOMY /  DECORTICATION PARIETAL PLEURA Left 06/2017  ? [facility]  ? TOTAL KNEE ARTHROPLASTY Left 11/08/2020  ? VATS Sleeve lobectomy Left 09/2011  ? typical variant carcinoid  ? ? ?Social History  ? ?Tobacco Use  ? Smoking status: Former  ?  Packs/day: 0.25  ?  Years: 20.00  ?  Pack years: 5.00  ?  Types: Cigarettes  ?  Quit date: 26  ?  Years since quitting: 38.3  ? Smokeless tobacco: Never  ? Tobacco comments:  ?  smoking cessation materials not required   ?Vaping Use  ? Vaping Use: Never used  ?Substance Use Topics  ? Alcohol use: Not Currently  ?  Alcohol/week: 1.0 standard drink  ?  Types: 1 Glasses of wine per week  ?  Comment: stopped drinking wine 2 years  ? Drug use: No  ? ? ? ?Medication list has been reviewed and updated. ? ?Current Meds  ?Medication Sig  ? Ascorbic Acid (VITAMIN C WITH ROSE HIPS) 500 MG tablet Take 500 mg by mouth daily.  ? buPROPion (WELLBUTRIN XL) 300 MG 24 hr tablet TAKE 1 TABLET BY MOUTH EVERY DAY  ? Calcium Carbonate-Vit D-Min (CALCIUM 1200 PO) Take 1,200 mg by mouth.  ? Calcium-Magnesium-Zinc 333-133-5 MG TABS Take by mouth.  ? CHOLECALCIFEROL PO Take 1 tablet by mouth daily. 5000 iu  ? Elderberry 500 MG CAPS Take by mouth.  ? EPINEPHrine 0.3 mg/0.3 mL IJ SOAJ injection INJECT 0.3 MLS (0.3 MG TOTAL) INTO THE MUSCLE AS NEEDED (ANAPHYLAXIS). FOR UP TO 1 DOSE  ? Melatonin 10 MG TABS Take by mouth at bedtime as needed.  ? Misc Natural Products (TURMERIC CURCUMIN) CAPS Take 1 capsule by mouth daily. 1000 mg  ? Multiple Vitamin (MULTIVITAMIN WITH MINERALS) TABS tablet Take 1 tablet by mouth daily.  ? Omega-3 Fatty Acids (FISH OIL) 1000 MG CAPS Take by mouth.  ? psyllium (METAMUCIL) 58.6 % powder Take 1 packet by mouth as needed.  ? simvastatin (ZOCOR) 20 MG tablet TAKE 1 TABLET BY MOUTH EVERY DAY  ? Turmeric 1053 MG TABS Take by mouth.  ? zinc gluconate 50 MG tablet Take 50 mg by mouth daily. 2-3 times per week  ? [DISCONTINUED] hydrochlorothiazide (HYDRODIURIL) 25 MG tablet Take 1 tablet (25 mg total) by mouth daily.  ? [DISCONTINUED] metoprolol tartrate (LOPRESSOR) 25 MG tablet Take 1 tablet (25 mg total) by mouth daily.  ? ? ? ?  07/10/2021  ? 10:56 AM 03/13/2021  ? 10:33 AM 01/10/2021  ? 10:43 AM 07/22/2020  ?  3:15 PM  ?GAD 7 : Generalized Anxiety Score  ?Nervous, Anxious, on Edge 1 1 0 0  ?Control/stop worrying 0 1 1 0  ?Worry too much - different things 1 0 0 0  ?Trouble relaxing 0 0 0 0  ?Restless 0 0 0 0  ?Easily annoyed or irritable  0 0 0 0  ?Afraid - awful might happen 0 0 0 0  ?Total GAD 7 Score '2 2 1 ' 0  ?Anxiety Difficulty Not difficult at all Not difficult at all Not difficult at all Not difficult at all  ? ? ? ?  07/10/2021  ? 10:56 AM  ?Depression screen PHQ 2/9  ?Decreased Interest 0  ?Down, Depressed, Hopeless 0  ?PHQ - 2 Score 0  ?Altered sleeping 0  ?Tired, decreased energy 0  ?Change in appetite 0  ?Feeling bad or failure about yourself  0  ?Trouble concentrating 0  ?Moving slowly or fidgety/restless 0  ?Suicidal thoughts 0  ?  PHQ-9 Score 0  ?Difficult doing work/chores Not difficult at all  ? ? ?BP Readings from Last 3 Encounters:  ?07/10/21 (!) 148/78  ?03/13/21 138/64  ?01/10/21 132/74  ? ? ?Physical Exam ?Vitals and nursing note reviewed.  ?Constitutional:   ?   General: She is not in acute distress. ?   Appearance: She is well-developed.  ?HENT:  ?   Head: Normocephalic and atraumatic.  ?Cardiovascular:  ?   Rate and Rhythm: Normal rate and regular rhythm.  ?   Pulses: Normal pulses.  ?Pulmonary:  ?   Effort: Pulmonary effort is normal. No respiratory distress.  ?   Breath sounds: No wheezing or rhonchi.  ?Musculoskeletal:  ?   Cervical back: Normal range of motion.  ?   Right lower leg: No edema.  ?   Left lower leg: No edema.  ?Lymphadenopathy:  ?   Cervical: No cervical adenopathy.  ?Skin: ?   General: Skin is warm and dry.  ?   Findings: No rash.  ?Neurological:  ?   Mental Status: She is alert and oriented to person, place, and time.  ?   Sensory: Sensation is intact.  ?   Motor: Tremor (intention tremor UEs and head) present.  ?Psychiatric:     ?   Mood and Affect: Mood normal.     ?   Behavior: Behavior normal.  ? ? ?Wt Readings from Last 3 Encounters:  ?07/10/21 153 lb (69.4 kg)  ?03/13/21 153 lb 6.4 oz (69.6 kg)  ?01/10/21 153 lb (69.4 kg)  ? ? ?BP (!) 148/78 (BP Location: Left Arm, Cuff Size: Normal)   Pulse 74   Ht '5\' 4"'  (1.626 m)   Wt 153 lb (69.4 kg)   SpO2 96%   BMI 26.26 kg/m?  ? ?Assessment and Plan: ?1.  Essential hypertension ?BP is running slightly too high.  She will monitor her sodium intake and BP at home. ?Will increase metoprolol to 37.5 mg and recheck in 2 months ?- hydrochlorothiazide (HYDRODIURIL) 25

## 2021-07-14 LAB — CBC WITH DIFFERENTIAL/PLATELET
Basophils Absolute: 0 10*3/uL (ref 0.0–0.2)
Basos: 1 %
EOS (ABSOLUTE): 0.1 10*3/uL (ref 0.0–0.4)
Eos: 2 %
Hematocrit: 32.1 % — ABNORMAL LOW (ref 34.0–46.6)
Hemoglobin: 10.7 g/dL — ABNORMAL LOW (ref 11.1–15.9)
Immature Grans (Abs): 0 10*3/uL (ref 0.0–0.1)
Immature Granulocytes: 0 %
Lymphocytes Absolute: 0.7 10*3/uL (ref 0.7–3.1)
Lymphs: 19 %
MCH: 29.6 pg (ref 26.6–33.0)
MCHC: 33.3 g/dL (ref 31.5–35.7)
MCV: 89 fL (ref 79–97)
Monocytes Absolute: 0.5 10*3/uL (ref 0.1–0.9)
Monocytes: 15 %
Neutrophils Absolute: 2.3 10*3/uL (ref 1.4–7.0)
Neutrophils: 63 %
Platelets: 237 10*3/uL (ref 150–450)
RBC: 3.61 x10E6/uL — ABNORMAL LOW (ref 3.77–5.28)
RDW: 13.1 % (ref 11.7–15.4)
WBC: 3.6 10*3/uL (ref 3.4–10.8)

## 2021-07-14 LAB — ALPHA-GAL PANEL
Allergen Lamb IgE: 0.18 kU/L — AB
Beef IgE: 0.2 kU/L — AB
IgE (Immunoglobulin E), Serum: 12 IU/mL (ref 6–495)
O215-IgE Alpha-Gal: 0.57 kU/L — AB
Pork IgE: <0.1 kU/L

## 2021-07-30 ENCOUNTER — Encounter: Payer: Self-pay | Admitting: Gastroenterology

## 2021-07-30 NOTE — H&P (Signed)
Pre-Procedure H&P   Patient ID: Alexandra Watson is a 76 y.o. female.  Gastroenterology Provider: Annamaria Helling, DO  Referring Provider: Laurine Blazer, PA PCP: Glean Hess, MD  Date: 07/30/2021  HPI [name] is a 76 y.o. female who presents today for Colonoscopy for Screening colonoscopy.  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    Past Surgical History:  Procedure Laterality Date   ANKLE ARTHROSCOPY WITH OPEN REDUCTION INTERNAL FIXATION (ORIF)  07/2015   tri-malleolar   BUNIONECTOMY Bilateral 2001, 2005   CHOLECYSTECTOMY     COLONOSCOPY  05/2010   normal   GANGLION CYST EXCISION Left    THORACOTOMY / DECORTICATION PARIETAL PLEURA Left 06/2017   [facility]   TOTAL KNEE ARTHROPLASTY Left 11/08/2020   VATS Sleeve lobectomy Left 09/2011   typical variant carcinoid    Family History Daughter- pancreatic ca- 14 y/o No h/o GI disease or malignancy  Review of Systems  Constitutional:  Negative for activity change, appetite change, chills, diaphoresis, fatigue, fever and unexpected weight change.  HENT:  Negative for trouble swallowing and voice change.   Respiratory:  Negative for shortness of breath and wheezing.   Cardiovascular:  Negative for chest pain, palpitations and leg swelling.  Gastrointestinal:  Positive for constipation. Negative for abdominal distention, abdominal pain, anal bleeding, blood in stool, diarrhea, nausea, rectal pain  and vomiting.  Musculoskeletal:  Negative for arthralgias and myalgias.  Skin:  Negative for color change and pallor.  Neurological:  Negative for dizziness, syncope and weakness.  Psychiatric/Behavioral:  Negative for confusion.   All other systems reviewed and are negative.   Medications No current facility-administered medications on file prior to encounter.   Current Outpatient Medications on File Prior to Encounter  Medication Sig Dispense Refill   Ascorbic Acid (VITAMIN C WITH ROSE HIPS) 500 MG tablet Take 500 mg by mouth daily.     Calcium Carbonate-Vit D-Min (CALCIUM 1200 PO) Take 1,200 mg by mouth.     Calcium-Magnesium-Zinc 333-133-5 MG TABS Take by mouth.     CHOLECALCIFEROL PO Take 1 tablet by mouth daily. 5000 iu     Elderberry 500 MG CAPS Take by mouth.     EPINEPHrine 0.3 mg/0.3 mL IJ SOAJ injection INJECT 0.3 MLS (0.3 MG TOTAL) INTO THE MUSCLE AS NEEDED (ANAPHYLAXIS). FOR UP TO 1 DOSE     Melatonin 10 MG TABS Take by mouth at bedtime as needed.     Misc Natural Products (TURMERIC CURCUMIN) CAPS Take 1 capsule by mouth daily. 1000 mg     Multiple Vitamin (MULTIVITAMIN WITH MINERALS) TABS tablet Take 1 tablet by mouth daily.     Omega-3 Fatty Acids (FISH OIL) 1000 MG CAPS Take by mouth.     psyllium (METAMUCIL) 58.6 % powder Take 1 packet by mouth as needed.     Turmeric 1053 MG TABS Take by mouth.     zinc gluconate 50  MG tablet Take 50 mg by mouth daily. 2-3 times per week      Pertinent medications related to GI and procedure were reviewed by me with the patient prior to the procedure  No current facility-administered medications for this encounter.  Current Outpatient Medications:    Ascorbic Acid (VITAMIN C WITH ROSE HIPS) 500 MG tablet, Take 500 mg by mouth daily., Disp: , Rfl:    buPROPion (WELLBUTRIN XL) 300 MG 24 hr tablet, TAKE 1 TABLET BY MOUTH EVERY DAY, Disp: 100 tablet, Rfl: 0   Calcium Carbonate-Vit D-Min (CALCIUM 1200 PO), Take 1,200 mg by mouth., Disp:  , Rfl:    Calcium-Magnesium-Zinc 333-133-5 MG TABS, Take by mouth., Disp: , Rfl:    CHOLECALCIFEROL PO, Take 1 tablet by mouth daily. 5000 iu, Disp: , Rfl:    Elderberry 500 MG CAPS, Take by mouth., Disp: , Rfl:    EPINEPHrine 0.3 mg/0.3 mL IJ SOAJ injection, INJECT 0.3 MLS (0.3 MG TOTAL) INTO THE MUSCLE AS NEEDED (ANAPHYLAXIS). FOR UP TO 1 DOSE, Disp: , Rfl:    hydrochlorothiazide (HYDRODIURIL) 25 MG tablet, Take 1 tablet (25 mg total) by mouth daily., Disp: 100 tablet, Rfl: 1   Melatonin 10 MG TABS, Take by mouth at bedtime as needed., Disp: , Rfl:    metoprolol tartrate 37.5 MG TABS, Take 37.5 mg by mouth daily., Disp: 100 tablet, Rfl: 1   Misc Natural Products (TURMERIC CURCUMIN) CAPS, Take 1 capsule by mouth daily. 1000 mg, Disp: , Rfl:    Multiple Vitamin (MULTIVITAMIN WITH MINERALS) TABS tablet, Take 1 tablet by mouth daily., Disp: , Rfl:    Omega-3 Fatty Acids (FISH OIL) 1000 MG CAPS, Take by mouth., Disp: , Rfl:    psyllium (METAMUCIL) 58.6 % powder, Take 1 packet by mouth as needed., Disp: , Rfl:    simvastatin (ZOCOR) 20 MG tablet, TAKE 1 TABLET BY MOUTH EVERY DAY, Disp: 90 tablet, Rfl: 0   Turmeric 1053 MG TABS, Take by mouth., Disp: , Rfl:    zinc gluconate 50 MG tablet, Take 50 mg by mouth daily. 2-3 times per week, Disp: , Rfl:       Allergies  Allergen Reactions   Galactose Anaphylaxis, Anxiety, Diarrhea, Itching and Shortness Of Breath   Primidone Nausea Only   Iodinated Contrast Media Hives   Oxycodone Nausea And Vomiting and Nausea Only   Allergies were reviewed by me prior to the procedure  Objective   There is no height or weight on file to calculate BMI. There were no vitals filed for this visit.  *** Physical Exam Vitals and nursing note reviewed.  Constitutional:      General: She is not in acute distress.    Appearance: Normal appearance. She is not ill-appearing, toxic-appearing or diaphoretic.  HENT:     Head: Normocephalic and atraumatic.     Nose:  Nose normal.     Mouth/Throat:     Mouth: Mucous membranes are moist.     Pharynx: Oropharynx is clear.  Eyes:     General: No scleral icterus.    Extraocular Movements: Extraocular movements intact.  Cardiovascular:     Rate and Rhythm: Normal rate and regular rhythm.     Heart sounds: Normal heart sounds. No murmur heard.   No friction rub. No gallop.  Pulmonary:     Effort: Pulmonary effort is normal. No respiratory distress.     Breath sounds: Normal breath sounds. No wheezing, rhonchi or rales.  Abdominal:  General: Abdomen is flat. Bowel sounds are normal. There is no distension.     Palpations: Abdomen is soft.     Tenderness: There is no abdominal tenderness. There is no guarding or rebound.  Musculoskeletal:     Cervical back: Neck supple.     Right lower leg: No edema.     Left lower leg: No edema.  Skin:    General: Skin is warm and dry.     Coloration: Skin is not jaundiced or pale.  Neurological:     General: No focal deficit present.     Mental Status: She is alert and oriented to person, place, and time. Mental status is at baseline.  Psychiatric:        Mood and Affect: Mood normal.        Behavior: Behavior normal.        Thought Content: Thought content normal.        Judgment: Judgment normal.     Assessment:  Ms. NELLA Watson is a 76 y.o. female  who presents today for Colonoscopy for Screening colonoscopy.  Plan:  Colonoscopy with possible intervention today  Colonoscopy with possible biopsy, control of bleeding, polypectomy, and interventions as necessary has been discussed with the patient/patient representative. Informed consent was obtained from the patient/patient representative after explaining the indication, nature, and risks of the procedure including but not limited to death, bleeding, perforation, missed neoplasm/lesions, cardiorespiratory compromise, and reaction to medications. Opportunity for questions was given and appropriate  answers were provided. Patient/patient representative has verbalized understanding is amenable to undergoing the procedure.   Annamaria Helling, DO  [facility]  Portions of the record may have been created with voice recognition software. Occasional wrong-word or 'sound-a-like' substitutions may have occurred due to the inherent limitations of voice recognition software.  Read the chart carefully and recognize, using context, where substitutions may have occurred.

## 2021-07-31 ENCOUNTER — Encounter: Payer: Self-pay | Admitting: Gastroenterology

## 2021-07-31 ENCOUNTER — Other Ambulatory Visit: Payer: Self-pay

## 2021-07-31 ENCOUNTER — Encounter: Payer: Self-pay | Admitting: Anesthesiology

## 2021-07-31 ENCOUNTER — Ambulatory Visit
Admission: RE | Admit: 2021-07-31 | Discharge: 2021-07-31 | Disposition: A | Discharge status: Home / Self Care | Payer: Medicare HMO | Source: Ambulatory Visit | Attending: Gastroenterology | Admitting: Gastroenterology

## 2021-07-31 ENCOUNTER — Encounter
Admission: RE | Disposition: A | Discharge status: Home / Self Care | Payer: Self-pay | Source: Ambulatory Visit | Attending: Gastroenterology

## 2021-07-31 DIAGNOSIS — Z538 Procedure and treatment not carried out for other reasons: Secondary | ICD-10-CM | POA: Diagnosis not present

## 2021-07-31 DIAGNOSIS — Z1211 Encounter for screening for malignant neoplasm of colon: Secondary | ICD-10-CM | POA: Diagnosis not present

## 2021-07-31 HISTORY — DX: Malignant neoplasm of liver, not specified as primary or secondary: C22.9

## 2021-07-31 SURGERY — COLONOSCOPY WITH PROPOFOL
Anesthesia: General

## 2021-07-31 MED ORDER — PROPOFOL 500 MG/50ML IV EMUL
INTRAVENOUS | Status: AC
  Filled 2021-07-31: qty 50

## 2021-07-31 MED ORDER — DEXMEDETOMIDINE HCL IN NACL 80 MCG/20ML IV SOLN
INTRAVENOUS | Status: AC
  Filled 2021-07-31: qty 20

## 2021-07-31 MED ORDER — LIDOCAINE HCL (PF) 2 % IJ SOLN
INTRAMUSCULAR | Status: AC
  Filled 2021-07-31: qty 5

## 2021-07-31 MED ORDER — PROPOFOL 10 MG/ML IV BOLUS
INTRAVENOUS | Status: AC
  Filled 2021-07-31: qty 20

## 2021-07-31 MED ORDER — SODIUM CHLORIDE 0.9 % IV SOLN: INTRAVENOUS | Status: DC

## 2021-07-31 NOTE — Anesthesia Preprocedure Evaluation (Signed)
Anesthesia Evaluation  Patient identified by MRN, date of birth, ID band Patient awake    Reviewed: Allergy & Precautions, H&P , NPO status , Patient's Chart, lab work & pertinent test results, reviewed documented beta blocker date and time   Airway Mallampati: II   Neck ROM: full    Dental  (+) Poor Dentition   Pulmonary neg pulmonary ROS, former smoker,    Pulmonary exam normal        Cardiovascular Exercise Tolerance: Poor hypertension, On Medications negative cardio ROS Normal cardiovascular exam Rhythm:regular Rate:Normal     Neuro/Psych PSYCHIATRIC DISORDERS Anxiety Depression negative neurological ROS     GI/Hepatic Neg liver ROS, GERD  Medicated,  Endo/Other  negative endocrine ROS  Renal/GU negative Renal ROS  negative genitourinary   Musculoskeletal   Abdominal   Peds  Hematology  (+) Blood dyscrasia, anemia ,   Anesthesia Other Findings Past Medical History: No date: Anxiety No date: Depression 08/08/2015: Fracture of ankle, trimalleolar, left, closed No date: GERD (gastroesophageal reflux disease) No date: Hyperlipidemia No date: Hypertension 09/2011/2019: Lung cancer ([place])     Comment:  left lung broncial ca Past Surgical History: 07/2015: ANKLE ARTHROSCOPY WITH OPEN REDUCTION INTERNAL FIXATION  (ORIF)     Comment:  tri-malleolar 2001, 2005: BUNIONECTOMY; Bilateral No date: CHOLECYSTECTOMY 05/2010: COLONOSCOPY     Comment:  normal No date: GANGLION CYST EXCISION; Left 06/2017: THORACOTOMY / DECORTICATION PARIETAL PLEURA; Left     Comment:  [facility] 11/08/2020: TOTAL KNEE ARTHROPLASTY; Left 09/2011: VATS Sleeve lobectomy; Left     Comment:  typical variant carcinoid   Reproductive/Obstetrics negative OB ROS                             Anesthesia Physical Anesthesia Plan  ASA: 3  Anesthesia Plan: General   Post-op Pain Management:    Induction:   PONV Risk  Score and Plan:   Airway Management Planned:   Additional Equipment:   Intra-op Plan:   Post-operative Plan:   Informed Consent: I have reviewed the patients History and Physical, chart, labs and discussed the procedure including the risks, benefits and alternatives for the proposed anesthesia with the patient or authorized representative who has indicated his/her understanding and acceptance.     Dental Advisory Given  Plan Discussed with: CRNA  Anesthesia Plan Comments:         Anesthesia Quick Evaluation

## 2021-07-31 NOTE — Progress Notes (Signed)
Patient came in for her colonoscopy this morning, but per her description, she had poor results from her colon prep.  Dr Virgina Jock spoke with the patient and it was decided to reschedule.  Office will call the patient for new date.

## 2021-08-01 DIAGNOSIS — L089 Local infection of the skin and subcutaneous tissue, unspecified: Secondary | ICD-10-CM | POA: Diagnosis not present

## 2021-08-07 DIAGNOSIS — L258 Unspecified contact dermatitis due to other agents: Secondary | ICD-10-CM | POA: Diagnosis not present

## 2021-08-07 DIAGNOSIS — L309 Dermatitis, unspecified: Secondary | ICD-10-CM | POA: Diagnosis not present

## 2021-08-08 DIAGNOSIS — M79644 Pain in right finger(s): Secondary | ICD-10-CM | POA: Diagnosis not present

## 2021-08-08 DIAGNOSIS — G5601 Carpal tunnel syndrome, right upper limb: Secondary | ICD-10-CM | POA: Diagnosis not present

## 2021-08-15 ENCOUNTER — Other Ambulatory Visit: Payer: Self-pay | Admitting: Internal Medicine

## 2021-08-15 DIAGNOSIS — I10 Essential (primary) hypertension: Secondary | ICD-10-CM

## 2021-08-15 NOTE — Telephone Encounter (Signed)
med dc'd 07/10/21 dc'd by Dr Army Melia "reorder" dose changed   Requested Prescriptions  Refused Prescriptions Disp Refills  . metoprolol tartrate (LOPRESSOR) 25 MG tablet [Pharmacy Med Name: METOPROLOL TARTRATE 25 MG TAB] 100 tablet 1    Sig: TAKE 1 TABLET (25 MG TOTAL) BY MOUTH DAILY.     Cardiovascular:  Beta Blockers Failed - 08/15/2021  1:59 AM      Failed - Last BP in normal range    BP Readings from Last 1 Encounters:  07/31/21 (!) 173/71         Passed - Last Heart Rate in normal range    Pulse Readings from Last 1 Encounters:  07/31/21 63         Passed - Valid encounter within last 6 months    Recent Outpatient Visits          1 month ago Essential hypertension   [facility], [place]   5 months ago Benign essential tremor   [facility] Glean Hess, MD   7 months ago Essential hypertension   [facility] Glean Hess, MD   1 year ago Arthritis of knee, left   [facility] Glean Hess, MD   1 year ago Essential hypertension   [facility] Glean Hess, MD      Future Appointments            In 1 month Army Melia Jesse Sans, MD [facility], [place]   In 5 months Army Melia, Jesse Sans, MD [facility], [facility]

## 2021-08-18 DIAGNOSIS — L989 Disorder of the skin and subcutaneous tissue, unspecified: Secondary | ICD-10-CM | POA: Diagnosis not present

## 2021-08-18 DIAGNOSIS — L309 Dermatitis, unspecified: Secondary | ICD-10-CM | POA: Diagnosis not present

## 2021-09-15 ENCOUNTER — Encounter: Payer: Self-pay | Admitting: Internal Medicine

## 2021-09-15 ENCOUNTER — Ambulatory Visit (INDEPENDENT_AMBULATORY_CARE_PROVIDER_SITE_OTHER): Payer: Medicare HMO | Admitting: Internal Medicine

## 2021-09-15 VITALS — BP 128/60 | HR 63 | Ht 64.0 in | Wt 150.0 lb

## 2021-09-15 DIAGNOSIS — I10 Essential (primary) hypertension: Secondary | ICD-10-CM

## 2021-09-15 DIAGNOSIS — C7A090 Malignant carcinoid tumor of the bronchus and lung: Secondary | ICD-10-CM | POA: Diagnosis not present

## 2021-09-15 DIAGNOSIS — R69 Illness, unspecified: Secondary | ICD-10-CM | POA: Diagnosis not present

## 2021-09-15 DIAGNOSIS — F411 Generalized anxiety disorder: Secondary | ICD-10-CM

## 2021-09-15 DIAGNOSIS — E782 Mixed hyperlipidemia: Secondary | ICD-10-CM | POA: Diagnosis not present

## 2021-09-15 MED ORDER — SIMVASTATIN 20 MG PO TABS: 20.0000 mg | ORAL_TABLET | Freq: Every day | ORAL | 1 refills | Status: DC

## 2021-09-15 NOTE — Progress Notes (Signed)
Date:  09/15/2021   Name:  Alexandra Watson   DOB:  [birth year]   MRN:  [id number]   Chief Complaint: Hypertension  Hypertension This is a chronic problem. The problem is controlled. Associated symptoms include anxiety. Pertinent negatives include no chest pain, headaches, palpitations or shortness of breath. Past treatments include diuretics and beta blockers. There is no history of kidney disease, CAD/MI or CVA.  Hyperlipidemia This is a chronic problem. The problem is controlled. Pertinent negatives include no chest pain or shortness of breath. Current antihyperlipidemic treatment includes statins. The current treatment provides significant improvement of lipids.  Anxiety Presents for follow-up visit. Symptoms include nervous/anxious behavior. Patient reports no chest pain, dizziness, palpitations, shortness of breath or suicidal ideas. Symptoms occur occasionally. The severity of symptoms is mild. The quality of sleep is good.   Compliance with medications is 76-100%.  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 Per last oncology note 06/2021: CT done today, 06/29/2021, demonstrates further slight decrease in the size of her liver metastases. So she has had a clear benefit and she completed the treatment in March 2022. We have now resumed watchful waiting with CT scans (without contrast) every 6 months.  Lab Results  Component Value Date   NA 137 01/10/2021   K 4.3 01/10/2021   CO2 27 01/10/2021   GLUCOSE 85 01/10/2021   BUN 16 01/10/2021   CREATININE 0.96 01/10/2021   CALCIUM 9.8 01/10/2021   EGFR 61 01/10/2021   GFRNONAA 80 01/12/2020   Lab Results  Component Value Date   CHOL 160 01/10/2021   HDL 49 01/10/2021   LDLCALC 85 01/10/2021   TRIG 149 01/10/2021   CHOLHDL 3.3 01/10/2021   Lab Results  Component Value Date   TSH 1.720 01/10/2021   No results found for: "HGBA1C" Lab Results  Component Value Date   WBC 3.6 07/10/2021   HGB 10.7  (L) 07/10/2021   HCT 32.1 (L) 07/10/2021   MCV 89 07/10/2021   PLT 237 07/10/2021   Lab Results  Component Value Date   ALT 17 01/10/2021   AST 22 01/10/2021   ALKPHOS 93 01/10/2021   BILITOT 0.2 01/10/2021   No results found for: "25OHVITD2", "25OHVITD3", "VD25OH"   Review of Systems  Constitutional:  Negative for chills, fatigue and unexpected weight change.  HENT:  Negative for nosebleeds.   Eyes:  Negative for visual disturbance.  Respiratory:  Positive for cough. Negative for chest tightness, shortness of breath and wheezing.   Cardiovascular:  Negative for chest pain, palpitations and leg swelling.  Gastrointestinal:  Negative for abdominal pain, constipation and diarrhea.  Neurological:  Negative for dizziness, weakness, light-headedness and headaches.  Hematological:  Does not bruise/bleed easily.  Psychiatric/Behavioral:  Negative for dysphoric mood, sleep disturbance and suicidal ideas. The patient is nervous/anxious.     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   Primary osteoarthritis of left knee 09/17/2018   Generalized anxiety disorder 11/02/2017   Weight loss, abnormal 10/11/2017   Neuropathy 07/06/2016   SUI (stress urinary incontinence, female) 11/11/2015   Incomplete emptying of bladder 11/11/2015   Recurrent UTI 11/10/2015   Arthritis of knee, left 07/01/2015   Insomnia 06/30/2015   Atrophic vaginitis 06/30/2015   Gastroesophageal reflux disease 06/30/2015   Osteopenia 05/20/2015  Anemia 09/14/2012   Constipation 08/27/2012   Essential hypertension 10/04/2011   Hyperlipidemia, mixed 10/04/2011    Allergies  Allergen Reactions   Galactose Anaphylaxis, Anxiety, Diarrhea, Itching and Shortness Of Breath   Primidone Nausea Only   Iodinated Contrast Media Hives   Oxycodone  Nausea And Vomiting and Nausea Only    Past Surgical History:  Procedure Laterality Date   ANKLE ARTHROSCOPY WITH OPEN REDUCTION INTERNAL FIXATION (ORIF)  07/2015   tri-malleolar   BUNIONECTOMY Bilateral 2001, 2005   CHOLECYSTECTOMY     COLONOSCOPY  05/2010   normal   GANGLION CYST EXCISION Left    THORACOTOMY / DECORTICATION PARIETAL PLEURA Left 06/2017   [facility]   TOTAL KNEE ARTHROPLASTY Left 11/08/2020   VATS Sleeve lobectomy Left 09/2011   typical variant carcinoid    Social History   Tobacco Use   Smoking status: Former    Packs/day: 0.25    Years: 20.00    Total pack years: 5.00    Types: Cigarettes    Quit date: 1985    Years since quitting: 38.5   Smokeless tobacco: Never   Tobacco comments:    smoking cessation materials not required  Vaping Use   Vaping Use: Never used  Substance Use Topics   Alcohol use: Not Currently    Alcohol/week: 1.0 standard drink of alcohol    Types: 1 Glasses of wine per week    Comment: stopped drinking wine 2 years   Drug use: No     Medication list has been reviewed and updated.  Current Meds  Medication Sig   Ascorbic Acid (VITAMIN C WITH ROSE HIPS) 500 MG tablet Take 500 mg by mouth daily.   buPROPion (WELLBUTRIN XL) 300 MG 24 hr tablet TAKE 1 TABLET BY MOUTH EVERY DAY   Calcium Carbonate-Vit D-Min (CALCIUM 1200 PO) Take 1,200 mg by mouth.   Calcium-Magnesium-Zinc 333-133-5 MG TABS Take by mouth.   CHOLECALCIFEROL PO Take 1 tablet by mouth daily. 5000 iu   Elderberry 500 MG CAPS Take by mouth.   EPINEPHrine 0.3 mg/0.3 mL IJ SOAJ injection INJECT 0.3 MLS (0.3 MG TOTAL) INTO THE MUSCLE AS NEEDED (ANAPHYLAXIS). FOR UP TO 1 DOSE   hydrochlorothiazide (HYDRODIURIL) 25 MG tablet Take 1 tablet (25 mg total) by mouth daily.   Melatonin 10 MG TABS Take by mouth at bedtime as needed.   metoprolol tartrate 37.5 MG TABS Take 37.5 mg by mouth daily.   Misc Natural Products (TURMERIC CURCUMIN) CAPS Take 1 capsule by mouth daily. 1000 mg    Multiple Vitamin (MULTIVITAMIN WITH MINERALS) TABS tablet Take 1 tablet by mouth daily.   Omega-3 Fatty Acids (FISH OIL) 1000 MG CAPS Take by mouth.   psyllium (METAMUCIL) 58.6 % powder Take 1 packet by mouth as needed.   simvastatin (ZOCOR) 20 MG tablet TAKE 1 TABLET BY MOUTH EVERY DAY   Turmeric 1053 MG TABS Take by mouth.   zinc gluconate 50 MG tablet Take 50 mg by mouth daily. 2-3 times per week       09/15/2021   11:13 AM 07/10/2021   10:56 AM 03/13/2021   10:33 AM 01/10/2021   10:43 AM  GAD 7 : Generalized Anxiety Score  Nervous, Anxious, on Edge '1 1 1 ' 0  Control/stop worrying 0 0 1 1  Worry too much - different things 0 1 0 0  Trouble relaxing 0 0 0 0  Restless 0 0 0 0  Easily annoyed or irritable 0 0 0 0  Afraid - awful might happen 0 0 0 0  Total GAD 7 Score '1 2 2 1  ' Anxiety Difficulty Not difficult at all Not difficult at all Not difficult at all Not difficult at all       09/15/2021   11:13 AM 07/10/2021   10:56 AM 03/13/2021   10:33 AM  Depression screen PHQ 2/9  Decreased Interest 0 0 0  Down, Depressed, Hopeless 0 0 0  PHQ - 2 Score 0 0 0  Altered sleeping 0 0 1  Tired, decreased energy 0 0 0  Change in appetite 0 0 0  Feeling bad or failure about yourself  0 0 0  Trouble concentrating 0 0 0  Moving slowly or fidgety/restless 0 0 0  Suicidal thoughts 0 0 0  PHQ-9 Score 0 0 1  Difficult doing work/chores Not difficult at all Not difficult at all Not difficult at all    BP Readings from Last 3 Encounters:  09/15/21 128/60  07/31/21 (!) 173/71  07/10/21 (!) 148/78    Physical Exam Vitals and nursing note reviewed.  Constitutional:      General: She is not in acute distress.    Appearance: She is well-developed.  HENT:     Head: Normocephalic and atraumatic.  Cardiovascular:     Rate and Rhythm: Normal rate and regular rhythm.     Pulses: Normal pulses.     Heart sounds: No murmur heard. Pulmonary:     Effort: Pulmonary effort is normal. No  respiratory distress.     Breath sounds: No wheezing or rhonchi.  Musculoskeletal:     Cervical back: Normal range of motion.     Right lower leg: No edema.     Left lower leg: No edema.  Lymphadenopathy:     Cervical: No cervical adenopathy.  Skin:    General: Skin is warm and dry.     Capillary Refill: Capillary refill takes less than 2 seconds.     Findings: No rash.  Neurological:     Mental Status: She is alert and oriented to person, place, and time.  Psychiatric:        Mood and Affect: Mood normal.        Behavior: Behavior normal.     Wt Readings from Last 3 Encounters:  09/15/21 150 lb (68 kg)  07/31/21 146 lb (66.2 kg)  07/10/21 153 lb (69.4 kg)    BP 128/60   Pulse 63   Ht '5\' 4"'  (1.626 m)   Wt 150 lb (68 kg)   SpO2 97%   BMI 25.75 kg/m   Assessment and Plan: 1. Essential hypertension Clinically stable exam with well controlled BP. Tolerating medications without side effects at this time. Pt to continue current regimen and low sodium diet; benefits of regular exercise as able discussed.  2. Generalized anxiety disorder Doing fairly well on Bupropion  3. Hyperlipidemia, mixed - simvastatin (ZOCOR) 20 MG tablet; Take 1 tablet (20 mg total) by mouth daily.  Dispense: 100 tablet; Refill: 1  4. 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 Any errors are unintentional.  Halina Maidens, MD [facility]  09/15/2021

## 2021-10-05 DIAGNOSIS — L820 Inflamed seborrheic keratosis: Secondary | ICD-10-CM | POA: Diagnosis not present

## 2021-10-05 DIAGNOSIS — L538 Other specified erythematous conditions: Secondary | ICD-10-CM | POA: Diagnosis not present

## 2021-10-05 DIAGNOSIS — L309 Dermatitis, unspecified: Secondary | ICD-10-CM | POA: Diagnosis not present

## 2021-10-05 DIAGNOSIS — L821 Other seborrheic keratosis: Secondary | ICD-10-CM | POA: Diagnosis not present

## 2021-10-05 DIAGNOSIS — B078 Other viral warts: Secondary | ICD-10-CM | POA: Diagnosis not present

## 2021-10-11 ENCOUNTER — Encounter: Payer: Self-pay | Admitting: Gastroenterology

## 2021-10-11 NOTE — H&P (Signed)
Pre-Procedure H&P   Patient ID: Alexandra Watson is a 76 y.o. female.  Gastroenterology Provider: Annamaria Helling, DO  Referring Provider: Laurine Blazer, PA PCP: Glean Hess, MD  Date: 10/12/2021  HPI [name] is a 76 y.o. female who presents today for Colonoscopy for Colorectal cancer screening.  Patient with mild constipation for which she uses as needed Metamucil.  Also uses polyethylene glycol, but not daily. No melena or hematochezia.  Denies diarrhea.  She is status postcholecystectomy and appendectomy.  No family history of colorectal cancer or colon polyps.  Most recent CT scan without contrast demonstrating hiatal hernia.  Liver lesion that was previously shrinking remains unchanged.  Intrahepatic and extrahepatic dilation remain present consistent with postcholecystectomy state; LFTs within normal limits.  Known history of anemia of chronic disease.  Personal history of carcinoid lung tumor in remission.  Also with alpha gal allergy. Hemoglobin 10.7 MCV 89 platelets 237,000 ferritin 95 iron 60 saturation 31% creatinine 0.96  Patient last underwent colonoscopy in 2012 demonstrating internal hemorrhoids.   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    Past Surgical History:  Procedure Laterality Date   ANKLE ARTHROSCOPY WITH OPEN REDUCTION INTERNAL FIXATION (ORIF)  07/2015   tri-malleolar   BUNIONECTOMY Bilateral 2001, 2005   CHOLECYSTECTOMY     COLONOSCOPY  05/2010   normal   GANGLION CYST EXCISION Left    THORACOTOMY / DECORTICATION PARIETAL PLEURA Left 06/2017   [facility]   TOTAL KNEE ARTHROPLASTY Left 11/08/2020   VATS Sleeve lobectomy Left 09/2011   typical variant carcinoid     Family History No h/o GI disease or malignancy  Review of Systems  Constitutional:  Negative for activity change, appetite change, chills, diaphoresis, fatigue, fever and unexpected weight change.  HENT:  Negative for trouble swallowing and voice change.   Respiratory:  Negative for shortness of breath and wheezing.   Cardiovascular:  Negative for chest pain, palpitations and leg swelling.  Gastrointestinal:  Positive for constipation. Negative for abdominal distention, abdominal pain, anal bleeding, blood in stool, diarrhea, nausea, rectal pain and vomiting.  Musculoskeletal:  Negative for arthralgias and myalgias.  Skin:  Negative for color change and pallor.  Neurological:  Negative for dizziness, syncope and weakness.  Psychiatric/Behavioral:  Negative for confusion.   All other systems reviewed and are negative.    Medications No current facility-administered medications on file prior to encounter.   Current Outpatient Medications on File Prior to Encounter  Medication Sig Dispense Refill   Ascorbic Acid (VITAMIN C WITH ROSE HIPS) 500 MG tablet Take 500 mg by mouth daily.     buPROPion (WELLBUTRIN XL) 300 MG 24 hr tablet TAKE 1 TABLET BY MOUTH EVERY DAY 100 tablet 0   Calcium Carbonate-Vit D-Min (CALCIUM 1200 PO) Take 1,200 mg by mouth.     Calcium-Magnesium-Zinc 333-133-5 MG TABS Take by mouth.     CHOLECALCIFEROL PO Take 1 tablet by mouth daily. 5000 iu     Elderberry 500 MG CAPS Take by mouth.     hydrochlorothiazide (HYDRODIURIL) 25 MG tablet Take 1 tablet (25 mg total) by mouth daily. 100 tablet 1   metoprolol tartrate 37.5 MG TABS  Take 37.5 mg by mouth daily. 100 tablet 1   Multiple Vitamin (MULTIVITAMIN WITH MINERALS) TABS tablet Take 1 tablet by mouth daily.     Omega-3 Fatty Acids (FISH OIL) 1000 MG CAPS Take by mouth.     simvastatin (ZOCOR) 20 MG tablet Take 1 tablet (20 mg total) by mouth daily. 100 tablet 1   zinc gluconate 50 MG tablet Take 50 mg by mouth  daily. 2-3 times per week     EPINEPHrine 0.3 mg/0.3 mL IJ SOAJ injection INJECT 0.3 MLS (0.3 MG TOTAL) INTO THE MUSCLE AS NEEDED (ANAPHYLAXIS). FOR UP TO 1 DOSE     Melatonin 10 MG TABS Take by mouth at bedtime as needed.     Misc Natural Products (TURMERIC CURCUMIN) CAPS Take 1 capsule by mouth daily. 1000 mg     psyllium (METAMUCIL) 58.6 % powder Take 1 packet by mouth as needed.     Turmeric 1053 MG TABS Take by mouth.      Pertinent medications related to GI and procedure were reviewed by me with the patient prior to the procedure   Current Facility-Administered Medications:    0.9 %  sodium chloride infusion, , Intravenous, Continuous, Annamaria Helling, DO, Last Rate: 20 mL/hr at 10/12/21 1015, New Bag at 10/12/21 1015      Allergies  Allergen Reactions   Galactose Anaphylaxis, Anxiety, Diarrhea, Itching and Shortness Of Breath   Primidone Nausea Only   Iodinated Contrast Media Hives   Oxycodone Nausea And Vomiting and Nausea Only   Allergies were reviewed by me prior to the procedure  Objective   Body mass index is 24.72 kg/m. Vitals:   10/12/21 0953  BP: (!) 147/65  Pulse: 67  Resp: 16  Temp: (!) 97.3 F (36.3 C)  TempSrc: Temporal  SpO2: 100%  Weight: 65.3 kg  Height: 5\' 4"  (1.626 m)       Physical Exam Vitals and nursing note reviewed.  Constitutional:      General: She is not in acute distress.    Appearance: Normal appearance. She is not ill-appearing, toxic-appearing or diaphoretic.  HENT:     Head: Normocephalic and atraumatic.     Nose: Nose normal.     Mouth/Throat:     Mouth: Mucous membranes are moist.     Pharynx: Oropharynx is clear.  Eyes:     General: No scleral icterus.    Extraocular Movements: Extraocular movements intact.  Cardiovascular:     Rate and Rhythm: Normal rate and regular rhythm.     Heart sounds: Normal heart sounds. No murmur heard.    No friction rub. No gallop.  Pulmonary:     Effort: Pulmonary effort is  normal. No respiratory distress.     Breath sounds: Normal breath sounds. No wheezing, rhonchi or rales.  Abdominal:     General: Bowel sounds are normal. There is no distension.     Palpations: Abdomen is soft.     Tenderness: There is no abdominal tenderness. There is no guarding or rebound.  Musculoskeletal:     Cervical back: Neck supple.     Right lower leg: No edema.     Left lower leg: No edema.  Skin:    General: Skin is warm and dry.     Coloration: Skin is not jaundiced or pale.  Neurological:     General: No focal deficit present.     Mental Status: She is alert and oriented to person, place, and time. Mental status is  at baseline.  Psychiatric:        Mood and Affect: Mood normal.        Behavior: Behavior normal.        Thought Content: Thought content normal.        Judgment: Judgment normal.      Assessment:  [name] is a 76 y.o. female  who presents today for Colonoscopy for Colorectal cancer screening.  Plan:  Colonoscopy with possible intervention today  Colonoscopy with possible biopsy, control of bleeding, polypectomy, and interventions as necessary has been discussed with the patient/patient representative. Informed consent was obtained from the patient/patient representative after explaining the indication, nature, and risks of the procedure including but not limited to death, bleeding, perforation, missed neoplasm/lesions, cardiorespiratory compromise, and reaction to medications. Opportunity for questions was given and appropriate answers were provided. Patient/patient representative has verbalized understanding is amenable to undergoing the procedure.   Annamaria Helling, DO  [facility]  Portions of the record may have been created with voice recognition software. Occasional wrong-word or 'sound-a-like' substitutions may have occurred due to the inherent limitations of voice recognition software.  Read the chart  carefully and recognize, using context, where substitutions may have occurred.

## 2021-10-12 ENCOUNTER — Encounter: Payer: Self-pay | Admitting: Gastroenterology

## 2021-10-12 ENCOUNTER — Ambulatory Visit: Payer: Medicare HMO | Admitting: Anesthesiology

## 2021-10-12 ENCOUNTER — Encounter
Admission: RE | Disposition: A | Discharge status: Home / Self Care | Payer: Self-pay | Source: Ambulatory Visit | Attending: Gastroenterology

## 2021-10-12 ENCOUNTER — Ambulatory Visit
Admission: RE | Admit: 2021-10-12 | Discharge: 2021-10-12 | Disposition: A | Discharge status: Home / Self Care | Payer: Medicare HMO | Source: Ambulatory Visit | Attending: Gastroenterology | Admitting: Gastroenterology

## 2021-10-12 DIAGNOSIS — Z85118 Personal history of other malignant neoplasm of bronchus and lung: Secondary | ICD-10-CM | POA: Diagnosis not present

## 2021-10-12 DIAGNOSIS — F418 Other specified anxiety disorders: Secondary | ICD-10-CM | POA: Insufficient documentation

## 2021-10-12 DIAGNOSIS — E785 Hyperlipidemia, unspecified: Secondary | ICD-10-CM | POA: Diagnosis not present

## 2021-10-12 DIAGNOSIS — Z87891 Personal history of nicotine dependence: Secondary | ICD-10-CM | POA: Diagnosis not present

## 2021-10-12 DIAGNOSIS — Z79899 Other long term (current) drug therapy: Secondary | ICD-10-CM | POA: Diagnosis not present

## 2021-10-12 DIAGNOSIS — R69 Illness, unspecified: Secondary | ICD-10-CM | POA: Diagnosis not present

## 2021-10-12 DIAGNOSIS — I10 Essential (primary) hypertension: Secondary | ICD-10-CM | POA: Insufficient documentation

## 2021-10-12 DIAGNOSIS — D120 Benign neoplasm of cecum: Secondary | ICD-10-CM | POA: Diagnosis not present

## 2021-10-12 DIAGNOSIS — Z1211 Encounter for screening for malignant neoplasm of colon: Secondary | ICD-10-CM | POA: Insufficient documentation

## 2021-10-12 DIAGNOSIS — K219 Gastro-esophageal reflux disease without esophagitis: Secondary | ICD-10-CM | POA: Diagnosis not present

## 2021-10-12 DIAGNOSIS — K640 First degree hemorrhoids: Secondary | ICD-10-CM | POA: Insufficient documentation

## 2021-10-12 HISTORY — PX: COLONOSCOPY WITH PROPOFOL: SHX5780

## 2021-10-12 SURGERY — COLONOSCOPY WITH PROPOFOL
Anesthesia: General

## 2021-10-12 MED ORDER — SODIUM CHLORIDE 0.9 % IV SOLN: INTRAVENOUS | Status: DC

## 2021-10-12 MED ORDER — PROPOFOL 500 MG/50ML IV EMUL
INTRAVENOUS | Status: DC | PRN
  Administered 2021-10-12: 120 ug/kg/min via INTRAVENOUS

## 2021-10-12 NOTE — Transfer of Care (Signed)
Immediate Anesthesia Transfer of Care Note  Patient: Alexandra Watson  Procedure(s) Performed: COLONOSCOPY WITH PROPOFOL  Patient Location: PACU  Anesthesia Type:General  Level of Consciousness: awake and sedated  Airway & Oxygen Therapy: Patient Spontanous Breathing and Patient connected to nasal cannula oxygen  Post-op Assessment: Report given to RN and Post -op Vital signs reviewed and stable  Post vital signs: Reviewed and stable  Last Vitals:  Vitals Value Taken Time  BP    Temp    Pulse    Resp    SpO2      Last Pain:  Vitals:   10/12/21 0953  TempSrc: Temporal  PainSc: 0-No pain         Complications: No notable events documented.

## 2021-10-12 NOTE — Interval H&P Note (Signed)
History and Physical Interval Note: Preprocedure H&P from 10/12/21  was reviewed and there was no interval change after seeing and examining the patient.  Written consent was obtained from the patient after discussion of risks, benefits, and alternatives. Patient has consented to proceed with Colonoscopy with possible intervention   10/12/2021 10:41 AM  Alexandra Watson  has presented today for surgery, with the diagnosis of Z12.11 - Colon cancer screening.  The various methods of treatment have been discussed with the patient and family. After consideration of risks, benefits and other options for treatment, the patient has consented to  Procedure(s): COLONOSCOPY WITH PROPOFOL (N/A) as a surgical intervention.  The patient's history has been reviewed, patient examined, no change in status, stable for surgery.  I have reviewed the patient's chart and labs.  Questions were answered to the patient's satisfaction.     Annamaria Helling

## 2021-10-12 NOTE — Anesthesia Preprocedure Evaluation (Signed)
Anesthesia Evaluation  Patient identified by MRN, date of birth, ID band Patient awake    Reviewed: Allergy & Precautions, H&P , NPO status , Patient's Chart, lab work & pertinent test results, reviewed documented beta blocker date and time   History of Anesthesia Complications Negative for: history of anesthetic complications  Airway Mallampati: III   Neck ROM: full    Dental  (+) Poor Dentition   Pulmonary neg pulmonary ROS, neg COPD, former smoker,    Pulmonary exam normal        Cardiovascular Exercise Tolerance: Poor hypertension, On Medications (-) Past MI and (-) CABG negative cardio ROS Normal cardiovascular exam Rhythm:regular Rate:Normal     Neuro/Psych PSYCHIATRIC DISORDERS Anxiety Depression negative neurological ROS     GI/Hepatic Neg liver ROS, GERD  Medicated,  Endo/Other  negative endocrine ROS  Renal/GU negative Renal ROS  negative genitourinary   Musculoskeletal   Abdominal   Peds  Hematology  (+) Blood dyscrasia, anemia ,   Anesthesia Other Findings Past Medical History: No date: Anxiety No date: Depression 08/08/2015: Fracture of ankle, trimalleolar, left, closed No date: GERD (gastroesophageal reflux disease) No date: Hyperlipidemia No date: Hypertension 09/2011/2019: Lung cancer ([place])     Comment:  left lung broncial ca Past Surgical History: 07/2015: ANKLE ARTHROSCOPY WITH OPEN REDUCTION INTERNAL FIXATION  (ORIF)     Comment:  tri-malleolar 2001, 2005: BUNIONECTOMY; Bilateral No date: CHOLECYSTECTOMY 05/2010: COLONOSCOPY     Comment:  normal No date: GANGLION CYST EXCISION; Left 06/2017: THORACOTOMY / DECORTICATION PARIETAL PLEURA; Left     Comment:  [facility] 11/08/2020: TOTAL KNEE ARTHROPLASTY; Left 09/2011: VATS Sleeve lobectomy; Left     Comment:  typical variant carcinoid   Reproductive/Obstetrics negative OB ROS                             Anesthesia  Physical  Anesthesia Plan  ASA: 3  Anesthesia Plan: General   Post-op Pain Management: Minimal or no pain anticipated   Induction: Intravenous  PONV Risk Score and Plan: 3 and Propofol infusion, TIVA and Ondansetron  Airway Management Planned: Nasal Cannula  Additional Equipment: None  Intra-op Plan:   Post-operative Plan:   Informed Consent: I have reviewed the patients History and Physical, chart, labs and discussed the procedure including the risks, benefits and alternatives for the proposed anesthesia with the patient or authorized representative who has indicated his/her understanding and acceptance.     Dental Advisory Given  Plan Discussed with: CRNA  Anesthesia Plan Comments: (Discussed risks of anesthesia with patient, including possibility of difficulty with spontaneous ventilation under anesthesia necessitating airway intervention, PONV, and rare risks such as cardiac or respiratory or neurological events, and allergic reactions. Discussed the role of CRNA in patient's perioperative care. Patient understands.)        Anesthesia Quick Evaluation

## 2021-10-12 NOTE — Anesthesia Procedure Notes (Signed)
Date/Time: 10/12/2021 10:46 AM  Performed by: Donalda Ewings, CindyPre-anesthesia Checklist: Patient identified, Emergency Drugs available, Suction available, Patient being monitored and Timeout performed Patient Re-evaluated:Patient Re-evaluated prior to induction Oxygen Delivery Method: Nasal cannula Preoxygenation: Pre-oxygenation with 100% oxygen Induction Type: IV induction Placement Confirmation: positive ETCO2 and CO2 detector

## 2021-10-12 NOTE — Op Note (Signed)
[facility] Gastroenterology Patient Name: Alexandra Watson Procedure Date: 10/12/2021 10:43 AM MRN: [id number] Account #: [id number] Date of Birth: 1945-06-09 Admit Type: Outpatient Age: 76 Room: [facility] ENDO ROOM 1 Gender: Female Note Status: Finalized Instrument Name: Peds Colonoscope [id number] Procedure:             Colonoscopy Indications:           Screening for colorectal malignant neoplasm Providers:             Rueben Bash, DO Referring MD:          Halina Maidens, MD (Referring MD) Medicines:             Monitored Anesthesia Care Complications:         No immediate complications. Estimated blood loss:                         Minimal. Procedure:             Pre-Anesthesia Assessment:                        - Prior to the procedure, a History and Physical was                         performed, and patient medications and allergies were                         reviewed. The patient is competent. The risks and                         benefits of the procedure and the sedation options and                         risks were discussed with the patient. All questions                         were answered and informed consent was obtained.                         Patient identification and proposed procedure were                         verified by the physician, the nurse, the anesthetist                         and the technician in the endoscopy suite. Mental                         Status Examination: alert and oriented. Airway                         Examination: normal oropharyngeal airway and neck                         mobility. Respiratory Examination: clear to                         auscultation. CV Examination: RRR, no murmurs, no S3  or S4. Prophylactic Antibiotics: The patient does not                         require prophylactic antibiotics. Prior                         Anticoagulants: The patient has taken no  previous                         anticoagulant or antiplatelet agents. ASA Grade                         Assessment: III - A patient with severe systemic                         disease. After reviewing the risks and benefits, the                         patient was deemed in satisfactory condition to                         undergo the procedure. The anesthesia plan was to use                         monitored anesthesia care (MAC). Immediately prior to                         administration of medications, the patient was                         re-assessed for adequacy to receive sedatives. The                         heart rate, respiratory rate, oxygen saturations,                         blood pressure, adequacy of pulmonary ventilation, and                         response to care were monitored throughout the                         procedure. The physical status of the patient was                         re-assessed after the procedure.                        After obtaining informed consent, the colonoscope was                         passed under direct vision. Throughout the procedure,                         the patient's blood pressure, pulse, and oxygen                         saturations were monitored continuously. The  Colonoscope was introduced through the anus and                         advanced to the the cecum, identified by appendiceal                         orifice and ileocecal valve. The colonoscopy was                         performed without difficulty. The patient tolerated                         the procedure well. The quality of the bowel                         preparation was evaluated using the BBPS [facility] Bowel                         Preparation Scale) with scores of: Right Colon = 2                         (minor amount of residual staining, small fragments of                         stool and/or opaque liquid, but mucosa seen  well),                         Transverse Colon = 3 (entire mucosa seen well with no                         residual staining, small fragments of stool or opaque                         liquid) and Left Colon = 2 (minor amount of residual                         staining, small fragments of stool and/or opaque                         liquid, but mucosa seen well). The total BBPS score                         equals 7. The quality of the bowel preparation was                         good. The ileocecal valve, appendiceal orifice, and                         rectum were photographed. Findings:      The perianal and digital rectal examinations were normal. Pertinent       negatives include normal sphincter tone.      A 3 to 4 mm polyp was found in the cecum. The polyp was sessile. The       polyp was removed with a cold snare. Resection and retrieval were       complete. Estimated blood loss was minimal.      food particulate  and semi-liquid stool was found in the entire colon,       interfering with visualization. Lavage of the area was performed using a       moderate amount of sterile water, resulting in clearance with adequate       visualization. Estimated blood loss: none.      Non-bleeding internal hemorrhoids were found during retroflexion. The       hemorrhoids were Grade I (internal hemorrhoids that do not prolapse).       Estimated blood loss: none.      The exam was otherwise without abnormality on direct and retroflexion       views. Impression:            - One 3 to 4 mm polyp in the cecum, removed with a                         cold snare. Resected and retrieved.                        - Stool in the entire examined colon.                        - Non-bleeding internal hemorrhoids.                        - The examination was otherwise normal on direct and                         retroflexion views. Recommendation:        - Patient has a contact number available for                          emergencies. The signs and symptoms of potential                         delayed complications were discussed with the patient.                         Return to normal activities tomorrow. Written                         discharge instructions were provided to the patient.                        - Resume previous diet.                        - Continue present medications.                        - Await pathology results.                        - No aspirin, ibuprofen, naproxen, or other                         non-steroidal anti-inflammatory drugs for 5 days after                         polyp removal.                        -  Repeat colonoscopy for surveillance based on                         pathology results.                        - May not need repeat colonscopy pending pathology as                         pt will be above screening age if on 7-10 years.                         Further recommendations will be made pending pathology                         and letter to be sent.                        - Return to GI clinic as previously scheduled.                        - Continue with daily capful of polyethylene glycol                         laxative (miralax)                        - The findings and recommendations were discussed with                         the patient. Procedure Code(s):     --- Professional ---                        [id number], Colonoscopy, flexible; with removal of                         tumor(s), polyp(s), or other lesion(s) by snare                         technique Diagnosis Code(s):     --- Professional ---                        Z12.11, Encounter for screening for malignant neoplasm                         of colon                        K63.5, Polyp of colon                        K64.0, First degree hemorrhoids CPT copyright 2019 [employer]. All rights reserved. The codes documented in this report are preliminary  and upon coder review may  be revised to meet current compliance requirements. Attending Participation:      I personally performed the entire procedure. Volney American, DO Annamaria Helling DO, DO 10/12/2021 11:22:25 AM This report has been signed electronically. Number of Addenda: 0 Note Initiated On: 10/12/2021 10:43 AM Scope Withdrawal Time: 0 hours 16 minutes 44 seconds  Total Procedure Duration:  0 hours 24 minutes 44 seconds  Estimated Blood Loss:  Estimated blood loss was minimal.      [facility]

## 2021-10-13 ENCOUNTER — Encounter: Payer: Self-pay | Admitting: Gastroenterology

## 2021-10-13 LAB — SURGICAL PATHOLOGY

## 2021-10-13 NOTE — Anesthesia Postprocedure Evaluation (Signed)
Anesthesia Post Note  Patient: Alexandra Watson  Procedure(s) Performed: COLONOSCOPY WITH PROPOFOL  Patient location during evaluation: Endoscopy Anesthesia Type: General Level of consciousness: awake and alert Pain management: pain level controlled Vital Signs Assessment: post-procedure vital signs reviewed and stable Respiratory status: spontaneous breathing, nonlabored ventilation, respiratory function stable and patient connected to nasal cannula oxygen Cardiovascular status: blood pressure returned to baseline and stable Postop Assessment: no apparent nausea or vomiting Anesthetic complications: no   No notable events documented.   Last Vitals:  Vitals:   10/12/21 1128 10/12/21 1147  BP: (!) 109/53 (!) 118/58  Pulse: (!) 54 (!) 55  Resp: (!) 22   Temp:    SpO2: 100% 100%    Last Pain:  Vitals:   10/12/21 1147  TempSrc:   PainSc: 0-No pain                 Dimas Millin

## 2021-10-24 ENCOUNTER — Other Ambulatory Visit: Payer: Self-pay | Admitting: Internal Medicine

## 2021-10-24 DIAGNOSIS — F411 Generalized anxiety disorder: Secondary | ICD-10-CM

## 2021-10-25 NOTE — Telephone Encounter (Signed)
Requested Prescriptions  Pending Prescriptions Disp Refills  . buPROPion (WELLBUTRIN XL) 300 MG 24 hr tablet [Pharmacy Med Name: BUPROPION HCL XL 300 MG TABLET] 100 tablet 0    Sig: TAKE 1 TABLET BY MOUTH EVERY DAY     Psychiatry: Antidepressants - bupropion Passed - 10/24/2021  3:10 PM      Passed - Cr in normal range and within 360 days    Creatinine  Date Value Ref Range Status  09/17/2011 0.87 0.60 - 1.30 mg/dL Final   Creatinine, Ser  Date Value Ref Range Status  01/10/2021 0.96 0.57 - 1.00 mg/dL Final         Passed - AST in normal range and within 360 days    AST  Date Value Ref Range Status  01/10/2021 22 0 - 40 IU/L Final   SGOT(AST)  Date Value Ref Range Status  09/17/2011 21 15 - 37 Unit/L Final         Passed - ALT in normal range and within 360 days    ALT  Date Value Ref Range Status  01/10/2021 17 0 - 32 IU/L Final   SGPT (ALT)  Date Value Ref Range Status  09/17/2011 32 U/L Final    Comment:    12-78 NOTE: NEW REFERENCE RANGE 01/19/2011          Passed - Last BP in normal range    BP Readings from Last 1 Encounters:  10/12/21 (!) 118/58         Passed - Valid encounter within last 6 months    Recent Outpatient Visits          1 month ago Essential hypertension   [place] [facility], Jesse Sans, MD   3 months ago Essential hypertension   [place] [facility] at [facility], Jesse Sans, MD   7 months ago Benign essential tremor   [place] [facility] at [facility], Jesse Sans, MD   9 months ago Essential hypertension   [place] [facility] at [facility], Jesse Sans, MD   1 year ago Arthritis of knee, left   [facility] at [facility], Jesse Sans, MD      Future Appointments            In 2 months [facility], Jesse Sans, MD [facility]  and Sports Medicine at [facility], [facility]

## 2021-11-03 ENCOUNTER — Telehealth: Payer: Self-pay | Admitting: Internal Medicine

## 2021-11-03 NOTE — Telephone Encounter (Signed)
Copied from [place] [id number]. Topic: Medicare AWV >> Nov 03, 2021 10:50 AM Jae Dire wrote: Reason for CRM:  Left message for patient to call back and schedule Medicare Annual Wellness Visit (AWV) in office.   If unable to come into the office for AWV,  please offer to do virtually or by telephone.  Last AWV: 11/12/2020  Please schedule at anytime with [facility] Health Advisor.      30 minute appointment for Virtual or phone 45 minute appointment for in office or Initial virtual/phone  Any questions, please call me at [phone]

## 2021-11-03 NOTE — Telephone Encounter (Signed)
Pt has been scheduled.  °

## 2021-11-22 ENCOUNTER — Ambulatory Visit (INDEPENDENT_AMBULATORY_CARE_PROVIDER_SITE_OTHER): Payer: Medicare HMO

## 2021-11-22 VITALS — Ht 64.0 in | Wt 144.0 lb

## 2021-11-22 DIAGNOSIS — Z0000 Encounter for general adult medical examination without abnormal findings: Secondary | ICD-10-CM | POA: Diagnosis not present

## 2021-11-22 NOTE — Progress Notes (Signed)
Subjective:   Alexandra Watson is a 76 y.o. female who presents for Medicare Annual (Subsequent) preventive examination.  I connected with  [name] on 11/22/21 by a audio enabled telemedicine application and verified that I am speaking with the correct person using two identifiers.  Patient Location: Home  Provider Location: Office/Clinic  I discussed the limitations of evaluation and management by telemedicine. The patient expressed understanding and agreed to proceed.   Review of Systems    Defer to PCP Cardiac Risk Factors include: advanced age (>57men, >76 women)     Objective:    Today's Vitals   11/22/21 0920 11/22/21 0921  Weight: 144 lb (65.3 kg)   Height: 5\' 4"  (1.626 m)   PainSc: 0-No pain 0-No pain   Body mass index is 24.72 kg/m.     11/22/2021    9:22 AM 10/12/2021    9:57 AM 07/31/2021    7:02 AM 11/12/2020    3:16 PM 07/15/2019   11:03 AM 07/14/2018    1:40 PM 07/10/2017   11:03 AM  Advanced Directives  Does Patient Have a Medical Advance Directive? Yes Yes No Yes Yes Yes Yes  Type of Advance Directive Living will   [place];Living will [place];Living will Living will;Healthcare Power of [place];Living will  Does patient want to make changes to medical advance directive?    No - Patient declined Yes (MAU/Ambulatory/Procedural Areas - Information given)    Copy of [place] in Chart?    No - copy requested  No - copy requested No - copy requested  Would patient like information on creating a medical advance directive?   No - Patient declined        Current Medications (verified) Outpatient Encounter Medications as of 11/22/2021  Medication Sig   Ascorbic Acid (VITAMIN C WITH ROSE HIPS) 500 MG tablet Take 500 mg by mouth daily.   buPROPion (WELLBUTRIN XL) 300 MG 24 hr tablet TAKE 1 TABLET BY MOUTH EVERY DAY   Calcium Carbonate-Vit D-Min (CALCIUM 1200 PO) Take  1,200 mg by mouth.   Calcium-Magnesium-Zinc 333-133-5 MG TABS Take by mouth.   CHOLECALCIFEROL PO Take 1 tablet by mouth daily. 5000 iu   Elderberry 500 MG CAPS Take by mouth.   EPINEPHrine 0.3 mg/0.3 mL IJ SOAJ injection INJECT 0.3 MLS (0.3 MG TOTAL) INTO THE MUSCLE AS NEEDED (ANAPHYLAXIS). FOR UP TO 1 DOSE   hydrochlorothiazide (HYDRODIURIL) 25 MG tablet Take 1 tablet (25 mg total) by mouth daily.   Melatonin 10 MG TABS Take by mouth at bedtime as needed.   metoprolol tartrate 37.5 MG TABS Take 37.5 mg by mouth daily.   Misc Natural Products (TURMERIC CURCUMIN) CAPS Take 1 capsule by mouth daily. 1000 mg   Multiple Vitamin (MULTIVITAMIN WITH MINERALS) TABS tablet Take 1 tablet by mouth daily.   Omega-3 Fatty Acids (FISH OIL) 1000 MG CAPS Take by mouth.   psyllium (METAMUCIL) 58.6 % powder Take 1 packet by mouth as needed.   simvastatin (ZOCOR) 20 MG tablet Take 1 tablet (20 mg total) by mouth daily.   Turmeric 1053 MG TABS Take by mouth.   zinc gluconate 50 MG tablet Take 50 mg by mouth daily. 2-3 times per week   No facility-administered encounter medications on file as of 11/22/2021.    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   Past Surgical History:  Procedure Laterality Date   ANKLE ARTHROSCOPY WITH OPEN REDUCTION INTERNAL FIXATION (ORIF)  07/2015   tri-malleolar   BUNIONECTOMY Bilateral 2001, 2005   CHOLECYSTECTOMY     COLONOSCOPY  05/2010   normal   COLONOSCOPY WITH PROPOFOL N/A 10/12/2021   Procedure: COLONOSCOPY WITH PROPOFOL;  Surgeon: Annamaria Helling, DO;  Location: [facility] ENDOSCOPY;  Service: Gastroenterology;   Laterality: N/A;   GANGLION CYST EXCISION Left    THORACOTOMY / DECORTICATION PARIETAL PLEURA Left 06/2017   [facility]   TOTAL KNEE ARTHROPLASTY Left 11/08/2020   VATS Sleeve lobectomy Left 09/2011   typical variant carcinoid   Family History  Problem Relation Age of Onset   Stroke Mother    Heart disease Mother    Heart attack Father    Heart disease Father    CAD Brother    Heart disease Brother    Heart attack Brother    Heart disease Brother    Breast cancer Neg Hx    Social History   Socioeconomic History   Marital status: Married    Spouse name: Cherice Glennie   Number of children: 1   Years of education: some college   Highest education level: 12th grade  Occupational History   Occupation: Retired  Tobacco Use   Smoking status: Former    Packs/day: 0.25    Years: 20.00    Total pack years: 5.00    Types: Cigarettes    Quit date: 1985    Years since quitting: 38.7   Smokeless tobacco: Never   Tobacco comments:    smoking cessation materials not required  Vaping Use   Vaping Use: Never used  Substance and Sexual Activity   Alcohol use: Not Currently    Alcohol/week: 1.0 standard drink of alcohol    Types: 1 Glasses of wine per week    Comment: stopped drinking wine 2 years   Drug use: No   Sexual activity: Not Currently  Other Topics Concern   Not on file  Social History Narrative   Not on file   Social Determinants of Health   Financial Resource Strain: Low Risk  (11/22/2021)   Overall Financial Resource Strain (CARDIA)    Difficulty of Paying Living Expenses: Not hard at all  Food Insecurity: No Food Insecurity (11/22/2021)   Hunger Vital Sign    Worried About Running Out of Food in the Last Year: Never true    Ran Out of Food in the Last Year: Never true  Transportation Needs: No Transportation Needs (11/22/2021)   PRAPARE - Hydrologist (Medical): No    Lack of Transportation (Non-Medical): No  Physical Activity:  Insufficiently Active (11/22/2021)   Exercise Vital Sign    Days of Exercise per Week: 2 days    Minutes of Exercise per Session: 30 min  Stress: No Stress Concern Present (11/22/2021)   [place]    Feeling of Stress : Only a little  Social Connections: Socially Integrated (11/22/2021)   Social Connection and Isolation Panel [NHANES]    Frequency of Communication with Friends and Family: More than three times a week  Frequency of Social Gatherings with Friends and Family: Twice a week    Attends Religious Services: More than 4 times per year    Active Member of [employer] or Organizations: Yes    Attends Music therapist: More than 4 times per year    Marital Status: Married    Tobacco Counseling Counseling given: Not Answered Tobacco comments: smoking cessation materials not required   Clinical Intake:  Pre-visit preparation completed: Yes  Pain : No/denies pain Pain Score: 0-No pain     BMI - recorded: 24.72 Nutritional Risks: None Diabetes: No  How often do you need to have someone help you when you read instructions, pamphlets, or other written materials from your doctor or pharmacy?: (P) 1 - Never  Diabetic? No.     Information entered by :: Wyatt Haste, [place] of Daily Living    11/22/2021    9:23 AM 11/18/2021    1:05 PM  In your present state of health, do you have any difficulty performing the following activities:  Hearing? 0 0  Vision? 0 0  Difficulty concentrating or making decisions? 0 0  Walking or climbing stairs? 0 0  Dressing or bathing? 0 0  Doing errands, shopping? 0 0  Preparing Food and eating ? N N  Using the Toilet? N   In the past six months, have you accidently leaked urine? N N  Do you have problems with loss of bowel control? N N  Managing your Medications? N N  Managing your Finances? N N  Housekeeping or managing your Housekeeping? N N     Patient Care Team: Glean Hess, MD as PCP - General (Internal Medicine) Lerry Paterson, MD as Referring Physician (Surgical Oncology) Dr. Geryl Councilman (Ophthalmology) Manya Silvas, MD (Inactive) (Gastroenterology) Arna Medici, MD as Referring Physician (Allergy and Immunology) Nat Math, MD as Referring Physician (Oncology) Dallie Dad as Physician Assistant (Dermatology)  Indicate any recent Medical Services you may have received from other than [facility] providers in the past year (date may be approximate).     Assessment:   This is a routine wellness examination for [place].  Hearing/Vision screen Hearing Screening - Comments:: No concerns. Vision Screening - Comments:: Wears reading glasses.  Dietary issues and exercise activities discussed:     Goals Addressed   None   Depression Screen    11/22/2021    9:22 AM 09/15/2021   11:13 AM 07/10/2021   10:56 AM 03/13/2021   10:33 AM 01/10/2021   10:43 AM 11/12/2020    3:05 PM 07/22/2020    3:14 PM  PHQ 2/9 Scores  PHQ - 2 Score 0 0 0 0 0 0 0  PHQ- 9 Score 0 0 0 1 0  0    Fall Risk    11/22/2021    9:23 AM 11/18/2021    1:05 PM 09/15/2021   11:13 AM 07/10/2021   10:56 AM 03/13/2021   10:33 AM  Fall Risk   Falls in the past year? 0 0 0 0 0  Number falls in past yr: 0 0 0 0 0  Injury with Fall? 0 0 0 0 0  Risk for fall due to : No Fall Risks  No Fall Risks No Fall Risks No Fall Risks  Follow up Falls evaluation completed  Falls evaluation completed Falls evaluation completed Falls evaluation completed    FALL RISK PREVENTION PERTAINING TO THE HOME:  Any stairs in or around the home? Yes  If so, are there any without handrails? Yes  Home free of loose throw rugs in walkways, pet beds, electrical cords, etc? Yes  Adequate lighting in your home to reduce risk of falls? Yes   ASSISTIVE DEVICES UTILIZED TO PREVENT FALLS:  Life alert? No  Use of a cane, walker or w/c? No  Grab bars in the  bathroom? Yes  Shower chair or bench in shower? No  Elevated toilet seat or a handicapped toilet? No    Cognitive Function:        11/22/2021    9:23 AM 11/12/2020    3:11 PM 07/14/2018    1:43 PM 07/10/2017   11:08 AM 07/06/2016    9:44 AM  6CIT Screen  What Year? 0 points 0 points 0 points 0 points 0 points  What month? 0 points 0 points 0 points 0 points 0 points  What time? 0 points 0 points 0 points 0 points 0 points  Count back from 20 0 points 0 points 0 points 0 points 0 points  Months in reverse 0 points 0 points 0 points 0 points 0 points  Repeat phrase 2 points  0 points 0 points 0 points  Total Score 2 points  0 points 0 points 0 points    Immunizations Immunization History  Administered Date(s) Administered   Fluad Quad(high Dose 65+) 12/11/2018   Influenza, High Dose Seasonal PF 11/25/2014, 11/24/2015, 11/22/2016, 12/17/2017, 12/23/2020   Influenza-Unspecified 11/30/2019   PFIZER(Purple Top)SARS-COV-2 Vaccination 04/18/2019, 05/10/2019, 01/02/2020, 03/04/2021   Pneumococcal Conjugate-13 02/02/2013   Pneumococcal Polysaccharide-23 11/16/2010, 02/03/2014   Tdap 06/10/2009   Zoster Recombinat (Shingrix) 01/25/2018, 10/25/2018   Zoster, Live 01/25/2018    TDAP status: Due, Education has been provided regarding the importance of this vaccine. Advised may receive this vaccine at local pharmacy or Health Dept. Aware to provide a copy of the vaccination record if obtained from local pharmacy or Health Dept. Verbalized acceptance and understanding.  Flu Vaccine status: Due, Education has been provided regarding the importance of this vaccine. Advised may receive this vaccine at local pharmacy or Health Dept. Aware to provide a copy of the vaccination record if obtained from local pharmacy or Health Dept. Verbalized acceptance and understanding.  Pneumococcal vaccine status: Up to date  Covid-19 vaccine status: Completed vaccines  Qualifies for Shingles Vaccine? Yes    Zostavax completed No   Shingrix Completed?: Yes  Screening Tests Health Maintenance  Topic Date Due   TETANUS/TDAP  06/11/2019   COVID-19 Vaccine (5 - Pfizer risk series) 12/08/2021 (Originally 04/29/2021)   INFLUENZA VACCINE  05/27/2022 (Originally 09/26/2021)   MAMMOGRAM  02/22/2022   Pneumonia Vaccine 51+ Years old  Completed   DEXA SCAN  Completed   Zoster Vaccines- Shingrix  Completed   Hepatitis C Screening  Addressed   HPV VACCINES  Aged Out   COLONOSCOPY (Pts 45-54yrs Insurance coverage will need to be confirmed)  Discontinued    Health Maintenance  Health Maintenance Due  Topic Date Due   TETANUS/TDAP  06/11/2019    Colorectal cancer screening: Type of screening: Colonoscopy. Completed 10/12/2021. Repeat every 0 years. Patient has aged out of this screening.  Mammogram status: No longer required due to age.  Bone Density status: Completed 06/24/2017. Results reflect: Bone density results: OSTEOPENIA. Repeat every 3 years.  Lung Cancer Screening: (Low Dose CT Chest recommended if Age 68-80 years, 30 pack-year currently smoking OR have quit w/in 15years.) does not qualify.   Lung Cancer Screening Referral: N/A  Additional  Screening:  Hepatitis C Screening: does qualify; Completed 02/03/2014  Vision Screening: Recommended annual ophthalmology exams for early detection of glaucoma and other disorders of the eye. Is the patient up to date with their annual eye exam?  Yes  Who is the provider or what is the name of the office in which the patient attends annual eye exams? [place] If pt is not established with a provider, would they like to be referred to a provider to establish care?  N/A .   Dental Screening: Recommended annual dental exams for proper oral hygiene  Community Resource Referral / Chronic Care Management: CRR required this visit?  No   CCM required this visit?  No      Plan:     I have personally reviewed and noted the following in  the patient's chart:   Medical and social history Use of alcohol, tobacco or illicit drugs  Current medications and supplements including opioid prescriptions. Patient is not currently taking opioid prescriptions. Functional ability and status Nutritional status Physical activity Advanced directives List of other physicians Hospitalizations, surgeries, and ER visits in previous 12 months Vitals Screenings to include cognitive, depression, and falls Referrals and appointments  In addition, I have reviewed and discussed with patient certain preventive protocols, quality metrics, and best practice recommendations. A written personalized care plan for preventive services as well as general preventive health recommendations were provided to patient.     Clista Bernhardt, [place]   11/22/2021    [name] , Thank you for taking time to come for your Medicare Wellness Visit. I appreciate your ongoing commitment to your health goals. Please review the following plan we discussed and let me know if I can assist you in the future.   These are the goals we discussed:  Goals      DIET - INCREASE WATER INTAKE     Recommend to drink at least 6-8 8oz glasses of water per day.        This is a list of the screening recommended for you and due dates:  Health Maintenance  Topic Date Due   Tetanus Vaccine  06/11/2019   COVID-19 Vaccine (5 - Pfizer risk series) 12/08/2021*   Flu Shot  05/27/2022*   Mammogram  02/22/2022   Pneumonia Vaccine  Completed   DEXA scan (bone density measurement)  Completed   Zoster (Shingles) Vaccine  Completed   Hepatitis C Screening: USPSTF Recommendation to screen - Ages 65-79 yo.  Addressed   HPV Vaccine  Aged Out   Colon Cancer Screening  Discontinued  *Topic was postponed. The date shown is not the original due date.      Nurse Notes: None.

## 2021-11-23 ENCOUNTER — Other Ambulatory Visit: Payer: Self-pay | Admitting: Internal Medicine

## 2021-11-23 DIAGNOSIS — F411 Generalized anxiety disorder: Secondary | ICD-10-CM

## 2021-11-23 NOTE — Telephone Encounter (Signed)
Requested medications are due for refill today.  no  Requested medications are on the active medications list.  yes  Last refill. 10/25/2021 #100 0 rf  Future visit scheduled.   yes  Notes to clinic.  Pharmacy comment: REQUEST FOR 90 DAYS PRESCRIPTION. DX Code Needed.    Requested Prescriptions  Pending Prescriptions Disp Refills   buPROPion (WELLBUTRIN XL) 300 MG 24 hr tablet [Pharmacy Med Name: BUPROPION HCL XL 300 MG TABLET] 90 tablet 2    Sig: TAKE 1 TABLET BY MOUTH EVERY DAY     Psychiatry: Antidepressants - bupropion Passed - 11/23/2021  1:32 PM      Passed - Cr in normal range and within 360 days    Creatinine  Date Value Ref Range Status  09/17/2011 0.87 0.60 - 1.30 mg/dL Final   Creatinine, Ser  Date Value Ref Range Status  01/10/2021 0.96 0.57 - 1.00 mg/dL Final         Passed - AST in normal range and within 360 days    AST  Date Value Ref Range Status  01/10/2021 22 0 - 40 IU/L Final   SGOT(AST)  Date Value Ref Range Status  09/17/2011 21 15 - 37 Unit/L Final         Passed - ALT in normal range and within 360 days    ALT  Date Value Ref Range Status  01/10/2021 17 0 - 32 IU/L Final   SGPT (ALT)  Date Value Ref Range Status  09/17/2011 32 U/L Final    Comment:    12-78 NOTE: NEW REFERENCE RANGE 01/19/2011          Passed - Last BP in normal range    BP Readings from Last 1 Encounters:  10/12/21 (!) 118/58         Passed - Valid encounter within last 6 months    Recent Outpatient Visits           2 months ago Essential hypertension   [place] [facility], Jesse Sans, MD   4 months ago Essential hypertension   [place] [facility] at [facility], Jesse Sans, MD   8 months ago Benign essential tremor   [place] [facility] at [facility], Jesse Sans, MD   10 months ago Essential hypertension   [place] [facility] at [facility], Jesse Sans, MD   1 year ago Arthritis of knee, left   [facility] at [facility], Jesse Sans, MD       Future Appointments             In 1 month [facility] Mel[facility], Jesse Sans, MD [facility] at [facility]

## 2021-11-27 DIAGNOSIS — L280 Lichen simplex chronicus: Secondary | ICD-10-CM | POA: Diagnosis not present

## 2021-11-27 DIAGNOSIS — L820 Inflamed seborrheic keratosis: Secondary | ICD-10-CM | POA: Diagnosis not present

## 2021-11-27 DIAGNOSIS — L298 Other pruritus: Secondary | ICD-10-CM | POA: Diagnosis not present

## 2021-11-27 DIAGNOSIS — L538 Other specified erythematous conditions: Secondary | ICD-10-CM | POA: Diagnosis not present

## 2021-11-27 DIAGNOSIS — L810 Postinflammatory hyperpigmentation: Secondary | ICD-10-CM | POA: Diagnosis not present

## 2021-12-07 ENCOUNTER — Encounter: Payer: Self-pay | Admitting: Internal Medicine

## 2021-12-07 ENCOUNTER — Other Ambulatory Visit: Payer: Self-pay | Admitting: Internal Medicine

## 2021-12-07 ENCOUNTER — Ambulatory Visit (INDEPENDENT_AMBULATORY_CARE_PROVIDER_SITE_OTHER): Payer: Medicare HMO | Admitting: Internal Medicine

## 2021-12-07 VITALS — BP 136/72 | HR 63 | Temp 98.0°F | Ht 64.0 in | Wt 149.0 lb

## 2021-12-07 DIAGNOSIS — R399 Unspecified symptoms and signs involving the genitourinary system: Secondary | ICD-10-CM

## 2021-12-07 LAB — POCT URINALYSIS DIPSTICK
Bilirubin, UA: NEGATIVE
Glucose, UA: NEGATIVE
Ketones, UA: NEGATIVE
Nitrite, UA: POSITIVE
Protein, UA: NEGATIVE
Spec Grav, UA: <=1.005 — AB (ref 1.010–1.025)
Urobilinogen, UA: 0.2 E.U./dL
pH, UA: 6 (ref 5.0–8.0)

## 2021-12-07 MED ORDER — SULFAMETHOXAZOLE-TRIMETHOPRIM 800-160 MG PO TABS: 1.0000 | ORAL_TABLET | Freq: Two times a day (BID) | ORAL | 0 refills | Status: AC

## 2021-12-07 NOTE — Progress Notes (Signed)
Date:  12/07/2021   Name:  Alexandra Watson   DOB:  [birth year]   MRN:  [id number]   Chief Complaint: Urinary Tract Infection  Urinary Tract Infection  This is a new problem. The current episode started in the past 7 days. The problem has been waxing and waning. The quality of the pain is described as burning and stabbing. The pain is moderate. There has been no fever. Associated symptoms include flank pain, frequency, hesitancy and urgency. Pertinent negatives include no chills, nausea or vomiting. Treatments tried: AZO. The treatment provided mild relief.    Lab Results  Component Value Date   NA 137 01/10/2021   K 4.3 01/10/2021   CO2 27 01/10/2021   GLUCOSE 85 01/10/2021   BUN 16 01/10/2021   CREATININE 0.96 01/10/2021   CALCIUM 9.8 01/10/2021   EGFR 61 01/10/2021   GFRNONAA 80 01/12/2020   Lab Results  Component Value Date   CHOL 160 01/10/2021   HDL 49 01/10/2021   LDLCALC 85 01/10/2021   TRIG 149 01/10/2021   CHOLHDL 3.3 01/10/2021   Lab Results  Component Value Date   TSH 1.720 01/10/2021   No results found for: "HGBA1C" Lab Results  Component Value Date   WBC 3.6 07/10/2021   HGB 10.7 (L) 07/10/2021   HCT 32.1 (L) 07/10/2021   MCV 89 07/10/2021   PLT 237 07/10/2021   Lab Results  Component Value Date   ALT 17 01/10/2021   AST 22 01/10/2021   ALKPHOS 93 01/10/2021   BILITOT 0.2 01/10/2021   No results found for: "25OHVITD2", "25OHVITD3", "VD25OH"   Review of Systems  Constitutional:  Negative for chills, diaphoresis, fatigue and fever.  Cardiovascular:  Negative for chest pain.  Gastrointestinal:  Negative for diarrhea, nausea and vomiting.  Genitourinary:  Positive for flank pain, frequency, hesitancy and urgency.  Psychiatric/Behavioral:  Negative for dysphoric mood and sleep disturbance. The patient is not nervous/anxious.     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   Primary osteoarthritis of left knee 09/17/2018   Generalized anxiety disorder 11/02/2017   Weight loss, abnormal 10/11/2017   Neuropathy 07/06/2016   SUI (stress urinary incontinence, female) 11/11/2015   Incomplete emptying of bladder 11/11/2015   Recurrent UTI 11/10/2015   Arthritis of knee, left 07/01/2015   Insomnia 06/30/2015   Atrophic vaginitis 06/30/2015   Gastroesophageal reflux disease 06/30/2015   Osteopenia 05/20/2015   Anemia 09/14/2012   Constipation 08/27/2012   Essential hypertension 10/04/2011   Hyperlipidemia, mixed 10/04/2011    Allergies  Allergen Reactions   Galactose Anaphylaxis, Anxiety, Diarrhea, Itching and Shortness Of Breath   Primidone Nausea Only   Iodinated Contrast Media Hives   Oxycodone Nausea And Vomiting and Nausea Only    Past Surgical History:  Procedure Laterality Date   ANKLE ARTHROSCOPY WITH OPEN REDUCTION INTERNAL FIXATION (ORIF)  07/2015   tri-malleolar   BUNIONECTOMY Bilateral 2001, 2005   CHOLECYSTECTOMY     COLONOSCOPY  05/2010   normal   COLONOSCOPY WITH PROPOFOL N/A 10/12/2021   Procedure: COLONOSCOPY WITH PROPOFOL;  Surgeon: Annamaria Helling, DO;  Location: [facility] ENDOSCOPY;  Service: Gastroenterology;  Laterality: N/A;   GANGLION CYST EXCISION Left    THORACOTOMY / DECORTICATION PARIETAL PLEURA Left 06/2017   [facility]   TOTAL KNEE ARTHROPLASTY Left  11/08/2020   VATS Sleeve lobectomy Left 09/2011   typical variant carcinoid    Social History   Tobacco Use   Smoking status: Former    Packs/day: 0.25    Years: 20.00    Total pack years: 5.00    Types: Cigarettes    Quit date: 1985    Years since quitting: 38.8   Smokeless tobacco: Never   Tobacco comments:    smoking cessation materials not required  Vaping Use   Vaping Use: Never used  Substance Use Topics   Alcohol use: Not Currently     Alcohol/week: 1.0 standard drink of alcohol    Types: 1 Glasses of wine per week    Comment: stopped drinking wine 2 years   Drug use: No     Medication list has been reviewed and updated.  Current Meds  Medication Sig   Ascorbic Acid (VITAMIN C WITH ROSE HIPS) 500 MG tablet Take 500 mg by mouth daily.   buPROPion (WELLBUTRIN XL) 300 MG 24 hr tablet TAKE 1 TABLET BY MOUTH EVERY DAY   Calcium Carbonate-Vit D-Min (CALCIUM 1200 PO) Take 1,200 mg by mouth.   Calcium-Magnesium-Zinc 333-133-5 MG TABS Take by mouth.   CHOLECALCIFEROL PO Take 1 tablet by mouth daily. 5000 iu   Elderberry 500 MG CAPS Take by mouth.   EPINEPHrine 0.3 mg/0.3 mL IJ SOAJ injection INJECT 0.3 MLS (0.3 MG TOTAL) INTO THE MUSCLE AS NEEDED (ANAPHYLAXIS). FOR UP TO 1 DOSE   hydrochlorothiazide (HYDRODIURIL) 25 MG tablet Take 1 tablet (25 mg total) by mouth daily.   Melatonin 10 MG TABS Take by mouth at bedtime as needed.   metoprolol tartrate 37.5 MG TABS Take 37.5 mg by mouth daily.   Misc Natural Products (TURMERIC CURCUMIN) CAPS Take 1 capsule by mouth daily. 1000 mg   Multiple Vitamin (MULTIVITAMIN WITH MINERALS) TABS tablet Take 1 tablet by mouth daily.   Omega-3 Fatty Acids (FISH OIL) 1000 MG CAPS Take by mouth.   psyllium (METAMUCIL) 58.6 % powder Take 1 packet by mouth as needed.   simvastatin (ZOCOR) 20 MG tablet Take 1 tablet (20 mg total) by mouth daily.   Turmeric 1053 MG TABS Take by mouth.   zinc gluconate 50 MG tablet Take 50 mg by mouth daily. 2-3 times per week   sulfamethoxazole-trimethoprim (BACTRIM DS) 800-160 MG tablet Take 1 tablet by mouth 2 (two) times daily for 7 days.       12/07/2021   10:20 AM 09/15/2021   11:13 AM 07/10/2021   10:56 AM 03/13/2021   10:33 AM  GAD 7 : Generalized Anxiety Score  Nervous, Anxious, on Edge 0 '1 1 1  ' Control/stop worrying 0 0 0 1  Worry too much - different things 1 0 1 0  Trouble relaxing 0 0 0 0  Restless 0 0 0 0  Easily annoyed or irritable 0 0 0 0   Afraid - awful might happen 0 0 0 0  Total GAD 7 Score '1 1 2 2  ' Anxiety Difficulty Not difficult at all Not difficult at all Not difficult at all Not difficult at all       12/07/2021   10:20 AM 11/22/2021    9:22 AM 09/15/2021   11:13 AM  Depression screen PHQ 2/9  Decreased Interest 0 0 0  Down, Depressed, Hopeless 0 0 0  PHQ - 2 Score 0 0 0  Altered sleeping 0 0 0  Tired, decreased energy 0 0 0  Change in  appetite 0 0 0  Feeling bad or failure about yourself  0 0 0  Trouble concentrating 0 0 0  Moving slowly or fidgety/restless 0 0 0  Suicidal thoughts 0 0 0  PHQ-9 Score 0 0 0  Difficult doing work/chores Not difficult at all Not difficult at all Not difficult at all    BP Readings from Last 3 Encounters:  12/07/21 136/72  10/12/21 (!) 118/58  09/15/21 128/60    Physical Exam Vitals and nursing note reviewed.  Constitutional:      General: She is not in acute distress.    Appearance: She is well-developed.  HENT:     Head: Normocephalic and atraumatic.  Cardiovascular:     Rate and Rhythm: Normal rate and regular rhythm.  Pulmonary:     Effort: Pulmonary effort is normal. No respiratory distress.  Abdominal:     Tenderness: There is abdominal tenderness in the suprapubic area. There is no right CVA tenderness or left CVA tenderness.  Skin:    General: Skin is warm and dry.     Findings: No rash.  Neurological:     Mental Status: She is alert and oriented to person, place, and time.  Psychiatric:        Mood and Affect: Mood normal.        Behavior: Behavior normal.     Wt Readings from Last 3 Encounters:  12/07/21 149 lb (67.6 kg)  11/22/21 144 lb (65.3 kg)  10/12/21 144 lb (65.3 kg)    BP 136/72   Pulse 63   Temp 98 F (36.7 C) (Oral)   Ht '5\' 4"'  (1.626 m)   Wt 149 lb (67.6 kg)   SpO2 94%   BMI 25.58 kg/m   Assessment and Plan: 1. UTI symptoms Continue to push fluids; Take AZO if needed Call if symptoms worsen - we will call when culture  return. - POCT urinalysis dipstick - Urine Culture - sulfamethoxazole-trimethoprim (BACTRIM DS) 800-160 MG tablet; Take 1 tablet by mouth 2 (two) times daily for 7 days.  Dispense: 14 tablet; Refill: 0   Partially dictated using Editor, commissioning. Any errors are unintentional.  Halina Maidens, MD [facility]  12/07/2021

## 2021-12-12 LAB — URINE CULTURE

## 2021-12-21 ENCOUNTER — Other Ambulatory Visit: Payer: Self-pay | Admitting: Internal Medicine

## 2021-12-21 DIAGNOSIS — F411 Generalized anxiety disorder: Secondary | ICD-10-CM

## 2021-12-21 NOTE — Telephone Encounter (Signed)
Requested medications are due for refill today.  no  Requested medications are on the active medications list.  yes  Last refill. 10/25/2021 #100 0 rf  Future visit scheduled.   yes  Notes to clinic.  Pharmacy needs dx code    Requested Prescriptions  Pending Prescriptions Disp Refills   buPROPion (WELLBUTRIN XL) 300 MG 24 hr tablet [Pharmacy Med Name: BUPROPION HCL XL 300 MG TABLET] 90 tablet 2    Sig: TAKE 1 TABLET BY MOUTH EVERY DAY     Psychiatry: Antidepressants - bupropion Passed - 12/21/2021  8:34 AM      Passed - Cr in normal range and within 360 days    Creatinine  Date Value Ref Range Status  09/17/2011 0.87 0.60 - 1.30 mg/dL Final   Creatinine, Ser  Date Value Ref Range Status  01/10/2021 0.96 0.57 - 1.00 mg/dL Final         Passed - AST in normal range and within 360 days    AST  Date Value Ref Range Status  01/10/2021 22 0 - 40 IU/L Final   SGOT(AST)  Date Value Ref Range Status  09/17/2011 21 15 - 37 Unit/L Final         Passed - ALT in normal range and within 360 days    ALT  Date Value Ref Range Status  01/10/2021 17 0 - 32 IU/L Final   SGPT (ALT)  Date Value Ref Range Status  09/17/2011 32 U/L Final    Comment:    12-78 NOTE: NEW REFERENCE RANGE 01/19/2011          Passed - Last BP in normal range    BP Readings from Last 1 Encounters:  12/07/21 136/72         Passed - Valid encounter within last 6 months    Recent Outpatient Visits           2 weeks ago UTI symptoms   [place] [facility] at [facility], Jesse Sans, MD   3 months ago Essential hypertension   [place] [facility] at [facility], Jesse Sans, MD   5 months ago Essential hypertension   [place] [facility] at [facility], Jesse Sans, MD   9 months ago Benign essential tremor   [place] [facility] at [facility], Jesse Sans, MD    11 months ago Essential hypertension   [place] [facility] at [facility], Jesse Sans, MD       Future Appointments             In 3 weeks [facility] Melia Jesse Sans, MD [facility] at [facility], [facility]

## 2021-12-29 DIAGNOSIS — J301 Allergic rhinitis due to pollen: Secondary | ICD-10-CM | POA: Diagnosis not present

## 2021-12-29 DIAGNOSIS — L508 Other urticaria: Secondary | ICD-10-CM | POA: Diagnosis not present

## 2021-12-29 DIAGNOSIS — R040 Epistaxis: Secondary | ICD-10-CM | POA: Diagnosis not present

## 2021-12-29 DIAGNOSIS — J302 Other seasonal allergic rhinitis: Secondary | ICD-10-CM | POA: Diagnosis not present

## 2021-12-29 DIAGNOSIS — J305 Allergic rhinitis due to food: Secondary | ICD-10-CM | POA: Diagnosis not present

## 2022-01-03 ENCOUNTER — Other Ambulatory Visit: Payer: Self-pay | Admitting: Internal Medicine

## 2022-01-03 DIAGNOSIS — I10 Essential (primary) hypertension: Secondary | ICD-10-CM

## 2022-01-03 NOTE — Telephone Encounter (Signed)
Requested Prescriptions  Pending Prescriptions Disp Refills   Metoprolol Tartrate 37.5 MG TABS [Pharmacy Med Name: METOPROLOL TARTRATE 37.5 MG TB] 90 tablet 0    Sig: TAKE 37.5 MG BY MOUTH DAILY.     Cardiovascular:  Beta Blockers Passed - 01/03/2022  2:35 AM      Passed - Last BP in normal range    BP Readings from Last 1 Encounters:  12/07/21 136/72         Passed - Last Heart Rate in normal range    Pulse Readings from Last 1 Encounters:  12/07/21 63         Passed - Valid encounter within last 6 months    Recent Outpatient Visits           3 weeks ago UTI symptoms   [place] [facility] at [facility], Jesse Sans, MD   3 months ago Essential hypertension   [place] [facility] at [facility], Jesse Sans, MD   5 months ago Essential hypertension   [place] [facility] at [facility], Jesse Sans, MD   9 months ago Benign essential tremor   [place] [facility] at [facility], Jesse Sans, MD   11 months ago Essential hypertension   [place] [facility] at [facility], Jesse Sans, MD       Future Appointments             In 2 weeks [facility] Melia, Jesse Sans, MD [place] [facility] at [facility], [facility]

## 2022-01-04 DIAGNOSIS — R053 Chronic cough: Secondary | ICD-10-CM | POA: Diagnosis not present

## 2022-01-04 DIAGNOSIS — I10 Essential (primary) hypertension: Secondary | ICD-10-CM | POA: Diagnosis not present

## 2022-01-04 DIAGNOSIS — C7A090 Malignant carcinoid tumor of the bronchus and lung: Secondary | ICD-10-CM | POA: Diagnosis not present

## 2022-01-04 DIAGNOSIS — C787 Secondary malignant neoplasm of liver and intrahepatic bile duct: Secondary | ICD-10-CM | POA: Diagnosis not present

## 2022-01-04 DIAGNOSIS — R69 Illness, unspecified: Secondary | ICD-10-CM | POA: Diagnosis not present

## 2022-01-04 DIAGNOSIS — Z902 Acquired absence of lung [part of]: Secondary | ICD-10-CM | POA: Diagnosis not present

## 2022-01-04 DIAGNOSIS — R918 Other nonspecific abnormal finding of lung field: Secondary | ICD-10-CM | POA: Diagnosis not present

## 2022-01-04 DIAGNOSIS — Z23 Encounter for immunization: Secondary | ICD-10-CM | POA: Diagnosis not present

## 2022-01-04 DIAGNOSIS — E785 Hyperlipidemia, unspecified: Secondary | ICD-10-CM | POA: Diagnosis not present

## 2022-01-17 ENCOUNTER — Encounter: Payer: Self-pay | Admitting: Internal Medicine

## 2022-01-17 ENCOUNTER — Ambulatory Visit (INDEPENDENT_AMBULATORY_CARE_PROVIDER_SITE_OTHER): Payer: Medicare HMO | Admitting: Internal Medicine

## 2022-01-17 ENCOUNTER — Telehealth: Payer: Self-pay | Admitting: Internal Medicine

## 2022-01-17 VITALS — BP 118/70 | HR 66 | Ht 64.0 in | Wt 148.0 lb

## 2022-01-17 DIAGNOSIS — E782 Mixed hyperlipidemia: Secondary | ICD-10-CM

## 2022-01-17 DIAGNOSIS — Z0000 Encounter for general adult medical examination without abnormal findings: Secondary | ICD-10-CM

## 2022-01-17 DIAGNOSIS — I10 Essential (primary) hypertension: Secondary | ICD-10-CM

## 2022-01-17 DIAGNOSIS — F411 Generalized anxiety disorder: Secondary | ICD-10-CM | POA: Diagnosis not present

## 2022-01-17 DIAGNOSIS — N649 Disorder of breast, unspecified: Secondary | ICD-10-CM | POA: Diagnosis not present

## 2022-01-17 DIAGNOSIS — L0100 Impetigo, unspecified: Secondary | ICD-10-CM

## 2022-01-17 DIAGNOSIS — R739 Hyperglycemia, unspecified: Secondary | ICD-10-CM | POA: Diagnosis not present

## 2022-01-17 DIAGNOSIS — R69 Illness, unspecified: Secondary | ICD-10-CM | POA: Diagnosis not present

## 2022-01-17 LAB — POCT URINALYSIS DIPSTICK
Bilirubin, UA: NEGATIVE
Blood, UA: NEGATIVE
Glucose, UA: NEGATIVE
Ketones, UA: NEGATIVE
Leukocytes, UA: NEGATIVE
Nitrite, UA: NEGATIVE
Protein, UA: NEGATIVE
Spec Grav, UA: 1.01 (ref 1.010–1.025)
Urobilinogen, UA: 0.2 E.U./dL
pH, UA: 7 (ref 5.0–8.0)

## 2022-01-17 MED ORDER — AMOXICILLIN 875 MG PO TABS: 875.0000 mg | ORAL_TABLET | Freq: Two times a day (BID) | ORAL | 0 refills | Status: AC

## 2022-01-17 NOTE — Progress Notes (Signed)
Date:  01/17/2022   Name:  Alexandra Watson   DOB:  June 22, 1945   MRN:  [id number]   Chief Complaint: Annual Exam (Breast exam no pap ) Alexandra Watson is a 76 y.o. female who presents today for her Complete Annual Exam. She feels well. She reports exercising walking 2 days a week . She reports she is sleeping well. Breast complaints left breast sore.  Mammogram: 01/2021 DEXA: 05/2017 Pap smear: discontinued Colonoscopy: 09/2021 TA  There are no preventive care reminders to display for this patient.   Immunization History  Administered Date(s) Administered   Fluad Quad(high Dose 65+) 12/11/2018, 01/04/2022   Influenza, High Dose Seasonal PF 11/25/2014, 11/24/2015, 11/22/2016, 12/17/2017, 12/23/2020   Influenza-Unspecified 11/30/2019   PFIZER(Purple Top)SARS-COV-2 Vaccination 04/18/2019, 05/10/2019, 01/02/2020, 03/04/2021   Pneumococcal Conjugate-13 02/02/2013   Pneumococcal Polysaccharide-23 11/16/2010, 02/03/2014   Tdap 06/10/2009   Zoster Recombinat (Shingrix) 01/25/2018, 10/25/2018   Zoster, Live 01/25/2018    Hypertension This is a chronic problem. The problem is controlled. Associated symptoms include anxiety. Pertinent negatives include no chest pain, headaches, palpitations or shortness of breath. Past treatments include diuretics and beta blockers. The current treatment provides significant improvement. There is no history of kidney disease, CAD/MI or CVA.  Hyperlipidemia This is a chronic problem. Pertinent negatives include no chest pain or shortness of breath. Current antihyperlipidemic treatment includes statins. The current treatment provides significant improvement of lipids.  Anxiety Presents for follow-up visit. Patient reports no chest pain, dizziness, nervous/anxious behavior, palpitations or shortness of breath. Symptoms occur most days.   Compliance with medications is 76-100% (on bupropion).  Rash This is a recurrent (treated by Derm for impetigo but now  recurring) problem. The rash is diffuse (but also around left nipple - Derm said it was impetigo but it is still weeping and crusty and she is not sure it is not nipple discharge). Pertinent negatives include no congestion, cough, diarrhea, fatigue, fever, shortness of breath or vomiting.  Lung cancer - followed by [facility].  Per note 12/2021: CT scan done 05/26/2020 showed that actually the liver tumors were decreasing in size now. CT done 12/29/2020 showed stability of thoracic lesions, and a further decrease in the size of the liver lesion. CT done 06/29/2021, demonstrated further slight decrease in the size of her liver metastases. CT scan done to today shows minimal increase in the size of the liver metastasis, and the lung lesions remain smaller than they were 1 year ago. So she has had a clear benefit and she completed the treatment in March 2022. We have now resumed watchful waiting with CT scans (without contrast) every 6 months.   Recent CT chest and abdomen: CT chest: 1.  Unchanged multifocal nodular left pleural based masses, consistent with  metastasis.  2. Stable posttreatment findings of the left lung status post left upper  lobectomy.  3.  Patulous, fluid filled esophagus which may predispose to aspiration.  4.  Please refer to dedicated CT of the abdomen and pelvis for evaluation  of structures below the diaphragm.  CT abdomen:Impression:  Unchanged size of hypoattenuating segment 8 hepatic metastasis. No new or  enlarging metastasis in the abdomen within the limits of noncontrast exam.    Lab Results  Component Value Date   NA 137 01/10/2021   K 4.3 01/10/2021   CO2 27 01/10/2021   GLUCOSE 85 01/10/2021   BUN 16 01/10/2021   CREATININE 0.96 01/10/2021   CALCIUM 9.8 01/10/2021   EGFR 61 01/10/2021  GFRNONAA 80 01/12/2020   Lab Results  Component Value Date   CHOL 160 01/10/2021   HDL 49 01/10/2021   LDLCALC 85 01/10/2021   TRIG 149 01/10/2021   CHOLHDL 3.3 01/10/2021    Lab Results  Component Value Date   TSH 1.720 01/10/2021   No results found for: "HGBA1C" Lab Results  Component Value Date   WBC 3.6 07/10/2021   HGB 10.7 (L) 07/10/2021   HCT 32.1 (L) 07/10/2021   MCV 89 07/10/2021   PLT 237 07/10/2021   Lab Results  Component Value Date   ALT 17 01/10/2021   AST 22 01/10/2021   ALKPHOS 93 01/10/2021   BILITOT 0.2 01/10/2021   No results found for: "25OHVITD2", "25OHVITD3", "VD25OH"   Review of Systems  Constitutional:  Negative for chills, fatigue and fever.  HENT:  Negative for congestion, hearing loss, tinnitus, trouble swallowing and voice change.   Eyes:  Negative for visual disturbance.  Respiratory:  Negative for cough, chest tightness, shortness of breath and wheezing.   Cardiovascular:  Negative for chest pain, palpitations and leg swelling.  Gastrointestinal:  Negative for abdominal pain, constipation, diarrhea and vomiting.  Endocrine: Negative for polydipsia and polyuria.  Genitourinary:  Negative for dysuria, frequency, genital sores, vaginal bleeding and vaginal discharge.  Musculoskeletal:  Negative for arthralgias, gait problem and joint swelling.  Skin:  Positive for rash. Negative for color change.  Neurological:  Negative for dizziness, tremors, light-headedness and headaches.  Hematological:  Negative for adenopathy. Does not bruise/bleed easily.  Psychiatric/Behavioral:  Negative for dysphoric mood and sleep disturbance. The patient is not nervous/anxious.     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   Primary osteoarthritis of left knee 09/17/2018   Generalized anxiety disorder 11/02/2017   Weight loss, abnormal 10/11/2017   Neuropathy 07/06/2016   SUI (stress urinary incontinence, female) 11/11/2015   Incomplete emptying of bladder  11/11/2015   Recurrent UTI 11/10/2015   Arthritis of knee, left 07/01/2015   Insomnia 06/30/2015   Atrophic vaginitis 06/30/2015   Gastroesophageal reflux disease 06/30/2015   Osteopenia 05/20/2015   Anemia 09/14/2012   Constipation 08/27/2012   Essential hypertension 10/04/2011   Hyperlipidemia, mixed 10/04/2011    Allergies  Allergen Reactions   Galactose Anaphylaxis, Anxiety, Diarrhea, Itching and Shortness Of Breath   Primidone Nausea Only   Iodinated Contrast Media Hives   Oxycodone Nausea And Vomiting and Nausea Only    Past Surgical History:  Procedure Laterality Date   ANKLE ARTHROSCOPY WITH OPEN REDUCTION INTERNAL FIXATION (ORIF)  07/2015   tri-malleolar   BUNIONECTOMY Bilateral 2001, 2005   CHOLECYSTECTOMY     COLONOSCOPY  05/2010   normal   COLONOSCOPY WITH PROPOFOL N/A 10/12/2021   Procedure: COLONOSCOPY WITH PROPOFOL;  Surgeon: Annamaria Helling, DO;  Location: [facility] ENDOSCOPY;  Service: Gastroenterology;  Laterality: N/A;   GANGLION CYST EXCISION Left    THORACOTOMY / DECORTICATION PARIETAL PLEURA Left 06/2017   [facility]   TOTAL KNEE ARTHROPLASTY Left 11/08/2020   VATS Sleeve lobectomy Left 09/2011   typical variant carcinoid    Social History   Tobacco Use   Smoking status: Former    Packs/day: 0.25    Years: 20.00    Total pack years: 5.00    Types: Cigarettes    Quit date: 1985  Years since quitting: 38.9   Smokeless tobacco: Never   Tobacco comments:    smoking cessation materials not required  Vaping Use   Vaping Use: Never used  Substance Use Topics   Alcohol use: Not Currently    Alcohol/week: 1.0 standard drink of alcohol    Types: 1 Glasses of wine per week    Comment: stopped drinking wine 2 years   Drug use: No     Medication list has been reviewed and updated.  Current Meds  Medication Sig   amoxicillin (AMOXIL) 875 MG tablet Take 1 tablet (875 mg total) by mouth 2 (two) times daily for 10 days.   Ascorbic Acid (VITAMIN C  WITH ROSE HIPS) 500 MG tablet Take 500 mg by mouth daily.   buPROPion (WELLBUTRIN XL) 300 MG 24 hr tablet TAKE 1 TABLET BY MOUTH EVERY DAY   Calcium Carbonate-Vit D-Min (CALCIUM 1200 PO) Take 1,200 mg by mouth.   Calcium-Magnesium-Zinc 333-133-5 MG TABS Take by mouth.   CHOLECALCIFEROL PO Take 1 tablet by mouth daily. 5000 iu   Elderberry 500 MG CAPS Take by mouth.   EPINEPHrine 0.3 mg/0.3 mL IJ SOAJ injection INJECT 0.3 MLS (0.3 MG TOTAL) INTO THE MUSCLE AS NEEDED (ANAPHYLAXIS). FOR UP TO 1 DOSE   gentamicin ointment (GARAMYCIN) 0.1 % 3 (three) times daily.   hydrochlorothiazide (HYDRODIURIL) 25 MG tablet Take 1 tablet (25 mg total) by mouth daily.   Melatonin 10 MG TABS Take by mouth at bedtime as needed.   Metoprolol Tartrate 37.5 MG TABS TAKE 37.5 MG BY MOUTH DAILY.   Misc Natural Products (TURMERIC CURCUMIN) CAPS Take 1 capsule by mouth daily. 1000 mg   Multiple Vitamin (MULTIVITAMIN WITH MINERALS) TABS tablet Take 1 tablet by mouth daily.   Omega-3 Fatty Acids (FISH OIL) 1000 MG CAPS Take by mouth.   psyllium (METAMUCIL) 58.6 % powder Take 1 packet by mouth as needed.   simvastatin (ZOCOR) 20 MG tablet Take 1 tablet (20 mg total) by mouth daily.   Turmeric 1053 MG TABS Take by mouth.   [DISCONTINUED] predniSONE (STERAPRED UNI-PAK 48 TAB) 5 MG (48) TBPK tablet Take by mouth.   [DISCONTINUED] zinc gluconate 50 MG tablet Take 50 mg by mouth daily. 2-3 times per week       01/17/2022    9:54 AM 12/07/2021   10:20 AM 09/15/2021   11:13 AM 07/10/2021   10:56 AM  GAD 7 : Generalized Anxiety Score  Nervous, Anxious, on Edge 0 0 1 1  Control/stop worrying 0 0 0 0  Worry too much - different things 0 1 0 1  Trouble relaxing 0 0 0 0  Restless 0 0 0 0  Easily annoyed or irritable 0 0 0 0  Afraid - awful might happen 0 0 0 0  Total GAD 7 Score 0 _0 Anxiety Difficulty Not difficult at all Not difficult at all Not difficult at all Not difficult at all       01/17/2022    9:54 AM  12/07/2021   10:20 AM 11/22/2021    9:22 AM  Depression screen PHQ 2/9  Decreased Interest 0 0 0  Down, Depressed, Hopeless 0 0 0  PHQ - 2 Score 0 0 0  Altered sleeping 0 0 0  Tired, decreased energy 0 0 0  Change in appetite 0 0 0  Feeling bad or failure about yourself  0 0 0  Trouble concentrating 0 0 0  Moving slowly or fidgety/restless  0 0 0  Suicidal thoughts 0 0 0  PHQ-9 Score 0 0 0  Difficult doing work/chores Not difficult at all Not difficult at all Not difficult at all    BP Readings from Last 3 Encounters:  01/17/22 118/70  12/07/21 136/72  10/12/21 (!) 118/58    Physical Exam Vitals and nursing note reviewed.  Constitutional:      General: She is not in acute distress.    Appearance: She is well-developed.  HENT:     Head: Normocephalic and atraumatic.     Right Ear: Tympanic membrane and ear canal normal.     Left Ear: Tympanic membrane and ear canal normal.     Nose:     Right Sinus: No maxillary sinus tenderness.     Left Sinus: No maxillary sinus tenderness.  Eyes:     General: No scleral icterus.       Right eye: No discharge.        Left eye: No discharge.     Conjunctiva/sclera: Conjunctivae normal.  Neck:     Thyroid: No thyromegaly.     Vascular: No carotid bruit.  Cardiovascular:     Rate and Rhythm: Normal rate and regular rhythm.     Pulses: Normal pulses.     Heart sounds: Normal heart sounds.  Pulmonary:     Effort: Pulmonary effort is normal. No respiratory distress.     Breath sounds: No wheezing.  Chest:  Breasts:    Right: No mass, nipple discharge, skin change or tenderness.     Left: Nipple discharge and skin change present. No mass or tenderness.     Comments: Crusting around left nipple, slightly red Could not elicit any discharge from the nipple Abdominal:     General: Bowel sounds are normal.     Palpations: Abdomen is soft.     Tenderness: There is no abdominal tenderness.  Musculoskeletal:     Cervical back: Normal  range of motion. No erythema.     Right lower leg: No edema.     Left lower leg: No edema.  Lymphadenopathy:     Cervical: No cervical adenopathy.  Skin:    General: Skin is warm and dry.     Findings: No rash.  Neurological:     Mental Status: She is alert and oriented to person, place, and time.     Cranial Nerves: No cranial nerve deficit.     Sensory: No sensory deficit.     Deep Tendon Reflexes: Reflexes are normal and symmetric.  Psychiatric:        Attention and Perception: Attention normal.        Mood and Affect: Mood normal.     Wt Readings from Last 3 Encounters:  01/17/22 148 lb (67.1 kg)  12/07/21 149 lb (67.6 kg)  11/22/21 144 lb (65.3 kg)    BP 118/70   Pulse 66   Ht _0  (1.626 m)   Wt 148 lb (67.1 kg)   SpO2 99%   BMI 25.40 kg/m   Assessment and Plan: 1. Annual physical exam Normal exam Recommend RSV vaccine  2. Essential hypertension Clinically stable exam with well controlled BP. Tolerating medications without side effects at this time. Pt to continue current regimen and low sodium diet; benefits of regular exercise as able discussed. - CBC with Differential/Platelet - Comprehensive metabolic panel - POCT urinalysis dipstick  3. Hyperlipidemia, mixed On statin therapy without side effects - Lipid panel  4. Generalized anxiety disorder Clinically  stable on current regimen with good control of symptoms, No SI or HI. Will continue current therapy with bupropion. - TSH  5. Elevated serum glucose Check for prediabetes - Hemoglobin A1c  6. Impetigo Recommend another course of Amox Follow up with Dermatology - amoxicillin (AMOXIL) 875 MG tablet; Take 1 tablet (875 mg total) by mouth 2 (two) times daily for 10 days.  Dispense: 20 tablet; Refill: 0  7. Disorder of nipple of breast Cannot be certain the nipple changes are not due to discharge rather than impetigo Will order Dx mammo - MM DIAG BREAST TOMO BILATERAL - US BREAST LTD UNI LEFT  INC AXILLA - US BREAST LTD UNI RIGHT INC AXILLA   Partially dictated using Editor, commissioning. Any errors are unintentional.  Halina Maidens, MD [facility]  01/17/2022

## 2022-01-17 NOTE — Patient Instructions (Signed)
Call [facility] to schedule your mammogram at [phone].  

## 2022-01-17 NOTE — Telephone Encounter (Signed)
Copied from [place] [id number]. Topic: General - Other >> Jan 17, 2022 12:04 PM Chapman Fitch wrote: Reason for CRM: Pt missed a call from the office / please advise

## 2022-01-17 NOTE — Telephone Encounter (Signed)
Called pt let her know that I viewed her chart their was no phone calls in her chart. Pt verbalized understanding.  KP

## 2022-01-18 LAB — COMPREHENSIVE METABOLIC PANEL
ALT: 15 IU/L (ref 0–32)
AST: 18 IU/L (ref 0–40)
Albumin/Globulin Ratio: 2.5 — ABNORMAL HIGH (ref 1.2–2.2)
Albumin: 4.8 g/dL (ref 3.8–4.8)
Alkaline Phosphatase: 73 IU/L (ref 44–121)
BUN/Creatinine Ratio: 15 (ref 12–28)
BUN: 16 mg/dL (ref 8–27)
Bilirubin Total: 0.4 mg/dL (ref 0.0–1.2)
CO2: 25 mmol/L (ref 20–29)
Calcium: 9.2 mg/dL (ref 8.7–10.3)
Chloride: 94 mmol/L — ABNORMAL LOW (ref 96–106)
Creatinine, Ser: 1.09 mg/dL — ABNORMAL HIGH (ref 0.57–1.00)
Globulin, Total: 1.9 g/dL (ref 1.5–4.5)
Glucose: 82 mg/dL (ref 70–99)
Potassium: 4.2 mmol/L (ref 3.5–5.2)
Sodium: 137 mmol/L (ref 134–144)
Total Protein: 6.7 g/dL (ref 6.0–8.5)
eGFR: 52 mL/min/{1.73_m2} — ABNORMAL LOW (ref >59)

## 2022-01-18 LAB — CBC WITH DIFFERENTIAL/PLATELET
Basophils Absolute: 0.1 10*3/uL (ref 0.0–0.2)
Basos: 1 %
EOS (ABSOLUTE): 0.1 10*3/uL (ref 0.0–0.4)
Eos: 2 %
Hematocrit: 35.5 % (ref 34.0–46.6)
Hemoglobin: 11.8 g/dL (ref 11.1–15.9)
Immature Grans (Abs): 0 10*3/uL (ref 0.0–0.1)
Immature Granulocytes: 0 %
Lymphocytes Absolute: 0.9 10*3/uL (ref 0.7–3.1)
Lymphs: 18 %
MCH: 30.1 pg (ref 26.6–33.0)
MCHC: 33.2 g/dL (ref 31.5–35.7)
MCV: 91 fL (ref 79–97)
Monocytes Absolute: 0.6 10*3/uL (ref 0.1–0.9)
Monocytes: 12 %
Neutrophils Absolute: 3.4 10*3/uL (ref 1.4–7.0)
Neutrophils: 67 %
Platelets: 261 10*3/uL (ref 150–450)
RBC: 3.92 x10E6/uL (ref 3.77–5.28)
RDW: 13.4 % (ref 11.7–15.4)
WBC: 5.1 10*3/uL (ref 3.4–10.8)

## 2022-01-18 LAB — HEMOGLOBIN A1C
Est. average glucose Bld gHb Est-mCnc: 131 mg/dL
Hgb A1c MFr Bld: 6.2 % — ABNORMAL HIGH (ref 4.8–5.6)

## 2022-01-18 LAB — LIPID PANEL
Chol/HDL Ratio: 2.8 ratio (ref 0.0–4.4)
Cholesterol, Total: 183 mg/dL (ref 100–199)
HDL: 66 mg/dL (ref >39)
LDL Chol Calc (NIH): 101 mg/dL — ABNORMAL HIGH (ref 0–99)
Triglycerides: 86 mg/dL (ref 0–149)
VLDL Cholesterol Cal: 16 mg/dL (ref 5–40)

## 2022-01-18 LAB — TSH: TSH: 2.25 u[IU]/mL (ref 0.450–4.500)

## 2022-01-20 ENCOUNTER — Other Ambulatory Visit: Payer: Self-pay | Admitting: Internal Medicine

## 2022-01-20 DIAGNOSIS — F411 Generalized anxiety disorder: Secondary | ICD-10-CM

## 2022-01-23 NOTE — Telephone Encounter (Signed)
Requested medication (s) are due for refill today: routing for review  Requested medication (s) are on the active medication list: yes  Last refill:  10/25/21  Future visit scheduled: yes  Notes to clinic: DX Code Needed.       Requested Prescriptions  Pending Prescriptions Disp Refills   buPROPion (WELLBUTRIN XL) 300 MG 24 hr tablet [Pharmacy Med Name: BUPROPION HCL XL 300 MG TABLET] 90 tablet 2    Sig: TAKE 1 TABLET BY MOUTH EVERY DAY     Psychiatry: Antidepressants - bupropion Failed - 01/20/2022  2:33 PM      Failed - Cr in normal range and within 360 days    Creatinine  Date Value Ref Range Status  09/17/2011 0.87 0.60 - 1.30 mg/dL Final   Creatinine, Ser  Date Value Ref Range Status  01/17/2022 1.09 (H) 0.57 - 1.00 mg/dL Final         Passed - AST in normal range and within 360 days    AST  Date Value Ref Range Status  01/17/2022 18 0 - 40 IU/L Final   SGOT(AST)  Date Value Ref Range Status  09/17/2011 21 15 - 37 Unit/L Final         Passed - ALT in normal range and within 360 days    ALT  Date Value Ref Range Status  01/17/2022 15 0 - 32 IU/L Final   SGPT (ALT)  Date Value Ref Range Status  09/17/2011 32 U/L Final    Comment:    12-78 NOTE: NEW REFERENCE RANGE 01/19/2011          Passed - Last BP in normal range    BP Readings from Last 1 Encounters:  01/17/22 118/70         Passed - Valid encounter within last 6 months    Recent Outpatient Visits           6 days ago Annual physical exam   [place] [facility] at [facility], Jesse Sans, MD   1 month ago UTI symptoms   [place] [facility] at [facility], Jesse Sans, MD   4 months ago Essential hypertension   [place] [facility] at [facility], Jesse Sans, MD   6 months ago Essential hypertension   [place] [facility] at [facility], Jesse Sans, MD    10 months ago Benign essential tremor   [facility] at [facility], Jesse Sans, MD       Future Appointments             In 12 months Army Melia, Jesse Sans, MD [facility] at [facility]

## 2022-01-24 ENCOUNTER — Other Ambulatory Visit: Payer: Self-pay | Admitting: Internal Medicine

## 2022-01-24 DIAGNOSIS — F411 Generalized anxiety disorder: Secondary | ICD-10-CM

## 2022-01-24 NOTE — Telephone Encounter (Signed)
Requested Prescriptions  Pending Prescriptions Disp Refills   buPROPion (WELLBUTRIN XL) 300 MG 24 hr tablet [Pharmacy Med Name: BUPROPION HCL XL 300 MG TABLET] 100 tablet 0    Sig: TAKE 1 TABLET BY MOUTH EVERY DAY     Psychiatry: Antidepressants - bupropion Failed - 01/24/2022  2:43 AM      Failed - Cr in normal range and within 360 days    Creatinine  Date Value Ref Range Status  09/17/2011 0.87 0.60 - 1.30 mg/dL Final   Creatinine, Ser  Date Value Ref Range Status  01/17/2022 1.09 (H) 0.57 - 1.00 mg/dL Final         Passed - AST in normal range and within 360 days    AST  Date Value Ref Range Status  01/17/2022 18 0 - 40 IU/L Final   SGOT(AST)  Date Value Ref Range Status  09/17/2011 21 15 - 37 Unit/L Final         Passed - ALT in normal range and within 360 days    ALT  Date Value Ref Range Status  01/17/2022 15 0 - 32 IU/L Final   SGPT (ALT)  Date Value Ref Range Status  09/17/2011 32 U/L Final    Comment:    12-78 NOTE: NEW REFERENCE RANGE 01/19/2011          Passed - Last BP in normal range    BP Readings from Last 1 Encounters:  01/17/22 118/70         Passed - Valid encounter within last 6 months    Recent Outpatient Visits           1 week ago Annual physical exam   [place] [facility] at [facility], Jesse Sans, MD   1 month ago UTI symptoms   [place] [facility] at [facility], Jesse Sans, MD   4 months ago Essential hypertension   [place] [facility] at [facility], Jesse Sans, MD   6 months ago Essential hypertension   [place] [facility] at [facility], Jesse Sans, MD   10 months ago Benign essential tremor   [facility] at [facility], Jesse Sans, MD       Future Appointments             In 12 months Army Melia, Jesse Sans, MD [place] [facility] at [facility], [facility]

## 2022-02-02 DIAGNOSIS — L508 Other urticaria: Secondary | ICD-10-CM | POA: Diagnosis not present

## 2022-02-05 ENCOUNTER — Other Ambulatory Visit: Payer: Self-pay | Admitting: Internal Medicine

## 2022-02-05 ENCOUNTER — Ambulatory Visit
Admission: RE | Admit: 2022-02-05 | Discharge: 2022-02-05 | Disposition: A | Discharge status: Home / Self Care | Payer: Medicare HMO | Source: Ambulatory Visit | Attending: Internal Medicine | Admitting: Internal Medicine

## 2022-02-05 DIAGNOSIS — N649 Disorder of breast, unspecified: Secondary | ICD-10-CM | POA: Diagnosis not present

## 2022-02-05 DIAGNOSIS — R922 Inconclusive mammogram: Secondary | ICD-10-CM | POA: Diagnosis not present

## 2022-02-05 DIAGNOSIS — I10 Essential (primary) hypertension: Secondary | ICD-10-CM

## 2022-02-05 DIAGNOSIS — N6489 Other specified disorders of breast: Secondary | ICD-10-CM | POA: Diagnosis not present

## 2022-02-17 ENCOUNTER — Ambulatory Visit
Admission: EM | Admit: 2022-02-17 | Discharge: 2022-02-17 | Disposition: A | Discharge status: Home / Self Care | Payer: Medicare HMO | Attending: Physician Assistant | Admitting: Physician Assistant

## 2022-02-17 ENCOUNTER — Encounter: Payer: Self-pay | Admitting: Emergency Medicine

## 2022-02-17 DIAGNOSIS — R5383 Other fatigue: Secondary | ICD-10-CM

## 2022-02-17 DIAGNOSIS — I10 Essential (primary) hypertension: Secondary | ICD-10-CM | POA: Diagnosis not present

## 2022-02-17 DIAGNOSIS — E785 Hyperlipidemia, unspecified: Secondary | ICD-10-CM | POA: Insufficient documentation

## 2022-02-17 DIAGNOSIS — J069 Acute upper respiratory infection, unspecified: Secondary | ICD-10-CM | POA: Insufficient documentation

## 2022-02-17 DIAGNOSIS — R059 Cough, unspecified: Secondary | ICD-10-CM | POA: Insufficient documentation

## 2022-02-17 DIAGNOSIS — Z1152 Encounter for screening for COVID-19: Secondary | ICD-10-CM | POA: Insufficient documentation

## 2022-02-17 LAB — SARS CORONAVIRUS 2 BY RT PCR: SARS Coronavirus 2 by RT PCR: NEGATIVE

## 2022-02-17 LAB — RAPID INFLUENZA A&B ANTIGENS
Influenza A (ARMC): NEGATIVE
Influenza B (ARMC): NEGATIVE

## 2022-02-17 NOTE — ED Provider Notes (Signed)
[facility]    CSN: [id number] Arrival date & time: 02/17/22  0905      History   Chief Complaint Chief Complaint  Patient presents with   Cough    HPI Alexandra Watson is a 76 y.o. female presenting for chills, feeling hot, headaches, cough, congestion and bodyaches that began yesterday.  Denies any fevers.  No breathing difficulty or wheezing, vomiting or diarrhea.  Denies any sick contacts.  Patient has taken Mucinex which she says helps with her symptoms.  Her medical history is significant for liver cancer and lung cancer as well as hypertension and hyperlipidemia.  No other complaints.  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   Primary osteoarthritis of left knee 09/17/2018   Generalized anxiety disorder 11/02/2017   Weight loss, abnormal 10/11/2017   Neuropathy 07/06/2016   SUI (stress urinary incontinence, female) 11/11/2015   Incomplete emptying of bladder 11/11/2015   Recurrent UTI 11/10/2015   Arthritis of knee, left 07/01/2015   Insomnia 06/30/2015   Atrophic vaginitis 06/30/2015   Gastroesophageal reflux disease 06/30/2015   Osteopenia 05/20/2015   Anemia 09/14/2012   Constipation 08/27/2012   Essential hypertension 10/04/2011   Hyperlipidemia, mixed 10/04/2011    Past Surgical History:  Procedure Laterality Date   ANKLE ARTHROSCOPY WITH OPEN REDUCTION INTERNAL FIXATION (ORIF)  07/2015   tri-malleolar   BUNIONECTOMY Bilateral 2001, 2005   CHOLECYSTECTOMY      COLONOSCOPY  05/2010   normal   COLONOSCOPY WITH PROPOFOL N/A 10/12/2021   Procedure: COLONOSCOPY WITH PROPOFOL;  Surgeon: Annamaria Helling, DO;  Location: [facility] ENDOSCOPY;  Service: Gastroenterology;  Laterality: N/A;   GANGLION CYST EXCISION Left    THORACOTOMY / DECORTICATION PARIETAL PLEURA Left 06/2017   [facility]   TOTAL KNEE ARTHROPLASTY Left 11/08/2020   VATS Sleeve lobectomy Left 09/2011   typical variant carcinoid    OB History   No obstetric history on file.      Home Medications    Prior to Admission medications   Medication Sig Start Date End Date Taking? Authorizing Provider  Ascorbic Acid (VITAMIN C WITH ROSE HIPS) 500 MG tablet Take 500 mg by mouth daily.    [provider]  buPROPion (WELLBUTRIN XL) 300 MG 24 hr tablet TAKE 1 TABLET BY MOUTH EVERY DAY 01/24/22   Glean Hess, MD  Calcium Carbonate-Vit D-Min (CALCIUM 1200 PO) Take 1,200 mg by mouth.    [provider]  Calcium-Magnesium-Zinc [phone] MG TABS Take by mouth.    [provider]  CHOLECALCIFEROL PO Take 1 tablet by mouth daily. 5000 iu    [provider]  Elderberry 500 MG CAPS Take by mouth.    [provider]  EPINEPHrine 0.3 mg/0.3 mL IJ SOAJ injection INJECT 0.3 MLS (0.3 MG TOTAL) INTO THE MUSCLE AS NEEDED (ANAPHYLAXIS). FOR UP TO 1 DOSE 08/25/18   [provider]  gentamicin ointment (GARAMYCIN) 0.1 % 3 (three) times  daily. 12/29/21   [provider]  hydrochlorothiazide (HYDRODIURIL) 25 MG tablet TAKE 1 TABLET (25 MG TOTAL) BY MOUTH DAILY. 02/05/22   Glean Hess, MD  Melatonin 10 MG TABS Take by mouth at bedtime as needed.    [provider]  Metoprolol Tartrate 37.5 MG TABS TAKE 37.5 MG BY MOUTH DAILY. 01/03/22   Glean Hess, MD  Misc Natural Products (TURMERIC CURCUMIN) CAPS Take 1 capsule by mouth daily. 1000 mg    [provider]  Multiple Vitamin (MULTIVITAMIN WITH MINERALS) TABS tablet Take 1  tablet by mouth daily.    [provider]  Omega-3 Fatty Acids (FISH OIL) 1000 MG CAPS Take by mouth.    [provider]  psyllium (METAMUCIL) 58.6 % powder Take 1 packet by mouth as needed.    [provider]  simvastatin (ZOCOR) 20 MG tablet Take 1 tablet (20 mg total) by mouth daily. 09/15/21   Glean Hess, MD  Turmeric 1053 MG TABS Take by mouth.    [provider]    Family History Family History  Problem Relation Age of Onset   Stroke Mother    Heart disease Mother    Heart attack Father    Heart disease Father    CAD Brother    Heart disease Brother    Heart attack Brother    Heart disease Brother    Breast cancer Neg Hx     Social History Social History   Tobacco Use   Smoking status: Former    Packs/day: 0.25    Years: 20.00    Total pack years: 5.00    Types: Cigarettes    Quit date: 1985    Years since quitting: 39.0   Smokeless tobacco: Never   Tobacco comments:    smoking cessation materials not required  Vaping Use   Vaping Use: Never used  Substance Use Topics   Alcohol use: Not Currently    Alcohol/week: 1.0 standard drink of alcohol    Types: 1 Glasses of wine per week    Comment: stopped drinking wine 2 years   Drug use: No     Allergies   Galactose, Primidone, Iodinated contrast media, and Oxycodone   Review of Systems Review of Systems  Constitutional:  Positive for chills and fatigue. Negative for diaphoresis and fever.  HENT:  Positive for congestion, rhinorrhea and sore throat. Negative for ear pain, sinus pressure and sinus pain.   Respiratory:  Positive for cough. Negative for shortness of breath.   Cardiovascular:  Negative for chest pain.  Gastrointestinal:  Negative for abdominal pain, nausea and vomiting.  Musculoskeletal:  Positive for myalgias. Negative for arthralgias.  Skin:  Negative for rash.  Neurological:  Positive for headaches. Negative for weakness.  Hematological:  Negative  for adenopathy.     Physical Exam Triage Vital Signs ED Triage Vitals  Enc Vitals Group     BP      Pulse      Resp      Temp      Temp src      SpO2      Weight      Height      Head Circumference      Peak Flow      Pain Score      Pain Loc      Pain Edu?      Excl. in [place]?    No data found.  Updated Vital Signs BP  129/70 (BP Location: Right Arm)   Pulse 74   Temp 99.4 F (37.4 C) (Oral)   Resp 14   Ht 5\' 4"  (1.626 m)   Wt 147 lb 14.9 oz (67.1 kg)   SpO2 95%   BMI 25.39 kg/m   Physical Exam Vitals and nursing note reviewed.  Constitutional:      General: She is not in acute distress.    Appearance: Normal appearance. She is not ill-appearing or toxic-appearing.  HENT:     Head: Normocephalic and atraumatic.     Nose: Congestion present.     Mouth/Throat:     Mouth: Mucous membranes are moist.     Pharynx: Oropharynx is clear. Posterior oropharyngeal erythema present.  Eyes:     General: No scleral icterus.       Right eye: No discharge.        Left eye: No discharge.     Conjunctiva/sclera: Conjunctivae normal.  Cardiovascular:     Rate and Rhythm: Normal rate and regular rhythm.     Heart sounds: Normal heart sounds.  Pulmonary:     Effort: Pulmonary effort is normal. No respiratory distress.     Breath sounds: Normal breath sounds.  Musculoskeletal:     Cervical back: Neck supple.  Skin:    General: Skin is dry.  Neurological:     General: No focal deficit present.     Mental Status: She is alert. Mental status is at baseline.     Motor: No weakness.     Gait: Gait normal.  Psychiatric:        Mood and Affect: Mood normal.        Behavior: Behavior normal.        Thought Content: Thought content normal.      UC Treatments / Results  Labs (all labs ordered are listed, but only abnormal results are displayed) Labs Reviewed  RAPID INFLUENZA A&B ANTIGENS  SARS CORONAVIRUS 2 BY RT PCR    EKG   Radiology No results  found.  Procedures Procedures (including critical care time)  Medications Ordered in UC Medications - No data to display  Initial Impression / Assessment and Plan / UC Course  I have reviewed the triage vital signs and the nursing notes.  Pertinent labs & imaging results that were available during my care of the patient were reviewed by me and considered in my medical decision making (see chart for details).   76 year old female presents for fatigue, cough, congestion and headaches that began yesterday.  Vitals normal and stable and she is overall well-appearing.  On exam she has nasal congestion and mild posterior pharyngeal erythema with clear postnasal drainage.  Chest clear to auscultation heart regular rate rhythm.  Rapid flu and COVID testing obtained today.  All negative.  Discussed with patient.  Advised if she has a viral illness.  Supportive care encouraged.  Advised to continue Mucinex and her symptoms.  Return precautions.   Final Clinical Impressions(s) / UC Diagnoses   Final diagnoses:  Viral URI with cough  Other fatigue     Discharge Instructions      -Negative COVID and flu.  URI/COLD SYMPTOMS: Your exam today is consistent with a viral illness. Antibiotics are not indicated at this time. Use medications as directed, including cough syrup, nasal saline, and decongestants. Your symptoms should improve over the next few days and resolve within 7-10 days. Increase rest and fluids. F/u if symptoms worsen or predominate such as sore throat, ear  pain, productive cough, shortness of breath, or if you develop high fevers or worsening fatigue over the next several days.       ED Prescriptions   None    PDMP not reviewed this encounter.   Danton Clap, PA-C 02/17/22 1127

## 2022-02-17 NOTE — ED Triage Notes (Signed)
Patient c/o headache, runny nose, cough and congestion that started yesterday.  Patient unsure of fevers.

## 2022-02-17 NOTE — Discharge Instructions (Signed)
-  Negative COVID and flu.  URI/COLD SYMPTOMS: Your exam today is consistent with a viral illness. Antibiotics are not indicated at this time. Use medications as directed, including cough syrup, nasal saline, and decongestants. Your symptoms should improve over the next few days and resolve within 7-10 days. Increase rest and fluids. F/u if symptoms worsen or predominate such as sore throat, ear pain, productive cough, shortness of breath, or if you develop high fevers or worsening fatigue over the next several days.

## 2022-02-21 ENCOUNTER — Ambulatory Visit (INDEPENDENT_AMBULATORY_CARE_PROVIDER_SITE_OTHER): Payer: Medicare HMO | Admitting: Internal Medicine

## 2022-02-21 ENCOUNTER — Other Ambulatory Visit: Payer: Self-pay | Admitting: Internal Medicine

## 2022-02-21 ENCOUNTER — Encounter: Payer: Self-pay | Admitting: Internal Medicine

## 2022-02-21 VITALS — BP 126/62 | HR 63 | Temp 98.2°F | Ht 64.0 in | Wt 148.0 lb

## 2022-02-21 DIAGNOSIS — R399 Unspecified symptoms and signs involving the genitourinary system: Secondary | ICD-10-CM

## 2022-02-21 DIAGNOSIS — J069 Acute upper respiratory infection, unspecified: Secondary | ICD-10-CM | POA: Diagnosis not present

## 2022-02-21 DIAGNOSIS — R350 Frequency of micturition: Secondary | ICD-10-CM

## 2022-02-21 LAB — POCT URINALYSIS DIPSTICK
Bilirubin, UA: NEGATIVE
Glucose, UA: NEGATIVE
Ketones, UA: NEGATIVE
Nitrite, UA: POSITIVE
Protein, UA: NEGATIVE
Spec Grav, UA: <=1.005 — AB (ref 1.010–1.025)
Urobilinogen, UA: 0.2 E.U./dL
pH, UA: 7.5 (ref 5.0–8.0)

## 2022-02-21 MED ORDER — CEFUROXIME AXETIL 500 MG PO TABS: 500.0000 mg | ORAL_TABLET | Freq: Two times a day (BID) | ORAL | 0 refills | Status: AC

## 2022-02-21 MED ORDER — PROMETHAZINE-DM 6.25-15 MG/5ML PO SYRP: 5.0000 mL | ORAL_SOLUTION | Freq: Four times a day (QID) | ORAL | 0 refills | Status: AC | PRN

## 2022-02-21 NOTE — Progress Notes (Signed)
Date:  02/21/2022   Name:  Alexandra Watson   DOB:  August 27, 1945   MRN:  [id number]   Chief Complaint: Cough (Neg flu and covid at UC,12/23) and Urinary Tract Infection (Burning, cloudy urine)  Cough This is a recurrent problem. Episode onset: about one week ago. The problem has been unchanged. The problem occurs hourly. The cough is Productive of sputum (yellow). Associated symptoms include headaches, nasal congestion, postnasal drip and rhinorrhea. Pertinent negatives include no chest pain, chills, fever, shortness of breath or wheezing. She has tried OTC cough suppressant for the symptoms. The treatment provided mild relief.  Urinary Tract Infection  This is a new problem. Episode onset: 2-3 days. The problem occurs every urination. The problem has been unchanged. The quality of the pain is described as burning. The pain is at a severity of 4/10. The pain is mild. There has been no fever. Associated symptoms include frequency and urgency. Pertinent negatives include no chills, hematuria, nausea or vomiting. She has tried nothing for the symptoms.    Lab Results  Component Value Date   NA 137 01/17/2022   K 4.2 01/17/2022   CO2 25 01/17/2022   GLUCOSE 82 01/17/2022   BUN 16 01/17/2022   CREATININE 1.09 (H) 01/17/2022   CALCIUM 9.2 01/17/2022   EGFR 52 (L) 01/17/2022   GFRNONAA 80 01/12/2020   Lab Results  Component Value Date   CHOL 183 01/17/2022   HDL 66 01/17/2022   LDLCALC 101 (H) 01/17/2022   TRIG 86 01/17/2022   CHOLHDL 2.8 01/17/2022   Lab Results  Component Value Date   TSH 2.250 01/17/2022   Lab Results  Component Value Date   HGBA1C 6.2 (H) 01/17/2022   Lab Results  Component Value Date   WBC 5.1 01/17/2022   HGB 11.8 01/17/2022   HCT 35.5 01/17/2022   MCV 91 01/17/2022   PLT 261 01/17/2022   Lab Results  Component Value Date   ALT 15 01/17/2022   AST 18 01/17/2022   ALKPHOS 73 01/17/2022   BILITOT 0.4 01/17/2022   No results found for:  "25OHVITD2", "25OHVITD3", "VD25OH"   Review of Systems  Constitutional:  Negative for chills, fatigue and fever.  HENT:  Positive for postnasal drip and rhinorrhea. Negative for trouble swallowing.   Respiratory:  Positive for cough. Negative for shortness of breath and wheezing.   Cardiovascular:  Negative for chest pain.  Gastrointestinal:  Negative for diarrhea, nausea and vomiting.  Genitourinary:  Positive for dysuria, frequency and urgency. Negative for hematuria.  Neurological:  Positive for headaches.  Psychiatric/Behavioral:  Positive for sleep disturbance.     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   Primary osteoarthritis of left knee 09/17/2018   Generalized anxiety disorder 11/02/2017   Weight loss, abnormal 10/11/2017   Neuropathy 07/06/2016   SUI (stress urinary incontinence, female) 11/11/2015   Incomplete emptying of bladder 11/11/2015   Recurrent UTI 11/10/2015   Arthritis of knee, left 07/01/2015   Insomnia 06/30/2015   Atrophic vaginitis 06/30/2015   Gastroesophageal reflux disease 06/30/2015   Osteopenia 05/20/2015   Anemia 09/14/2012   Constipation 08/27/2012   Essential hypertension 10/04/2011   Hyperlipidemia, mixed 10/04/2011    Allergies  Allergen Reactions   Galactose Anaphylaxis, Anxiety, Diarrhea, Itching and Shortness Of Breath   Primidone Nausea  Only   Iodinated Contrast Media Hives   Oxycodone Nausea And Vomiting and Nausea Only    Past Surgical History:  Procedure Laterality Date   ANKLE ARTHROSCOPY WITH OPEN REDUCTION INTERNAL FIXATION (ORIF)  07/2015   tri-malleolar   BUNIONECTOMY Bilateral 2001, 2005   CHOLECYSTECTOMY     COLONOSCOPY  05/2010   normal   COLONOSCOPY WITH PROPOFOL N/A 10/12/2021   Procedure: COLONOSCOPY WITH PROPOFOL;  Surgeon: Annamaria Helling, DO;  Location: [facility] ENDOSCOPY;  Service: Gastroenterology;  Laterality: N/A;   GANGLION CYST EXCISION Left    THORACOTOMY / DECORTICATION PARIETAL PLEURA Left 06/2017   [facility]   TOTAL KNEE ARTHROPLASTY Left 11/08/2020   VATS Sleeve lobectomy Left 09/2011   typical variant carcinoid    Social History   Tobacco Use   Smoking status: Former    Packs/day: 0.25    Years: 20.00    Total pack years: 5.00    Types: Cigarettes    Quit date: 1985    Years since quitting: 39.0   Smokeless tobacco: Never   Tobacco comments:    smoking cessation materials not required  Vaping Use   Vaping Use: Never used  Substance Use Topics   Alcohol use: Not Currently    Alcohol/week: 1.0 standard drink of alcohol    Types: 1 Glasses of wine per week    Comment: stopped drinking wine 2 years   Drug use: No     Medication list has been reviewed and updated.  Current Meds  Medication Sig   Ascorbic Acid (VITAMIN C WITH ROSE HIPS) 500 MG tablet Take 500 mg by mouth daily.   buPROPion (WELLBUTRIN XL) 300 MG 24 hr tablet TAKE 1 TABLET BY MOUTH EVERY DAY   Calcium Carbonate-Vit D-Min (CALCIUM 1200 PO) Take 1,200 mg by mouth.   Calcium-Magnesium-Zinc 333-133-5 MG TABS Take by mouth.   cefUROXime (CEFTIN) 500 MG tablet Take 1 tablet (500 mg total) by mouth 2 (two) times daily with a meal for 10 days.   CHOLECALCIFEROL PO Take 1 tablet by mouth daily. 5000 iu   Elderberry 500 MG CAPS Take by mouth.   EPINEPHrine 0.3 mg/0.3 mL IJ SOAJ injection INJECT 0.3 MLS (0.3 MG TOTAL) INTO THE MUSCLE AS NEEDED (ANAPHYLAXIS). FOR UP TO 1 DOSE   gentamicin ointment (GARAMYCIN) 0.1 % 3 (three) times daily.   hydrochlorothiazide (HYDRODIURIL) 25 MG tablet TAKE 1 TABLET (25 MG TOTAL) BY MOUTH DAILY.   Melatonin 10 MG TABS Take by mouth at bedtime as needed.   Metoprolol Tartrate 37.5 MG TABS TAKE 37.5 MG BY MOUTH DAILY.   Misc Natural Products (TURMERIC CURCUMIN) CAPS Take 1 capsule by mouth daily. 1000 mg    Multiple Vitamin (MULTIVITAMIN WITH MINERALS) TABS tablet Take 1 tablet by mouth daily.   Omega-3 Fatty Acids (FISH OIL) 1000 MG CAPS Take by mouth.   promethazine-dextromethorphan (PROMETHAZINE-DM) 6.25-15 MG/5ML syrup Take 5 mLs by mouth 4 (four) times daily as needed for up to 9 days for cough.   psyllium (METAMUCIL) 58.6 % powder Take 1 packet by mouth as needed.   simvastatin (ZOCOR) 20 MG tablet Take 1 tablet (20 mg total) by mouth daily.   Turmeric 1053 MG TABS Take by mouth.       02/21/2022   11:18 AM 01/17/2022    9:54 AM 12/07/2021   10:20 AM 09/15/2021   11:13 AM  GAD 7 : Generalized Anxiety Score  Nervous, Anxious, on Edge 0 0 0 1  Control/stop worrying  0 0 0 0  Worry too much - different things 0 0 1 0  Trouble relaxing 0 0 0 0  Restless 0 0 0 0  Easily annoyed or irritable 0 0 0 0  Afraid - awful might happen 0 0 0 0  Total GAD 7 Score 0 0 1 1  Anxiety Difficulty Not difficult at all Not difficult at all Not difficult at all Not difficult at all       02/21/2022   11:18 AM 01/17/2022    9:54 AM 12/07/2021   10:20 AM  Depression screen PHQ 2/9  Decreased Interest 0 0 0  Down, Depressed, Hopeless 0 0 0  PHQ - 2 Score 0 0 0  Altered sleeping 0 0 0  Tired, decreased energy 0 0 0  Change in appetite 0 0 0  Feeling bad or failure about yourself  0 0 0  Trouble concentrating 0 0 0  Moving slowly or fidgety/restless 0 0 0  Suicidal thoughts 0 0 0  PHQ-9 Score 0 0 0  Difficult doing work/chores Not difficult at all Not difficult at all Not difficult at all    BP Readings from Last 3 Encounters:  02/21/22 126/62  02/17/22 129/70  01/17/22 118/70    Physical Exam Vitals and nursing note reviewed.  Constitutional:      Appearance: Normal appearance. She is well-developed.  HENT:     Nose:     Right Sinus: Maxillary sinus tenderness present.     Left Sinus: Maxillary sinus tenderness present.  Cardiovascular:     Rate and Rhythm: Normal rate and  regular rhythm.     Heart sounds: Normal heart sounds.  Pulmonary:     Effort: Pulmonary effort is normal. No respiratory distress.     Breath sounds: Normal breath sounds. No transmitted upper airway sounds. No decreased breath sounds or wheezing.  Abdominal:     General: Bowel sounds are normal.     Palpations: Abdomen is soft.     Tenderness: There is abdominal tenderness in the suprapubic area. There is no right CVA tenderness, left CVA tenderness, guarding or rebound.  Musculoskeletal:     Cervical back: Normal range of motion.     Wt Readings from Last 3 Encounters:  02/21/22 148 lb (67.1 kg)  02/17/22 147 lb 14.9 oz (67.1 kg)  01/17/22 148 lb (67.1 kg)    BP 126/62 (BP Location: Right Arm, Cuff Size: Normal)   Pulse 63   Temp 98.2 F (36.8 C) (Oral)   Ht _0  (1.626 m)   Wt 148 lb (67.1 kg)   SpO2 99%   BMI 25.40 kg/m   Assessment and Plan: Problem List Items Addressed This Visit   None Visit Diagnoses     UTI symptoms    -  Primary   dysuria and frequency started three days ago no N/V/D, no blood no flank pain Recommend to push fluids   Relevant Medications   cefUROXime (CEFTIN) 500 MG tablet   Other Relevant Orders   POCT urinalysis dipstick (Completed)   Urine Culture   Viral URI with cough       Seen in UC - Covid/flu neg still having cough - now more productive using Mucinex otc   Relevant Medications   promethazine-dextromethorphan (PROMETHAZINE-DM) 6.25-15 MG/5ML syrup   cefUROXime (CEFTIN) 500 MG tablet        Partially dictated using Editor, commissioning. Any errors are unintentional.  Halina Maidens, MD [facility]  02/21/2022

## 2022-02-27 LAB — URINE CULTURE

## 2022-03-06 DIAGNOSIS — D2261 Melanocytic nevi of right upper limb, including shoulder: Secondary | ICD-10-CM | POA: Diagnosis not present

## 2022-03-06 DIAGNOSIS — B001 Herpesviral vesicular dermatitis: Secondary | ICD-10-CM | POA: Diagnosis not present

## 2022-03-06 DIAGNOSIS — D2271 Melanocytic nevi of right lower limb, including hip: Secondary | ICD-10-CM | POA: Diagnosis not present

## 2022-03-06 DIAGNOSIS — L309 Dermatitis, unspecified: Secondary | ICD-10-CM | POA: Diagnosis not present

## 2022-03-06 DIAGNOSIS — D2262 Melanocytic nevi of left upper limb, including shoulder: Secondary | ICD-10-CM | POA: Diagnosis not present

## 2022-03-06 DIAGNOSIS — D225 Melanocytic nevi of trunk: Secondary | ICD-10-CM | POA: Diagnosis not present

## 2022-03-06 DIAGNOSIS — D2272 Melanocytic nevi of left lower limb, including hip: Secondary | ICD-10-CM | POA: Diagnosis not present

## 2022-03-09 ENCOUNTER — Encounter: Payer: Self-pay | Admitting: Family Medicine

## 2022-03-09 ENCOUNTER — Ambulatory Visit (INDEPENDENT_AMBULATORY_CARE_PROVIDER_SITE_OTHER): Payer: Medicare HMO | Admitting: Family Medicine

## 2022-03-09 VITALS — BP 138/84 | HR 76 | Ht 64.0 in | Wt 148.0 lb

## 2022-03-09 DIAGNOSIS — N3090 Cystitis, unspecified without hematuria: Secondary | ICD-10-CM

## 2022-03-09 DIAGNOSIS — R399 Unspecified symptoms and signs involving the genitourinary system: Secondary | ICD-10-CM | POA: Diagnosis not present

## 2022-03-09 LAB — POCT URINALYSIS DIPSTICK
Bilirubin, UA: NEGATIVE
Glucose, UA: NEGATIVE
Ketones, UA: NEGATIVE
Protein, UA: NEGATIVE
Spec Grav, UA: 1.025 (ref 1.010–1.025)
Urobilinogen, UA: 0.2 E.U./dL
pH, UA: 7 (ref 5.0–8.0)

## 2022-03-09 MED ORDER — METRONIDAZOLE 500 MG PO TABS: 500.0000 mg | ORAL_TABLET | Freq: Two times a day (BID) | ORAL | 0 refills | Status: AC

## 2022-03-09 MED ORDER — NITROFURANTOIN MONOHYD MACRO 100 MG PO CAPS: 100.0000 mg | ORAL_CAPSULE | Freq: Two times a day (BID) | ORAL | 0 refills | Status: DC

## 2022-03-09 NOTE — Progress Notes (Signed)
     Primary Care / Sports Medicine Office Visit  Patient Information:  Patient ID: Alexandra Watson, female DOB: 1945-06-16 Age: 77 y.o. MRN: [id number]   Alexandra Watson is a pleasant 77 y.o. female presenting with the following:  Chief Complaint  Patient presents with   Urinary Tract Infection    Was on antibiotics for cold was told it could cover UTI, doesn't think it got rid of it    Vitals:   03/09/22 1405  BP: 138/84  Pulse: 76  SpO2: 97%   Vitals:   03/09/22 1405  Weight: 148 lb (67.1 kg)  Height: 5\' 4"  (1.626 m)   Body mass index is 25.4 kg/m.  No results found.   Independent interpretation of notes and tests performed by another provider:   None  Procedures performed:   None  Pertinent History, Exam, Impression, and Recommendations:   Alexandra Watson was seen today for urinary tract infection.  Cystitis Assessment & Plan: Patient presents with persistent UTI symptoms, onset roughly 02/19/23 timeframe.  Of note when she presented to her PCP Dr. 02/21/23 on 02/21/2022 with the symptoms she had comorbid cough.  She was placed on cefuroxime with urinalysis and cultures ordered.  These results were reviewed.  Patient reports that while respiratory symptoms resolved, urinary symptoms relief was only a few days with rapid recurrence.  Specific she cites strong urine odor, cloudy appearance, denies fevers, chills, flank pain.  She denies any thick/whitish/yeast discharge.  Examination with benign abdominal findings, minimally tender bladder, no CVA tenderness.  Urinalysis findings consistent with recurrent cystitis, this will be sent for culture, history raises concern for concomitant bacterial vaginosis, will send Macrobid and Flagyl, supportive care encouraged, and we will contact her if culture results dictate medication change, and she can follow-up otherwise as needed.  Orders: -     Urine Culture -     POCT urinalysis dipstick -     Nitrofurantoin Monohyd Macro;  Take 1 capsule (100 mg total) by mouth 2 (two) times daily.  Dispense: 14 capsule; Refill: 0 -     metroNIDAZOLE; Take 1 tablet (500 mg total) by mouth 2 (two) times daily for 7 days.  Dispense: 14 tablet; Refill: 0     Orders & Medications Meds ordered this encounter  Medications   nitrofurantoin, macrocrystal-monohydrate, (MACROBID) 100 MG capsule    Sig: Take 1 capsule (100 mg total) by mouth 2 (two) times daily.    Dispense:  14 capsule    Refill:  0   metroNIDAZOLE (FLAGYL) 500 MG tablet    Sig: Take 1 tablet (500 mg total) by mouth 2 (two) times daily for 7 days.    Dispense:  14 tablet    Refill:  0   Orders Placed This Encounter  Procedures   Urine Culture   POCT urinalysis dipstick     No follow-ups on file.     02/23/2022, MD, [facility]   Primary Care Sports Medicine Primary Care and Sports Medicine at [facility]

## 2022-03-09 NOTE — Patient Instructions (Signed)
-  Take both antibiotics for full 7 day course - Review information attached - Stay hydrated and consider yogurt / probiotic intake - Reach out for any new, persistent, or rebound symptoms - Otherwise follow-up as-needed

## 2022-03-09 NOTE — Assessment & Plan Note (Signed)
Patient presents with persistent UTI symptoms, onset roughly 02/19/23 timeframe.  Of note when she presented to her PCP Dr. Judithann Graves on 02/21/2022 with the symptoms she had comorbid cough.  She was placed on cefuroxime with urinalysis and cultures ordered.  These results were reviewed.  Patient reports that while respiratory symptoms resolved, urinary symptoms relief was only a few days with rapid recurrence.  Specific she cites strong urine odor, cloudy appearance, denies fevers, chills, flank pain.  She denies any thick/whitish/yeast discharge.  Examination with benign abdominal findings, minimally tender bladder, no CVA tenderness.  Urinalysis findings consistent with recurrent cystitis, this will be sent for culture, history raises concern for concomitant bacterial vaginosis, will send Macrobid and Flagyl, supportive care encouraged, and we will contact her if culture results dictate medication change, and she can follow-up otherwise as needed.

## 2022-03-12 LAB — URINE CULTURE

## 2022-03-12 LAB — SPECIMEN STATUS REPORT

## 2022-04-01 ENCOUNTER — Other Ambulatory Visit: Payer: Self-pay | Admitting: Internal Medicine

## 2022-04-01 DIAGNOSIS — I10 Essential (primary) hypertension: Secondary | ICD-10-CM

## 2022-04-03 DIAGNOSIS — L309 Dermatitis, unspecified: Secondary | ICD-10-CM | POA: Diagnosis not present

## 2022-04-04 ENCOUNTER — Other Ambulatory Visit: Payer: Self-pay | Admitting: Internal Medicine

## 2022-04-04 DIAGNOSIS — E782 Mixed hyperlipidemia: Secondary | ICD-10-CM

## 2022-05-02 ENCOUNTER — Other Ambulatory Visit: Payer: Self-pay | Admitting: Internal Medicine

## 2022-05-02 DIAGNOSIS — F411 Generalized anxiety disorder: Secondary | ICD-10-CM

## 2022-05-02 NOTE — Telephone Encounter (Signed)
Requested Prescriptions  Pending Prescriptions Disp Refills   buPROPion (WELLBUTRIN XL) 300 MG 24 hr tablet [Pharmacy Med Name: BUPROPION HCL XL 300 MG TABLET] 100 tablet 0    Sig: TAKE 1 TABLET BY MOUTH EVERY DAY     Psychiatry: Antidepressants - bupropion Failed - 05/02/2022  1:45 AM      Failed - Cr in normal range and within 360 days    Creatinine  Date Value Ref Range Status  09/17/2011 0.87 0.60 - 1.30 mg/dL Final   Creatinine, Ser  Date Value Ref Range Status  01/17/2022 1.09 (H) 0.57 - 1.00 mg/dL Final         Passed - AST in normal range and within 360 days    AST  Date Value Ref Range Status  01/17/2022 18 0 - 40 IU/L Final   SGOT(AST)  Date Value Ref Range Status  09/17/2011 21 15 - 37 Unit/L Final         Passed - ALT in normal range and within 360 days    ALT  Date Value Ref Range Status  01/17/2022 15 0 - 32 IU/L Final   SGPT (ALT)  Date Value Ref Range Status  09/17/2011 32 U/L Final    Comment:    12-78 NOTE: NEW REFERENCE RANGE 01/19/2011          Passed - Last BP in normal range    BP Readings from Last 1 Encounters:  03/09/22 138/84         Passed - Valid encounter within last 6 months    Recent Outpatient Visits           1 month ago [place] [facility] [place], Earley Abide, MD   2 months ago UTI symptoms   [facility] Primary [place] at [facility], Jesse Sans, MD   3 months ago Annual physical exam   [place] at [facility], Jesse Sans, MD   4 months ago UTI symptoms   [facility] [place] at [facility], Jesse Sans, MD   7 months ago Essential hypertension   [place] at [facility], Jesse Sans, MD       Future Appointments             In 8 months [place] Melia, Jesse Sans, MD [place] at [facility],  [facility]

## 2022-07-12 DIAGNOSIS — C7A090 Malignant carcinoid tumor of the bronchus and lung: Secondary | ICD-10-CM | POA: Diagnosis not present

## 2022-07-12 DIAGNOSIS — R918 Other nonspecific abnormal finding of lung field: Secondary | ICD-10-CM | POA: Diagnosis not present

## 2022-07-12 DIAGNOSIS — C787 Secondary malignant neoplasm of liver and intrahepatic bile duct: Secondary | ICD-10-CM | POA: Diagnosis not present

## 2022-08-07 ENCOUNTER — Other Ambulatory Visit: Payer: Self-pay | Admitting: Internal Medicine

## 2022-08-07 DIAGNOSIS — F411 Generalized anxiety disorder: Secondary | ICD-10-CM

## 2022-08-08 NOTE — Telephone Encounter (Signed)
Requested Prescriptions  Pending Prescriptions Disp Refills   buPROPion (WELLBUTRIN XL) 300 MG 24 hr tablet [Pharmacy Med Name: BUPROPION HCL XL 300 MG TABLET] 100 tablet 1    Sig: TAKE 1 TABLET BY MOUTH EVERY DAY     Psychiatry: Antidepressants - bupropion Failed - 08/07/2022  2:50 AM      Failed - Cr in normal range and within 360 days    Creatinine  Date Value Ref Range Status  09/17/2011 0.87 0.60 - 1.30 mg/dL Final   Creatinine, Ser  Date Value Ref Range Status  01/17/2022 1.09 (H) 0.57 - 1.00 mg/dL Final         Passed - AST in normal range and within 360 days    AST  Date Value Ref Range Status  01/17/2022 18 0 - 40 IU/L Final   SGOT(AST)  Date Value Ref Range Status  09/17/2011 21 15 - 37 Unit/L Final         Passed - ALT in normal range and within 360 days    ALT  Date Value Ref Range Status  01/17/2022 15 0 - 32 IU/L Final   SGPT (ALT)  Date Value Ref Range Status  09/17/2011 32 U/L Final    Comment:    12-78 NOTE: NEW REFERENCE RANGE 01/19/2011          Passed - Last BP in normal range    BP Readings from Last 1 Encounters:  03/09/22 138/84         Passed - Valid encounter within last 6 months    Recent Outpatient Visits           5 months ago Cystitis   [place] [facility] at [facility] [place], Ocie Bob, MD   5 months ago UTI symptoms   [facility] Medicine at [facility], Nyoka Cowden, MD   6 months ago Annual physical exam   [facility] Care & Sports Medicine at [facility], Nyoka Cowden, MD   8 months ago UTI symptoms   [facility] Primary Care & Sports Medicine at [facility], Nyoka Cowden, MD   10 months ago Essential hypertension   [facility] Care & Sports Medicine at [facility], Nyoka Cowden, MD       Future Appointments             In 5 months Judithann Graves, Nyoka Cowden, MD [facility] Health Primary Care & Sports Medicine at [facility],  [facility]

## 2022-08-09 DIAGNOSIS — M48061 Spinal stenosis, lumbar region without neurogenic claudication: Secondary | ICD-10-CM | POA: Diagnosis not present

## 2022-08-09 DIAGNOSIS — M5416 Radiculopathy, lumbar region: Secondary | ICD-10-CM | POA: Diagnosis not present

## 2022-08-24 ENCOUNTER — Other Ambulatory Visit: Payer: Self-pay | Admitting: Internal Medicine

## 2022-08-24 DIAGNOSIS — I10 Essential (primary) hypertension: Secondary | ICD-10-CM

## 2022-08-28 DIAGNOSIS — M545 Low back pain, unspecified: Secondary | ICD-10-CM | POA: Insufficient documentation

## 2022-08-29 DIAGNOSIS — M5451 Vertebrogenic low back pain: Secondary | ICD-10-CM | POA: Diagnosis not present

## 2022-09-03 DIAGNOSIS — M5451 Vertebrogenic low back pain: Secondary | ICD-10-CM | POA: Diagnosis not present

## 2022-09-04 ENCOUNTER — Ambulatory Visit (INDEPENDENT_AMBULATORY_CARE_PROVIDER_SITE_OTHER): Payer: Medicare HMO | Admitting: Internal Medicine

## 2022-09-04 ENCOUNTER — Encounter: Payer: Self-pay | Admitting: Internal Medicine

## 2022-09-04 VITALS — BP 128/72 | HR 64 | Ht 64.0 in | Wt 147.0 lb

## 2022-09-04 DIAGNOSIS — C7A090 Malignant carcinoid tumor of the bronchus and lung: Secondary | ICD-10-CM

## 2022-09-04 DIAGNOSIS — K649 Unspecified hemorrhoids: Secondary | ICD-10-CM | POA: Insufficient documentation

## 2022-09-04 DIAGNOSIS — N1831 Chronic kidney disease, stage 3a: Secondary | ICD-10-CM | POA: Diagnosis not present

## 2022-09-04 DIAGNOSIS — I10 Essential (primary) hypertension: Secondary | ICD-10-CM | POA: Diagnosis not present

## 2022-09-04 DIAGNOSIS — Z91018 Allergy to other foods: Secondary | ICD-10-CM | POA: Diagnosis not present

## 2022-09-04 DIAGNOSIS — N3090 Cystitis, unspecified without hematuria: Secondary | ICD-10-CM

## 2022-09-04 LAB — POCT URINALYSIS DIPSTICK
Bilirubin, UA: NEGATIVE
Blood, UA: NEGATIVE
Glucose, UA: NEGATIVE
Ketones, UA: NEGATIVE
Nitrite, UA: POSITIVE — AB
Protein, UA: NEGATIVE
Spec Grav, UA: 1.015 (ref 1.010–1.025)
Urobilinogen, UA: 0.2 E.U./dL
pH, UA: 6 (ref 5.0–8.0)

## 2022-09-04 MED ORDER — HYDROCORTISONE (PERIANAL) 2.5 % EX CREA: 1.0000 | TOPICAL_CREAM | Freq: Two times a day (BID) | CUTANEOUS | 2 refills | Status: DC

## 2022-09-04 NOTE — Assessment & Plan Note (Signed)
Recently seen by Oncology Liver lesion slightly larger - plan to monitor and consider immunotherapy if it continues to enlarge Overall she feels well with good appetite and no chest and abdominal pain.

## 2022-09-04 NOTE — Assessment & Plan Note (Signed)
Stable exam with well controlled BP.  Currently taking metoprolol and hctz. Tolerating medications without concerns or side effects. Will continue to recommend low sodium diet and current regimen.

## 2022-09-04 NOTE — Progress Notes (Signed)
Date:  09/04/2022   Name:  Alexandra Watson   DOB:  [birth year]   MRN:  [id number]   Chief Complaint: Urinary Tract Infection (1-2 months of on and off cloudy urine. No pain.)  Urinary Tract Infection  This is a recurrent problem. The problem occurs intermittently. The problem has been gradually worsening. The patient is experiencing no pain. There has been no fever. Pertinent negatives include no flank pain, nausea, urgency or vomiting. Associated symptoms comments: Just cloudy urine.  Hemorrhoids - itching with no relief from PrepH.  No bleeding or change in bowel habits.  Last colonoscopy 2023 with one polyp. CKD - GFR decreased last check.  She take NSAIDS as needed.  Drinks water sufficiently.   Lab Results  Component Value Date   NA 137 01/17/2022   K 4.2 01/17/2022   CO2 25 01/17/2022   GLUCOSE 82 01/17/2022   BUN 16 01/17/2022   CREATININE 1.09 (H) 01/17/2022   CALCIUM 9.2 01/17/2022   EGFR 52 (L) 01/17/2022   GFRNONAA 80 01/12/2020   Lab Results  Component Value Date   CHOL 183 01/17/2022   HDL 66 01/17/2022   LDLCALC 101 (H) 01/17/2022   TRIG 86 01/17/2022   CHOLHDL 2.8 01/17/2022   Lab Results  Component Value Date   TSH 2.250 01/17/2022   Lab Results  Component Value Date   HGBA1C 6.2 (H) 01/17/2022   Lab Results  Component Value Date   WBC 5.1 01/17/2022   HGB 11.8 01/17/2022   HCT 35.5 01/17/2022   MCV 91 01/17/2022   PLT 261 01/17/2022   Lab Results  Component Value Date   ALT 15 01/17/2022   AST 18 01/17/2022   ALKPHOS 73 01/17/2022   BILITOT 0.4 01/17/2022   No results found for: "25OHVITD2", "25OHVITD3", "VD25OH"   Review of Systems  Constitutional:  Negative for fatigue and fever.  Respiratory:  Negative for chest tightness and shortness of breath.   Cardiovascular:  Negative for chest pain.  Gastrointestinal:  Negative for abdominal distention, abdominal pain, anal bleeding, nausea and vomiting. Rectal pain: rectal  itching. Genitourinary:  Negative for flank pain and urgency.  Musculoskeletal:  Positive for arthralgias and back pain. Negative for gait problem.  Skin:  Negative for rash.  Hematological:  Bruises/bleeds easily.  Psychiatric/Behavioral:  Negative for dysphoric mood and sleep disturbance. The patient is not nervous/anxious.     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   Primary osteoarthritis of left knee 09/17/2018   Generalized anxiety disorder 11/02/2017   Weight loss, abnormal 10/11/2017   Neuropathy 07/06/2016   SUI (stress urinary incontinence, female) 11/11/2015   Incomplete emptying of bladder 11/11/2015   Cystitis 11/11/2015   Recurrent UTI 11/10/2015   Arthritis of knee, left 07/01/2015   Insomnia 06/30/2015   Atrophic vaginitis 06/30/2015   Gastroesophageal reflux disease 06/30/2015   Osteopenia 05/20/2015   Anemia 09/14/2012   Constipation 08/27/2012   Essential hypertension 10/04/2011   Hyperlipidemia, mixed 10/04/2011    Allergies  Allergen Reactions   Galactose Anaphylaxis, Anxiety, Diarrhea, Itching and Shortness Of Breath   Primidone Nausea Only   Iodinated Contrast Media Hives   Oxycodone Nausea And Vomiting and Nausea Only    Past Surgical  History:  Procedure Laterality Date   ANKLE ARTHROSCOPY WITH OPEN REDUCTION INTERNAL FIXATION (ORIF)  07/2015   tri-malleolar   BUNIONECTOMY Bilateral 2001, 2005   CHOLECYSTECTOMY     COLONOSCOPY  05/2010   normal   COLONOSCOPY WITH PROPOFOL N/A 10/12/2021   Procedure: COLONOSCOPY WITH PROPOFOL;  Surgeon: Jaynie Collins, DO;  Location: [facility] ENDOSCOPY;  Service: Gastroenterology;  Laterality: N/A;   GANGLION CYST EXCISION Left    THORACOTOMY /  DECORTICATION PARIETAL PLEURA Left 06/2017   [facility]   TOTAL KNEE ARTHROPLASTY Left 11/08/2020   VATS Sleeve lobectomy Left 09/2011   typical variant carcinoid    Social History   Tobacco Use   Smoking status: Former    Packs/day: 0.25    Years: 20.00    Additional pack years: 0.00    Total pack years: 5.00    Types: Cigarettes    Quit date: 1985    Years since quitting: 39.5   Smokeless tobacco: Never   Tobacco comments:    smoking cessation materials not required  Vaping Use   Vaping Use: Never used  Substance Use Topics   Alcohol use: Not Currently    Alcohol/week: 1.0 standard drink of alcohol    Types: 1 Glasses of wine per week    Comment: stopped drinking wine 2 years   Drug use: No     Medication list has been reviewed and updated.  Current Meds  Medication Sig   Ascorbic Acid (VITAMIN C WITH ROSE HIPS) 500 MG tablet Take 500 mg by mouth daily.   buPROPion (WELLBUTRIN XL) 300 MG 24 hr tablet TAKE 1 TABLET BY MOUTH EVERY DAY   Calcium Carbonate-Vit D-Min (CALCIUM 1200 PO) Take 1,200 mg by mouth.   Calcium-Magnesium-Zinc 333-133-5 MG TABS Take by mouth.   CHOLECALCIFEROL PO Take 1 tablet by mouth daily. 5000 iu   Elderberry 500 MG CAPS Take by mouth.   EPINEPHrine 0.3 mg/0.3 mL IJ SOAJ injection INJECT 0.3 MLS (0.3 MG TOTAL) INTO THE MUSCLE AS NEEDED (ANAPHYLAXIS). FOR UP TO 1 DOSE   gentamicin ointment (GARAMYCIN) 0.1 % 3 (three) times daily.   hydrochlorothiazide (HYDRODIURIL) 25 MG tablet TAKE 1 TABLET (25 MG TOTAL) BY MOUTH DAILY.   hydrocortisone (ANUSOL-HC) 2.5 % rectal cream Place 1 Application rectally 2 (two) times daily.   Melatonin 10 MG TABS Take by mouth at bedtime as needed.   Metoprolol Tartrate 37.5 MG TABS TAKE 1 TABLET BY MOUTH DAILY.   Misc Natural Products (TURMERIC CURCUMIN) CAPS Take 1 capsule by mouth daily. 1000 mg   Multiple Vitamin (MULTIVITAMIN WITH MINERALS) TABS tablet Take 1 tablet by mouth daily.   Omega-3 Fatty Acids (FISH OIL) 1000  MG CAPS Take by mouth.   psyllium (METAMUCIL) 58.6 % powder Take 1 packet by mouth as needed.   simvastatin (ZOCOR) 20 MG tablet TAKE 1 TABLET BY MOUTH EVERY DAY   Turmeric 1053 MG TABS Take by mouth.       09/04/2022    4:13 PM 02/21/2022   11:18 AM 01/17/2022    9:54 AM 12/07/2021   10:20 AM  GAD 7 : Generalized Anxiety Score  Nervous, Anxious, on Edge 0 0 0 0  Control/stop worrying 0 0 0 0  Worry too much - different things 0 0 0 1  Trouble relaxing 0 0 0 0  Restless 0 0 0 0  Easily annoyed or irritable 0 0 0 0  Afraid - awful might happen 0 0 0 0  Total  GAD 7 Score 0 0 0 1  Anxiety Difficulty Not difficult at all Not difficult at all Not difficult at all Not difficult at all       09/04/2022    4:13 PM 02/21/2022   11:18 AM 01/17/2022    9:54 AM  Depression screen PHQ 2/9  Decreased Interest 0 0 0  Down, Depressed, Hopeless 0 0 0  PHQ - 2 Score 0 0 0  Altered sleeping 0 0 0  Tired, decreased energy 0 0 0  Change in appetite 0 0 0  Feeling bad or failure about yourself  0 0 0  Trouble concentrating 0 0 0  Moving slowly or fidgety/restless 0 0 0  Suicidal thoughts 0 0 0  PHQ-9 Score 0 0 0  Difficult doing work/chores Not difficult at all Not difficult at all Not difficult at all    BP Readings from Last 3 Encounters:  09/04/22 128/72  03/09/22 138/84  02/21/22 126/62    Physical Exam Vitals and nursing note reviewed.  Constitutional:      General: She is not in acute distress.    Appearance: Normal appearance. She is well-developed.  HENT:     Head: Normocephalic and atraumatic.  Cardiovascular:     Rate and Rhythm: Normal rate and regular rhythm.  Pulmonary:     Effort: Pulmonary effort is normal. No respiratory distress.     Breath sounds: No wheezing or rhonchi.  Abdominal:     General: Abdomen is flat.     Palpations: Abdomen is soft.     Tenderness: There is no abdominal tenderness. There is no right CVA tenderness or left CVA tenderness.   Musculoskeletal:     Right lower leg: No edema.     Left lower leg: No edema.  Skin:    General: Skin is warm and dry.     Findings: No rash.  Neurological:     General: No focal deficit present.     Mental Status: She is alert and oriented to person, place, and time.  Psychiatric:        Mood and Affect: Mood normal.        Behavior: Behavior normal.     Wt Readings from Last 3 Encounters:  09/04/22 147 lb (66.7 kg)  03/09/22 148 lb (67.1 kg)  02/21/22 148 lb (67.1 kg)    BP 128/72 (BP Location: Right Arm, Cuff Size: Large)   Pulse 64   Ht 5\' 4"  (1.626 m)   Wt 147 lb (66.7 kg)   SpO2 96%   BMI 25.23 kg/m   Assessment and Plan:  Problem List Items Addressed This Visit     Hemorrhoids    Seen on Colonoscopy last year Irritated by bowel movements with itching but no bleeding Will Rx anusol cream bid prn      Relevant Medications   hydrocortisone (ANUSOL-HC) 2.5 % rectal cream   Essential hypertension (Chronic)    Stable exam with well controlled BP.  Currently taking metoprolol and hctz. Tolerating medications without concerns or side effects. Will continue to recommend low sodium diet and current regimen.       Cystitis - Primary   Relevant Orders   POCT urinalysis dipstick (Completed)   Urine Culture   CBC with Differential/Platelet   CKD stage 3a, GFR 45-59 ml/min (HCC)    Noted on last last fall. Recommend avoiding regular doses of NSAIDS and increased fluid intake Will recheck labs      Relevant Orders  Basic metabolic panel   Atypical carcinoid lung tumor (HCC) (Chronic)    Recently seen by Oncology Liver lesion slightly larger - plan to monitor and consider immunotherapy if it continues to enlarge Overall she feels well with good appetite and no chest and abdominal pain.      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   Partially dictated using Dragon software, any errors are not intentional.  Reubin Milan, MD [facility] Primary Care and Sports Medicine [place], [place]

## 2022-09-04 NOTE — Assessment & Plan Note (Signed)
Seen on Colonoscopy last year Irritated by bowel movements with itching but no bleeding Will Rx anusol cream bid prn

## 2022-09-04 NOTE — Assessment & Plan Note (Signed)
Now eating some pork but still avoiding beef, goat and lamb Will repeat panel

## 2022-09-04 NOTE — Assessment & Plan Note (Signed)
Noted on last last fall. Recommend avoiding regular doses of NSAIDS and increased fluid intake Will recheck labs

## 2022-09-05 DIAGNOSIS — M5451 Vertebrogenic low back pain: Secondary | ICD-10-CM | POA: Diagnosis not present

## 2022-09-05 DIAGNOSIS — G5603 Carpal tunnel syndrome, bilateral upper limbs: Secondary | ICD-10-CM | POA: Diagnosis not present

## 2022-09-06 LAB — CBC WITH DIFFERENTIAL/PLATELET
Basophils Absolute: 0 10*3/uL (ref 0.0–0.2)
Basos: 1 %
EOS (ABSOLUTE): 0.1 10*3/uL (ref 0.0–0.4)
Eos: 1 %
Hematocrit: 31.4 % — ABNORMAL LOW (ref 34.0–46.6)
Hemoglobin: 10.3 g/dL — ABNORMAL LOW (ref 11.1–15.9)
Immature Grans (Abs): 0 10*3/uL (ref 0.0–0.1)
Immature Granulocytes: 0 %
Lymphocytes Absolute: 1.2 10*3/uL (ref 0.7–3.1)
Lymphs: 23 %
MCH: 29.6 pg (ref 26.6–33.0)
MCHC: 32.8 g/dL (ref 31.5–35.7)
MCV: 90 fL (ref 79–97)
Monocytes Absolute: 0.6 10*3/uL (ref 0.1–0.9)
Monocytes: 11 %
Neutrophils Absolute: 3.4 10*3/uL (ref 1.4–7.0)
Neutrophils: 64 %
Platelets: 254 10*3/uL (ref 150–450)
RBC: 3.48 x10E6/uL — ABNORMAL LOW (ref 3.77–5.28)
RDW: 13.1 % (ref 11.7–15.4)
WBC: 5.3 10*3/uL (ref 3.4–10.8)

## 2022-09-06 LAB — BASIC METABOLIC PANEL
BUN/Creatinine Ratio: 20 (ref 12–28)
BUN: 19 mg/dL (ref 8–27)
CO2: 24 mmol/L (ref 20–29)
Calcium: 9.2 mg/dL (ref 8.7–10.3)
Chloride: 99 mmol/L (ref 96–106)
Creatinine, Ser: 0.94 mg/dL (ref 0.57–1.00)
Glucose: 91 mg/dL (ref 70–99)
Potassium: 4 mmol/L (ref 3.5–5.2)
Sodium: 138 mmol/L (ref 134–144)
eGFR: 62 mL/min/{1.73_m2} (ref >59)

## 2022-09-06 LAB — ALPHA-GAL PANEL
Allergen Lamb IgE: 0.12 kU/L — AB
Beef IgE: 0.12 kU/L — AB
IgE (Immunoglobulin E), Serum: 11 IU/mL (ref 6–495)
O215-IgE Alpha-Gal: 0.21 kU/L — AB
Pork IgE: <0.1 kU/L

## 2022-09-08 LAB — URINE CULTURE

## 2022-09-11 ENCOUNTER — Other Ambulatory Visit: Payer: Self-pay | Admitting: Internal Medicine

## 2022-09-11 DIAGNOSIS — N3000 Acute cystitis without hematuria: Secondary | ICD-10-CM

## 2022-09-11 MED ORDER — CIPROFLOXACIN HCL 250 MG PO TABS
250.0000 mg | ORAL_TABLET | Freq: Two times a day (BID) | ORAL | 0 refills | Status: AC
Start: 2022-09-11 — End: 2022-09-16

## 2022-09-11 NOTE — Progress Notes (Signed)
Pt called. Pt aware. Pt wants meds sent to [facility] in [place].  KP

## 2022-09-13 DIAGNOSIS — M5451 Vertebrogenic low back pain: Secondary | ICD-10-CM | POA: Diagnosis not present

## 2022-09-21 DIAGNOSIS — M5451 Vertebrogenic low back pain: Secondary | ICD-10-CM | POA: Diagnosis not present

## 2022-09-28 DIAGNOSIS — M5451 Vertebrogenic low back pain: Secondary | ICD-10-CM | POA: Diagnosis not present

## 2022-10-11 DIAGNOSIS — J3489 Other specified disorders of nose and nasal sinuses: Secondary | ICD-10-CM | POA: Diagnosis not present

## 2022-10-11 DIAGNOSIS — R040 Epistaxis: Secondary | ICD-10-CM | POA: Diagnosis not present

## 2022-10-12 ENCOUNTER — Other Ambulatory Visit: Payer: Self-pay | Admitting: Internal Medicine

## 2022-10-12 DIAGNOSIS — E782 Mixed hyperlipidemia: Secondary | ICD-10-CM

## 2022-10-12 DIAGNOSIS — M5451 Vertebrogenic low back pain: Secondary | ICD-10-CM | POA: Diagnosis not present

## 2022-10-12 NOTE — Telephone Encounter (Signed)
Requested Prescriptions  Pending Prescriptions Disp Refills   simvastatin (ZOCOR) 20 MG tablet [Pharmacy Med Name: SIMVASTATIN 20 MG TABLET] 100 tablet 0    Sig: TAKE 1 TABLET BY MOUTH EVERY DAY     Cardiovascular:  Antilipid - Statins Failed - 10/12/2022  1:30 AM      Failed - Lipid Panel in normal range within the last 12 months    Cholesterol, Total  Date Value Ref Range Status  01/17/2022 183 100 - 199 mg/dL Final   LDL Chol Calc (NIH)  Date Value Ref Range Status  01/17/2022 101 (H) 0 - 99 mg/dL Final   HDL  Date Value Ref Range Status  01/17/2022 66 >39 mg/dL Final   Triglycerides  Date Value Ref Range Status  01/17/2022 86 0 - 149 mg/dL Final         Passed - Patient is not pregnant      Passed - Valid encounter within last 12 months    Recent Outpatient Visits           1 month ago Cystitis   [place] Primary Care & Sports Medicine at [facility], Nyoka Cowden, MD   7 months ago Cystitis   [facility] Primary Care & Sports Medicine at [facility], Ocie Bob, MD   7 months ago UTI symptoms   [facility] Primary Care & Sports Medicine at [facility], Nyoka Cowden, MD   8 months ago Annual physical exam   [facility] Medicine at [facility], Nyoka Cowden, MD   10 months ago UTI symptoms   [facility] Primary Care & Sports Medicine at [facility], Nyoka Cowden, MD       Future Appointments             In 3 months Judithann Graves, Nyoka Cowden, MD [facility] Health Primary Care & Sports Medicine at [facility], [facility]

## 2022-10-25 DIAGNOSIS — R040 Epistaxis: Secondary | ICD-10-CM | POA: Diagnosis not present

## 2022-11-08 DIAGNOSIS — G5621 Lesion of ulnar nerve, right upper limb: Secondary | ICD-10-CM | POA: Diagnosis not present

## 2022-11-08 DIAGNOSIS — G5603 Carpal tunnel syndrome, bilateral upper limbs: Secondary | ICD-10-CM | POA: Diagnosis not present

## 2022-11-27 DIAGNOSIS — G5620 Lesion of ulnar nerve, unspecified upper limb: Secondary | ICD-10-CM | POA: Insufficient documentation

## 2022-11-27 DIAGNOSIS — G5603 Carpal tunnel syndrome, bilateral upper limbs: Secondary | ICD-10-CM | POA: Insufficient documentation

## 2022-11-27 DIAGNOSIS — G5600 Carpal tunnel syndrome, unspecified upper limb: Secondary | ICD-10-CM | POA: Insufficient documentation

## 2022-11-27 DIAGNOSIS — G5621 Lesion of ulnar nerve, right upper limb: Secondary | ICD-10-CM | POA: Diagnosis not present

## 2022-11-30 DIAGNOSIS — L538 Other specified erythematous conditions: Secondary | ICD-10-CM | POA: Diagnosis not present

## 2022-11-30 DIAGNOSIS — D2262 Melanocytic nevi of left upper limb, including shoulder: Secondary | ICD-10-CM | POA: Diagnosis not present

## 2022-11-30 DIAGNOSIS — L820 Inflamed seborrheic keratosis: Secondary | ICD-10-CM | POA: Diagnosis not present

## 2022-11-30 DIAGNOSIS — D2261 Melanocytic nevi of right upper limb, including shoulder: Secondary | ICD-10-CM | POA: Diagnosis not present

## 2022-11-30 DIAGNOSIS — L814 Other melanin hyperpigmentation: Secondary | ICD-10-CM | POA: Diagnosis not present

## 2022-11-30 DIAGNOSIS — L2989 Other pruritus: Secondary | ICD-10-CM | POA: Diagnosis not present

## 2022-11-30 DIAGNOSIS — D225 Melanocytic nevi of trunk: Secondary | ICD-10-CM | POA: Diagnosis not present

## 2022-11-30 DIAGNOSIS — D485 Neoplasm of uncertain behavior of skin: Secondary | ICD-10-CM | POA: Diagnosis not present

## 2022-11-30 DIAGNOSIS — L821 Other seborrheic keratosis: Secondary | ICD-10-CM | POA: Diagnosis not present

## 2022-12-05 ENCOUNTER — Encounter: Payer: Self-pay | Admitting: Internal Medicine

## 2022-12-05 ENCOUNTER — Ambulatory Visit: Payer: Medicare HMO | Admitting: Internal Medicine

## 2022-12-05 VITALS — BP 124/72 | HR 70 | Ht 64.0 in | Wt 147.0 lb

## 2022-12-05 DIAGNOSIS — R300 Dysuria: Secondary | ICD-10-CM

## 2022-12-05 LAB — POCT URINALYSIS DIPSTICK
Bilirubin, UA: NEGATIVE
Glucose, UA: NEGATIVE
Ketones, UA: NEGATIVE
Nitrite, UA: NEGATIVE
Protein, UA: NEGATIVE
Spec Grav, UA: 1.015 (ref 1.010–1.025)
Urobilinogen, UA: 0.2 U/dL
pH, UA: 6 (ref 5.0–8.0)

## 2022-12-05 MED ORDER — CEFUROXIME AXETIL 250 MG PO TABS
250.0000 mg | ORAL_TABLET | Freq: Two times a day (BID) | ORAL | 0 refills | Status: AC
Start: 2022-12-05 — End: 2022-12-12

## 2022-12-05 MED ORDER — RSVPREF3 VAC RECOMB ADJUVANTED 120 MCG/0.5ML IM SUSR
0.5000 mL | Freq: Once | INTRAMUSCULAR | 0 refills | Status: AC
Start: 2022-12-05 — End: 2022-12-05

## 2022-12-05 NOTE — Progress Notes (Signed)
Date:  12/05/2022   Name:  Alexandra Watson   DOB:  1945/08/26   MRN:  [id number]   Chief Complaint: Urinary Tract Infection (Urgency, and burning while urinating. Started last night.)  Urinary Tract Infection  This is a new problem. The current episode started yesterday. The problem occurs every urination. The problem has been unchanged. The quality of the pain is described as burning. The pain is mild. There has been no fever. Associated symptoms include chills, frequency and urgency. Pertinent negatives include no flank pain, hematuria, nausea or vomiting.    Review of Systems  Constitutional:  Positive for chills. Negative for fatigue and fever.  Respiratory:  Negative for chest tightness.   Cardiovascular:  Negative for chest pain and palpitations.  Gastrointestinal:  Positive for diarrhea. Negative for constipation, nausea and vomiting.  Genitourinary:  Positive for dysuria, frequency and urgency. Negative for flank pain and hematuria.  Psychiatric/Behavioral:  Positive for dysphoric mood. Negative for sleep disturbance.      Lab Results  Component Value Date   NA 138 09/04/2022   K 4.0 09/04/2022   CO2 24 09/04/2022   GLUCOSE 91 09/04/2022   BUN 19 09/04/2022   CREATININE 0.94 09/04/2022   CALCIUM 9.2 09/04/2022   EGFR 62 09/04/2022   GFRNONAA 80 01/12/2020   Lab Results  Component Value Date   CHOL 183 01/17/2022   HDL 66 01/17/2022   LDLCALC 101 (H) 01/17/2022   TRIG 86 01/17/2022   CHOLHDL 2.8 01/17/2022   Lab Results  Component Value Date   TSH 2.250 01/17/2022   Lab Results  Component Value Date   HGBA1C 6.2 (H) 01/17/2022   Lab Results  Component Value Date   WBC 5.3 09/04/2022   HGB 10.3 (L) 09/04/2022   HCT 31.4 (L) 09/04/2022   MCV 90 09/04/2022   PLT 254 09/04/2022   Lab Results  Component Value Date   ALT 15 01/17/2022   AST 18 01/17/2022   ALKPHOS 73 01/17/2022   BILITOT 0.4 01/17/2022   No results found for: "25OHVITD2",  "25OHVITD3", "VD25OH"   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   Primary osteoarthritis of left knee 09/17/2018   Generalized anxiety disorder 11/02/2017   Weight loss, abnormal 10/11/2017   Neuropathy 07/06/2016   SUI (stress urinary incontinence, female) 11/11/2015   Incomplete emptying of bladder 11/11/2015   Cystitis 11/11/2015   Recurrent UTI 11/10/2015   Arthritis of knee, left 07/01/2015   Insomnia 06/30/2015   Atrophic vaginitis 06/30/2015   Gastroesophageal reflux disease 06/30/2015   Osteopenia 05/20/2015   Anemia 09/14/2012   Constipation 08/27/2012   Essential hypertension 10/04/2011   Hyperlipidemia, mixed 10/04/2011    Allergies  Allergen Reactions   Galactose Anaphylaxis, Anxiety, Diarrhea, Itching and Shortness Of Breath   Primidone Nausea Only   Iodinated Contrast Media Hives   Oxycodone Nausea And Vomiting and Nausea Only    Past Surgical History:  Procedure Laterality Date   ANKLE ARTHROSCOPY WITH OPEN REDUCTION INTERNAL FIXATION (ORIF)  07/2015   tri-malleolar   BUNIONECTOMY Bilateral 2001, 2005   CHOLECYSTECTOMY     COLONOSCOPY  05/2010   normal   COLONOSCOPY WITH PROPOFOL N/A 10/12/2021   Procedure: COLONOSCOPY WITH PROPOFOL;  Surgeon: Jaynie Collins, DO;  Location: [facility] ENDOSCOPY;  Service: Gastroenterology;  Laterality: N/A;   GANGLION CYST EXCISION Left    THORACOTOMY / DECORTICATION PARIETAL PLEURA Left 06/2017   [facility]   TOTAL KNEE ARTHROPLASTY Left 11/08/2020   VATS Sleeve lobectomy Left 09/2011   typical variant carcinoid    Social History   Tobacco Use   Smoking status: Former    Current packs/day: 0.00    Average packs/day: 0.3 packs/day for 20.0 years (5.0 ttl pk-yrs)     Types: Cigarettes    Start date: 3    Quit date: 81    Years since quitting: 39.7   Smokeless tobacco: Never   Tobacco comments:    smoking cessation materials not required  Vaping Use   Vaping status: Never Used  Substance Use Topics   Alcohol use: Not Currently    Alcohol/week: 1.0 standard drink of alcohol    Types: 1 Glasses of wine per week    Comment: stopped drinking wine 2 years   Drug use: No     Medication list has been reviewed and updated.  Current Meds  Medication Sig   cefUROXime (CEFTIN) 250 MG tablet Take 1 tablet (250 mg total) by mouth 2 (two) times daily with a meal for 7 days.   RSV vaccine recomb adjuvanted (AREXVY) 120 MCG/0.5ML injection Inject 0.5 mLs into the muscle once for 1 dose.       09/04/2022    4:13 PM 02/21/2022   11:18 AM 01/17/2022    9:54 AM 12/07/2021   10:20 AM  GAD 7 : Generalized Anxiety Score  Nervous, Anxious, on Edge 0 0 0 0  Control/stop worrying 0 0 0 0  Worry too much - different things 0 0 0 1  Trouble relaxing 0 0 0 0  Restless 0 0 0 0  Easily annoyed or irritable 0 0 0 0  Afraid - awful might happen 0 0 0 0  Total GAD 7 Score 0 0 0 1  Anxiety Difficulty Not difficult at all Not difficult at all Not difficult at all Not difficult at all       09/04/2022    4:13 PM 02/21/2022   11:18 AM 01/17/2022    9:54 AM  Depression screen PHQ 2/9  Decreased Interest 0 0 0  Down, Depressed, Hopeless 0 0 0  PHQ - 2 Score 0 0 0  Altered sleeping 0 0 0  Tired, decreased energy 0 0 0  Change in appetite 0 0 0  Feeling bad or failure about yourself  0 0 0  Trouble concentrating 0 0 0  Moving slowly or fidgety/restless 0 0 0  Suicidal thoughts 0 0 0  PHQ-9 Score 0 0 0  Difficult doing work/chores Not difficult at all Not difficult at all Not difficult at all    BP Readings from Last 3 Encounters:  12/05/22 124/72  09/04/22 128/72  03/09/22 138/84    Physical Exam Vitals and nursing note reviewed.  Constitutional:       Appearance: She is well-developed.  Cardiovascular:     Rate and Rhythm: Normal rate and regular rhythm.     Heart sounds: Normal heart sounds.  Pulmonary:     Effort: Pulmonary effort is normal. No respiratory distress.     Breath sounds: Normal breath sounds.  Abdominal:     General: Bowel sounds are normal.     Palpations: Abdomen is soft.     Tenderness: There is abdominal tenderness  in the suprapubic area. There is no right CVA tenderness, left CVA tenderness, guarding or rebound.  Neurological:     General: No focal deficit present.    Lab Results  Component Value Date   COLORU yellow 12/05/2022   CLARITYU cloudy 12/05/2022   GLUCOSEUR Negative 12/05/2022   BILIRUBINUR neg 12/05/2022   KETONESU neg 12/05/2022   SPECGRAV 1.015 12/05/2022   RBCUR moderate 2+ 12/05/2022   PHUR 6.0 12/05/2022   PROTEINUR Negative 12/05/2022   UROBILINOGEN 0.2 12/05/2022   LEUKOCYTESUR Large (3+) (A) 12/05/2022     Wt Readings from Last 3 Encounters:  12/05/22 147 lb (66.7 kg)  09/04/22 147 lb (66.7 kg)  03/09/22 148 lb (67.1 kg)    BP 124/72   Pulse 70   Ht 5\' 4"  (1.626 m)   Wt 147 lb (66.7 kg)   SpO2 100%   BMI 25.23 kg/m   Assessment and Plan:  Problem List Items Addressed This Visit   None Visit Diagnoses     Dysuria    -  Primary   UA infected; recommend increased fluids will treat presumptively with Ceftin send for culture   Relevant Medications   cefUROXime (CEFTIN) 250 MG tablet   Other Relevant Orders   POCT Urinalysis Dipstick (Completed)   Urine Culture       No follow-ups on file.    Reubin Milan, MD [facility] Health Primary Care and Sports Medicine [place]

## 2022-12-07 LAB — URINE CULTURE

## 2022-12-13 ENCOUNTER — Ambulatory Visit: Payer: Medicare HMO | Admitting: Internal Medicine

## 2022-12-13 ENCOUNTER — Encounter: Payer: Self-pay | Admitting: Internal Medicine

## 2022-12-13 VITALS — BP 126/66 | HR 74 | Temp 98.1°F | Ht 64.0 in | Wt 147.0 lb

## 2022-12-13 DIAGNOSIS — R300 Dysuria: Secondary | ICD-10-CM | POA: Diagnosis not present

## 2022-12-13 LAB — POCT URINALYSIS DIPSTICK
Bilirubin, UA: NEGATIVE
Glucose, UA: NEGATIVE
Ketones, UA: NEGATIVE
Nitrite, UA: POSITIVE
Protein, UA: POSITIVE — AB
Spec Grav, UA: 1.025 (ref 1.010–1.025)
Urobilinogen, UA: 0.2 U/dL
pH, UA: 5 (ref 5.0–8.0)

## 2022-12-13 MED ORDER — CIPROFLOXACIN HCL 250 MG PO TABS
250.0000 mg | ORAL_TABLET | Freq: Two times a day (BID) | ORAL | 0 refills | Status: DC
Start: 2022-12-13 — End: 2022-12-18

## 2022-12-13 NOTE — Progress Notes (Signed)
Date:  12/13/2022   Name:  Alexandra Watson   DOB:  Aug 29, 1945   MRN:  [id number]   Chief Complaint: Urinary Tract Infection (Patient still c/o of pain, and pressure while urinating. Cloudy Urine.)  Urinary Tract Infection  This is a chronic problem. The problem occurs every urination. The quality of the pain is described as burning. The pain is mild. There has been no fever. Associated symptoms include chills, frequency and urgency. Pertinent negatives include no hematuria. recent culture was negative; antibiotics complete today with no benefit  Last culture was mixed flora and UA + for leuks. Today UA shows leuks, nitrates and blood.  Lab Results  Component Value Date   COLORU yellow 12/13/2022   CLARITYU cloudy 12/13/2022   GLUCOSEUR Negative 12/13/2022   BILIRUBINUR neg 12/13/2022   KETONESU neg 12/13/2022   SPECGRAV 1.025 12/13/2022   RBCUR large 3+ 12/13/2022   PHUR 5.0 12/13/2022   PROTEINUR Positive (A) 12/13/2022   UROBILINOGEN 0.2 12/13/2022   LEUKOCYTESUR Large (3+) (A) 12/13/2022     Review of Systems  Constitutional:  Positive for chills. Negative for diaphoresis, fatigue and fever.  Respiratory:  Positive for cough. Negative for chest tightness and shortness of breath.   Cardiovascular:  Negative for chest pain.  Gastrointestinal:  Negative for abdominal pain.  Genitourinary:  Positive for dysuria, frequency and urgency. Negative for difficulty urinating and hematuria.     Lab Results  Component Value Date   NA 138 09/04/2022   K 4.0 09/04/2022   CO2 24 09/04/2022   GLUCOSE 91 09/04/2022   BUN 19 09/04/2022   CREATININE 0.94 09/04/2022   CALCIUM 9.2 09/04/2022   EGFR 62 09/04/2022   GFRNONAA 80 01/12/2020   Lab Results  Component Value Date   CHOL 183 01/17/2022   HDL 66 01/17/2022   LDLCALC 101 (H) 01/17/2022   TRIG 86 01/17/2022   CHOLHDL 2.8 01/17/2022   Lab Results  Component Value Date   TSH 2.250 01/17/2022   Lab Results  Component  Value Date   HGBA1C 6.2 (H) 01/17/2022   Lab Results  Component Value Date   WBC 5.3 09/04/2022   HGB 10.3 (L) 09/04/2022   HCT 31.4 (L) 09/04/2022   MCV 90 09/04/2022   PLT 254 09/04/2022   Lab Results  Component Value Date   ALT 15 01/17/2022   AST 18 01/17/2022   ALKPHOS 73 01/17/2022   BILITOT 0.4 01/17/2022   No results found for: "25OHVITD2", "25OHVITD3", "VD25OH"   Patient Active Problem List   Diagnosis Date Noted  . CKD stage 3a, GFR 45-59 ml/min (HCC) 09/04/2022  . Hemorrhoids 09/04/2022  . Benign essential tremor 01/10/2021  . H/O total knee replacement, left 11/08/2020  . Allergy to alpha-gal 06/17/2019  . 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  . Primary osteoarthritis of left knee 09/17/2018  . Generalized anxiety disorder 11/02/2017  . Weight loss, abnormal 10/11/2017  . Neuropathy 07/06/2016  . SUI (stress urinary incontinence, female) 11/11/2015  . Incomplete emptying of bladder 11/11/2015  . Cystitis 11/11/2015  . Recurrent UTI 11/10/2015  . Arthritis of knee, left 07/01/2015  . Insomnia 06/30/2015  . Atrophic vaginitis 06/30/2015  . Gastroesophageal reflux disease 06/30/2015  . Osteopenia 05/20/2015  . Anemia 09/14/2012  . Constipation 08/27/2012  . Essential hypertension 10/04/2011  . Hyperlipidemia, mixed 10/04/2011    Allergies  Allergen Reactions  . Galactose Anaphylaxis, Anxiety, Diarrhea, Itching and Shortness Of Breath  . Primidone Nausea Only  .  Iodinated Contrast Media Hives  . Oxycodone Nausea And Vomiting and Nausea Only    Past Surgical History:  Procedure Laterality Date  . ANKLE ARTHROSCOPY WITH OPEN REDUCTION INTERNAL FIXATION (ORIF)  07/2015   tri-malleolar  . BUNIONECTOMY Bilateral 2001, 2005  . CHOLECYSTECTOMY    . COLONOSCOPY  05/2010   normal  . COLONOSCOPY WITH PROPOFOL N/A 10/12/2021   Procedure: COLONOSCOPY WITH PROPOFOL;  Surgeon: Jaynie Collins, DO;  Location: [facility] ENDOSCOPY;  Service:  Gastroenterology;  Laterality: N/A;  . GANGLION CYST EXCISION Left   . THORACOTOMY / DECORTICATION PARIETAL PLEURA Left 06/2017   [facility]  . TOTAL KNEE ARTHROPLASTY Left 11/08/2020  . 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 Smoking status: Former    Current packs/day: 0.00    Average packs/day: 0.3 packs/day for 20.0 years (5.0 ttl pk-yrs)    Types: Cigarettes    Start date: 51    Quit date: 39    Years since quitting: 39.8  . Smokeless tobacco: Never  . Tobacco comments:    smoking cessation materials not required  Vaping Use  . Vaping status: Never Used  Substance Use Topics  . Alcohol use: Not Currently    Alcohol/week: 1.0 standard drink of alcohol    Types: 1 Glasses of wine per week    Comment: stopped drinking wine 2 years  . Drug use: No     Medication list has been reviewed and updated.  Current Meds  Medication Sig  . Ascorbic Acid (VITAMIN C WITH ROSE HIPS) 500 MG tablet Take 500 mg by mouth daily.  Marland Kitchen buPROPion (WELLBUTRIN XL) 300 MG 24 hr tablet TAKE 1 TABLET BY MOUTH EVERY DAY  . Calcium Carbonate-Vit D-Min (CALCIUM 1200 PO) Take 1,200 mg by mouth.  . Calcium-Magnesium-Zinc 333-133-5 MG TABS Take by mouth.  . CHOLECALCIFEROL PO Take 1 tablet by mouth daily. 5000 iu  . ciprofloxacin (CIPRO) 250 MG tablet Take 1 tablet (250 mg total) by mouth 2 (two) times daily for 5 days.  Lucila Maine 500 MG CAPS Take by mouth.  . EPINEPHrine 0.3 mg/0.3 mL IJ SOAJ injection INJECT 0.3 MLS (0.3 MG TOTAL) INTO THE MUSCLE AS NEEDED (ANAPHYLAXIS). FOR UP TO 1 DOSE  . gentamicin ointment (GARAMYCIN) 0.1 % 3 (three) times daily.  . hydrochlorothiazide (HYDRODIURIL) 25 MG tablet TAKE 1 TABLET (25 MG TOTAL) BY MOUTH DAILY.  . hydrocortisone (ANUSOL-HC) 2.5 % rectal cream Place 1 Application rectally 2 (two) times daily.  . Melatonin 10 MG TABS Take by mouth at bedtime as needed.  . Metoprolol Tartrate 37.5 MG TABS TAKE 1  TABLET BY MOUTH DAILY.  Marland Kitchen Misc Natural Products (TURMERIC CURCUMIN) CAPS Take 1 capsule by mouth daily. 1000 mg  . Multiple Vitamin (MULTIVITAMIN WITH MINERALS) TABS tablet Take 1 tablet by mouth daily.  . Omega-3 Fatty Acids (FISH OIL) 1000 MG CAPS Take by mouth.  . psyllium (METAMUCIL) 58.6 % powder Take 1 packet by mouth as needed.  . simvastatin (ZOCOR) 20 MG tablet TAKE 1 TABLET BY MOUTH EVERY DAY  . Turmeric 1053 MG TABS Take by mouth.       12/13/2022   10:22 AM 09/04/2022    4:13 PM 02/21/2022   11:18 AM 01/17/2022    9:54 AM  GAD 7 : Generalized Anxiety Score  Nervous, Anxious, on Edge 0 0 0 0  Control/stop worrying 0 0 0 0  Worry too much -  different things 1 0 0 0  Trouble relaxing 0 0 0 0  Restless 0 0 0 0  Easily annoyed or irritable 0 0 0 0  Afraid - awful might happen 0 0 0 0  Total GAD 7 Score 1 0 0 0  Anxiety Difficulty Not difficult at all Not difficult at all Not difficult at all Not difficult at all       12/13/2022   10:21 AM 09/04/2022    4:13 PM 02/21/2022   11:18 AM  Depression screen PHQ 2/9  Decreased Interest 0 0 0  Down, Depressed, Hopeless 0 0 0  PHQ - 2 Score 0 0 0  Altered sleeping 1 0 0  Tired, decreased energy 0 0 0  Change in appetite 0 0 0  Feeling bad or failure about yourself  0 0 0  Trouble concentrating 0 0 0  Moving slowly or fidgety/restless 0 0 0  Suicidal thoughts 0 0 0  PHQ-9 Score 1 0 0  Difficult doing work/chores Not difficult at all Not difficult at all Not difficult at all    BP Readings from Last 3 Encounters:  12/13/22 126/66  12/05/22 124/72  09/04/22 128/72    Physical Exam Constitutional:      Appearance: Normal appearance.  Cardiovascular:     Rate and Rhythm: Normal rate and regular rhythm.  Pulmonary:     Effort: No respiratory distress.     Breath sounds: No wheezing or rhonchi.  Abdominal:     General: Abdomen is flat.     Tenderness: There is abdominal tenderness in the suprapubic area. There is no  right CVA tenderness or left CVA tenderness.  Musculoskeletal:     Cervical back: Normal range of motion.  Lymphadenopathy:     Cervical: No cervical adenopathy.  Neurological:     Mental Status: She is alert.      Wt Readings from Last 3 Encounters:  12/13/22 147 lb (66.7 kg)  12/05/22 147 lb (66.7 kg)  09/04/22 147 lb (66.7 kg)    BP 126/66   Pulse 74   Temp 98.1 F (36.7 C) (Oral)   Ht 5\' 4"  (1.626 m)   Wt 147 lb (66.7 kg)   SpO2 100%   BMI 25.23 kg/m   Assessment and Plan:  Problem List Items Addressed This Visit   None Visit Diagnoses     Dysuria    -  Primary   worsening UTI sx with + UA will start Cipro, add AZO x 2 days and Cranberry tabs daily repeat Cx and advise   Relevant Medications   ciprofloxacin (CIPRO) 250 MG tablet   Other Relevant Orders   POCT Urinalysis Dipstick (Completed)   Urine Culture       No follow-ups on file.    Reubin Milan, MD [facility] [place]

## 2022-12-13 NOTE — Patient Instructions (Signed)
Take AZO  2-3 times per day as on the instructions for 2 days  Take Cranberry tablets - take daily

## 2022-12-18 ENCOUNTER — Other Ambulatory Visit: Payer: Self-pay | Admitting: Internal Medicine

## 2022-12-18 DIAGNOSIS — N3000 Acute cystitis without hematuria: Secondary | ICD-10-CM

## 2022-12-18 LAB — URINE CULTURE

## 2022-12-18 MED ORDER — SULFAMETHOXAZOLE-TRIMETHOPRIM 800-160 MG PO TABS
1.0000 | ORAL_TABLET | Freq: Two times a day (BID) | ORAL | 0 refills | Status: AC
Start: 2022-12-18 — End: 2022-12-25

## 2023-01-09 ENCOUNTER — Encounter: Payer: Self-pay | Admitting: Emergency Medicine

## 2023-01-09 DIAGNOSIS — C7A090 Malignant carcinoid tumor of the bronchus and lung: Secondary | ICD-10-CM | POA: Diagnosis not present

## 2023-01-09 DIAGNOSIS — R160 Hepatomegaly, not elsewhere classified: Secondary | ICD-10-CM | POA: Diagnosis not present

## 2023-01-09 DIAGNOSIS — C782 Secondary malignant neoplasm of pleura: Secondary | ICD-10-CM | POA: Diagnosis not present

## 2023-01-10 ENCOUNTER — Ambulatory Visit: Payer: Medicare HMO | Admitting: Emergency Medicine

## 2023-01-10 VITALS — Ht 64.0 in | Wt 145.0 lb

## 2023-01-10 DIAGNOSIS — Z1231 Encounter for screening mammogram for malignant neoplasm of breast: Secondary | ICD-10-CM

## 2023-01-10 DIAGNOSIS — Z780 Asymptomatic menopausal state: Secondary | ICD-10-CM

## 2023-01-10 DIAGNOSIS — Z0000 Encounter for general adult medical examination without abnormal findings: Secondary | ICD-10-CM

## 2023-01-10 NOTE — Progress Notes (Signed)
Subjective:   Alexandra Watson is a 77 y.o. female who presents for Medicare Annual (Subsequent) preventive examination.  Visit Complete: Virtual I connected with  Sharyn Blitz on 01/10/23 by a audio enabled telemedicine application and verified that I am speaking with the correct person using two identifiers.  Patient Location: Home  Provider Location: Home Office  I discussed the limitations of evaluation and management by telemedicine. The patient expressed understanding and agreed to proceed.  Vital Signs: Because this visit was a virtual/telehealth visit, some criteria may be missing or patient reported. Any vitals not documented were not able to be obtained and vitals that have been documented are patient reported.   Cardiac Risk Factors include: advanced age (>77men, >70 women);dyslipidemia;hypertension     Objective:    Today's Vitals   01/10/23 1417  Weight: 145 lb (65.8 kg)  Height: 5\' 4"  (1.626 m)   Body mass index is 24.89 kg/m.     01/10/2023    2:31 PM 02/17/2022   10:32 AM 11/22/2021    9:22 AM 10/12/2021    9:57 AM 07/31/2021    7:02 AM 11/12/2020    3:16 PM 07/15/2019   11:03 AM  Advanced Directives  Does Patient Have a Medical Advance Directive? Yes No Yes Yes No Yes Yes  Type of Estate agent of [place];Living will  Living will   Healthcare Power of [place];Living will Healthcare Power of [place];Living will  Does patient want to make changes to medical advance directive? Yes (MAU/Ambulatory/Procedural Areas - Information given)     No - Patient declined Yes (MAU/Ambulatory/Procedural Areas - Information given)  Copy of Healthcare Power of Attorney in Chart? No - copy requested     No - copy requested   Would patient like information on creating a medical advance directive?     No - Patient declined      Current Medications (verified) Outpatient Encounter Medications as of 01/10/2023  Medication Sig   Ascorbic Acid (VITAMIN  C WITH ROSE HIPS) 500 MG tablet Take 500 mg by mouth daily.   buPROPion (WELLBUTRIN XL) 300 MG 24 hr tablet TAKE 1 TABLET BY MOUTH EVERY DAY   Calcium Carbonate-Vit D-Min (CALCIUM 1200 PO) Take 1,200 mg by mouth.   CHOLECALCIFEROL PO Take 1 tablet by mouth daily. 5000 iu   Elderberry 500 MG CAPS Take by mouth.   EPINEPHrine 0.3 mg/0.3 mL IJ SOAJ injection INJECT 0.3 MLS (0.3 MG TOTAL) INTO THE MUSCLE AS NEEDED (ANAPHYLAXIS). FOR UP TO 1 DOSE   gentamicin ointment (GARAMYCIN) 0.1 % 3 (three) times daily.   hydrochlorothiazide (HYDRODIURIL) 25 MG tablet TAKE 1 TABLET (25 MG TOTAL) BY MOUTH DAILY.   hydrocortisone (ANUSOL-HC) 2.5 % rectal cream Place 1 Application rectally 2 (two) times daily.   Melatonin 10 MG TABS Take by mouth at bedtime as needed.   Metoprolol Tartrate 37.5 MG TABS TAKE 1 TABLET BY MOUTH DAILY.   Multiple Vitamin (MULTIVITAMIN WITH MINERALS) TABS tablet Take 1 tablet by mouth daily.   psyllium (METAMUCIL) 58.6 % powder Take 1 packet by mouth as needed.   simvastatin (ZOCOR) 20 MG tablet TAKE 1 TABLET BY MOUTH EVERY DAY   Calcium-Magnesium-Zinc 333-133-5 MG TABS Take by mouth. (Patient not taking: Reported on 01/10/2023)   Misc Natural Products (TURMERIC CURCUMIN) CAPS Take 1 capsule by mouth daily. 1000 mg (Patient not taking: Reported on 01/10/2023)   Omega-3 Fatty Acids (FISH OIL) 1000 MG CAPS Take by mouth. (Patient not taking: Reported  on 01/10/2023)   Turmeric 1053 MG TABS Take by mouth. (Patient not taking: Reported on 01/10/2023)   No facility-administered encounter medications on file as of 01/10/2023.    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   Past Surgical History:  Procedure Laterality Date   ANKLE ARTHROSCOPY WITH OPEN REDUCTION INTERNAL FIXATION (ORIF)  07/2015   tri-malleolar   BUNIONECTOMY Bilateral 2001, 2005   CHOLECYSTECTOMY     COLONOSCOPY  05/2010   normal   COLONOSCOPY WITH PROPOFOL N/A 10/12/2021   Procedure: COLONOSCOPY WITH PROPOFOL;  Surgeon: Jaynie Collins, DO;  Location: [facility] ENDOSCOPY;  Service: Gastroenterology;  Laterality: N/A;   GANGLION CYST EXCISION Left    THORACOTOMY / DECORTICATION PARIETAL PLEURA Left 06/2017   [facility]   TOTAL KNEE ARTHROPLASTY Left 11/08/2020   VATS Sleeve lobectomy Left 09/2011   typical variant carcinoid   Family History  Problem Relation Age of Onset   Stroke Mother    Heart disease Mother    Heart attack Father    Heart disease Father    CAD Brother    Heart disease Brother    Heart attack Brother    Heart disease Brother    Breast cancer Neg Hx    Social History   Socioeconomic History   Marital status: Married    Spouse name: Leta Jungling "Dwight"   Number of children: 1   Years of education: some college   Highest education level: 12th grade  Occupational History   Occupation: Retired  Tobacco Use   Smoking status: Former    Current packs/day: 0.00    Average packs/day: 0.3 packs/day for 20.0 years (5.0 ttl pk-yrs)    Types: Cigarettes    Start date: 2    Quit date: 1985    Years since quitting: 39.8   Smokeless tobacco: Never   Tobacco comments:    smoking cessation materials not required  Vaping Use   Vaping status: Never Used  Substance and Sexual Activity   Alcohol use: Not Currently    Alcohol/week: 1.0 standard drink of alcohol    Types: 1 Glasses of wine per week    Comment: stopped drinking wine 2 years   Drug use: No   Sexual activity: Not Currently  Other Topics Concern   Not on file  Social History Narrative   1 child, 2 step-children   Social Determinants of Health   Financial Resource  Strain: Low Risk  (01/10/2023)   Overall Financial Resource Strain (CARDIA)    Difficulty of Paying Living Expenses: Not hard at all  Food Insecurity: No Food Insecurity (01/10/2023)   Hunger Vital Sign    Worried About Running Out of Food in the Last Year: Never true    Ran Out of Food in the Last Year: Never true  Transportation Needs: No Transportation Needs (01/10/2023)   PRAPARE - Administrator, Civil Service (Medical): No    Lack of Transportation (Non-Medical): No  Physical Activity: Insufficiently Active (01/10/2023)   Exercise Vital Sign    Days of Exercise per Week: 2 days    Minutes of Exercise  per Session: 20 min  Stress: No Stress Concern Present (01/10/2023)   [employer] of Occupational Health - Occupational Stress Questionnaire    Feeling of Stress : Not at all  Social Connections: Socially Integrated (01/10/2023)   Social Connection and Isolation Panel [NHANES]    Frequency of Communication with Friends and Family: More than three times a week    Frequency of Social Gatherings with Friends and Family: Twice a week    Attends Religious Services: More than 4 times per year    Active Member of [employer] or Organizations: Yes    Attends Engineer, structural: More than 4 times per year    Marital Status: Married    Tobacco Counseling Counseling given: Not Answered Tobacco comments: smoking cessation materials not required   Clinical Intake:  Pre-visit preparation completed: Yes  Pain : No/denies pain     BMI - recorded: 24.89 Nutritional Status: BMI of 19-24  Normal Nutritional Risks: None Diabetes: No  How often do you need to have someone help you when you read instructions, pamphlets, or other written materials from your doctor or pharmacy?: 1 - Never  Interpreter Needed?: No  Information entered by :: Tora Kindred, CMA   Activities of Daily Living    01/10/2023    2:20 PM  In your present state of health, do you have any  difficulty performing the following activities:  Hearing? 0  Vision? 0  Difficulty concentrating or making decisions? 0  Walking or climbing stairs? 0  Dressing or bathing? 0  Doing errands, shopping? 0  Preparing Food and eating ? N  Using the Toilet? N  In the past six months, have you accidently leaked urine? N  Do you have problems with loss of bowel control? N  Managing your Medications? N  Managing your Finances? N  Housekeeping or managing your Housekeeping? N    Patient Care Team: Reubin Milan, MD as PCP - General (Internal Medicine) Denyse Dago, MD as Referring Physician (Surgical Oncology) Dr. Claudie Fisherman (Ophthalmology) Lorella Nimrod, MD as Referring Physician (Allergy and Immunology) Leane Para, MD as Referring Physician (Oncology) Wynelle Beckmann as Physician Assistant (Dermatology)  Indicate any recent Medical Services you may have received from other than [facility] providers in the past year (date may be approximate).     Assessment:   This is a routine wellness examination for [place].  Hearing/Vision screen Hearing Screening - Comments:: Denies hearing loss Vision Screening - Comments:: Gets eye exams   Goals Addressed               This Visit's Progress     Patient Stated (pt-stated)        Exercise and walk more      Depression Screen    01/10/2023    2:29 PM 12/13/2022   10:21 AM 09/04/2022    4:13 PM 02/21/2022   11:18 AM 01/17/2022    9:54 AM 12/07/2021   10:20 AM 11/22/2021    9:22 AM  PHQ 2/9 Scores  PHQ - 2 Score 0 0 0 0 0 0 0  PHQ- 9 Score 0 1 0 0 0 0 0    Fall Risk    01/10/2023    2:35 PM 12/13/2022   10:21 AM 09/04/2022    4:13 PM 02/21/2022   11:19 AM 01/17/2022    9:54 AM  Fall Risk   Falls in the past year? 0 0 0 0 0  Number falls in past yr: 0  0 0 0 0  Injury with Fall? 0 0 0 0 0  Risk for fall due to : No Fall Risks No Fall Risks No Fall Risks No Fall Risks No Fall Risks  Follow up Falls prevention  discussed Falls evaluation completed Falls evaluation completed Falls evaluation completed Falls evaluation completed    MEDICARE RISK AT HOME: Medicare Risk at Home Any stairs in or around the home?: Yes If so, are there any without handrails?: No Home free of loose throw rugs in walkways, pet beds, electrical cords, etc?: Yes Adequate lighting in your home to reduce risk of falls?: Yes Life alert?: No Use of a cane, walker or w/c?: No Grab bars in the bathroom?: Yes Shower chair or bench in shower?: No Elevated toilet seat or a handicapped toilet?: Yes  TIMED UP AND GO:  Was the test performed?  No    Cognitive Function:        01/10/2023    2:36 PM 11/22/2021    9:23 AM 11/12/2020    3:11 PM 07/14/2018    1:43 PM 07/10/2017   11:08 AM  6CIT Screen  What Year? 0 points 0 points 0 points 0 points 0 points  What month? 0 points 0 points 0 points 0 points 0 points  What time? 0 points 0 points 0 points 0 points 0 points  Count back from 20 0 points 0 points 0 points 0 points 0 points  Months in reverse 0 points 0 points 0 points 0 points 0 points  Repeat phrase 0 points 2 points  0 points 0 points  Total Score 0 points 2 points  0 points 0 points    Immunizations Immunization History  Administered Date(s) Administered   Fluad Quad(high Dose 65+) 12/11/2018, 01/04/2022   Influenza, High Dose Seasonal PF 11/25/2014, 11/24/2015, 11/22/2016, 12/17/2017, 12/23/2020   Influenza-Unspecified 11/30/2019, 12/05/2022   PFIZER(Purple Top)SARS-COV-2 Vaccination 04/18/2019, 05/10/2019, 01/02/2020, 03/04/2021   Pneumococcal Conjugate-13 02/02/2013   Pneumococcal Polysaccharide-23 11/16/2010, 02/03/2014   Tdap 06/10/2009   Zoster Recombinant(Shingrix) 01/25/2018, 10/25/2018   Zoster, Live 01/25/2018    TDAP status: Due, Education has been provided regarding the importance of this vaccine. Advised may receive this vaccine at local pharmacy or Health Dept. Aware to provide a copy of the  vaccination record if obtained from local pharmacy or Health Dept. Verbalized acceptance and understanding.  Flu Vaccine status: Up to date  Pneumococcal vaccine status: Up to date  Covid-19 vaccine status: Declined, Education has been provided regarding the importance of this vaccine but patient still declined. Advised may receive this vaccine at local pharmacy or Health Dept.or vaccine clinic. Aware to provide a copy of the vaccination record if obtained from local pharmacy or Health Dept. Verbalized acceptance and understanding.  Qualifies for Shingles Vaccine? Yes   Zostavax completed No   Shingrix Completed?: Yes  Screening Tests Health Maintenance  Topic Date Due   DTaP/Tdap/Td (2 - Td or Tdap) 06/11/2019   COVID-19 Vaccine (5 - 2023-24 season) 10/28/2022   MAMMOGRAM  02/06/2023   Medicare Annual Wellness (AWV)  01/10/2024   Pneumonia Vaccine 72+ Years old  Completed   INFLUENZA VACCINE  Completed   DEXA SCAN  Completed   Zoster Vaccines- Shingrix  Completed   Hepatitis C Screening  Addressed   HPV VACCINES  Aged Out   Colonoscopy  Discontinued    Health Maintenance  Health Maintenance Due  Topic Date Due   DTaP/Tdap/Td (2 - Td or Tdap) 06/11/2019   COVID-19  Vaccine (5 - 2023-24 season) 10/28/2022    Colorectal cancer screening: No longer required.   Mammogram status: Ordered 01/10/23. Pt provided with contact info and advised to call to schedule appt.   Bone Density status: Ordered 01/10/23. Pt provided with contact info and advised to call to schedule appt.  Lung Cancer Screening: (Low Dose CT Chest recommended if Age 34-80 years, 20 pack-year currently smoking OR have quit w/in 15years.) does not qualify.   Lung Cancer Screening Referral: n/a  Additional Screening:  Hepatitis C Screening: does not qualify;   Vision Screening: Recommended annual ophthalmology exams for early detection of glaucoma and other disorders of the eye.  Dental Screening:  Recommended annual dental exams for proper oral hygiene   Community Resource Referral / Chronic Care Management: CRR required this visit?  No   CCM required this visit?  No     Plan:     I have personally reviewed and noted the following in the patient's chart:   Medical and social history Use of alcohol, tobacco or illicit drugs  Current medications and supplements including opioid prescriptions. Patient is not currently taking opioid prescriptions. Functional ability and status Nutritional status Physical activity Advanced directives List of other physicians Hospitalizations, surgeries, and ER visits in previous 12 months Vitals Screenings to include cognitive, depression, and falls Referrals and appointments  In addition, I have reviewed and discussed with patient certain preventive protocols, quality metrics, and best practice recommendations. A written personalized care plan for preventive services as well as general preventive health recommendations were provided to patient.     Tora Kindred, CMA   01/10/2023   After Visit Summary: (MyChart) Due to this being a telephonic visit, the after visit summary with patients personalized plan was offered to patient via MyChart   Nurse Notes:  Placed order for MMG and DEXA scan Needs Tdap Declined Covid booster Plans to get the RSV vaccine

## 2023-01-10 NOTE — Patient Instructions (Addendum)
Alexandra Watson , Thank you for taking time to come for your Medicare Wellness Visit. I appreciate your ongoing commitment to your health goals. Please review the following plan we discussed and let me know if I can assist you in the future.   Referrals/Orders/Follow-Ups/Clinician Recommendations:  Get a tetanus shot at your earliest convenience. Get the RSV vaccine at your earliest convenience. I have placed orders for a mammogram and bone density test. Call [phone] to schedule these. Your mammogram will be due after 02/06/23.  This is a list of the screening recommended for you and due dates:  Health Maintenance  Topic Date Due   DTaP/Tdap/Td vaccine (2 - Td or Tdap) 06/11/2019   COVID-19 Vaccine (5 - 2023-24 season) 10/28/2022   Mammogram  02/06/2023   Medicare Annual Wellness Visit  01/10/2024   Pneumonia Vaccine  Completed   Flu Shot  Completed   DEXA scan (bone density measurement)  Completed   Zoster (Shingles) Vaccine  Completed   Hepatitis C Screening  Addressed   HPV Vaccine  Aged Out   Colon Cancer Screening  Discontinued    Advanced directives: (ACP Link)Information on Advanced Care Planning can be found at [facility] Advance Health Care Directives Advance Health Care Directives ([email or web address]   Once you have completed the forms, please bring a copy of your health care power of attorney and living will to the office to be added to your chart at your convenience.   Next Medicare Annual Wellness Visit scheduled for next year: Yes, 02/06/24 @ 1:10pm

## 2023-01-18 ENCOUNTER — Other Ambulatory Visit: Payer: Self-pay | Admitting: Internal Medicine

## 2023-01-18 DIAGNOSIS — E782 Mixed hyperlipidemia: Secondary | ICD-10-CM

## 2023-01-18 NOTE — Telephone Encounter (Signed)
Appointment 01/21/23 Requested Prescriptions  Pending Prescriptions Disp Refills   simvastatin (ZOCOR) 20 MG tablet [Pharmacy Med Name: SIMVASTATIN 20 MG TABLET] 100 tablet 0    Sig: TAKE 1 TABLET BY MOUTH EVERY DAY     Cardiovascular:  Antilipid - Statins Failed - 01/18/2023  1:36 AM      Failed - Lipid Panel in normal range within the last 12 months    Cholesterol, Total  Date Value Ref Range Status  01/17/2022 183 100 - 199 mg/dL Final   LDL Chol Calc (NIH)  Date Value Ref Range Status  01/17/2022 101 (H) 0 - 99 mg/dL Final   HDL  Date Value Ref Range Status  01/17/2022 66 >39 mg/dL Final   Triglycerides  Date Value Ref Range Status  01/17/2022 86 0 - 149 mg/dL Final         Passed - Patient is not pregnant      Passed - Valid encounter within last 12 months    Recent Outpatient Visits           1 month ago Dysuria   [facility] at [facility], Nyoka Cowden, MD   1 month ago Dysuria   [facility] at [facility], Nyoka Cowden, MD   4 months ago Cystitis   [facility] at [facility], Nyoka Cowden, MD   10 months ago Cystitis   [facility] at [facility], Ocie Bob, MD   11 months ago UTI symptoms   [facility] at [facility], Nyoka Cowden, MD       Future Appointments             In 3 days Judithann Graves, Nyoka Cowden, MD [facility] at [facility], [facility]

## 2023-01-21 ENCOUNTER — Encounter: Payer: Self-pay | Admitting: Internal Medicine

## 2023-01-21 ENCOUNTER — Ambulatory Visit (INDEPENDENT_AMBULATORY_CARE_PROVIDER_SITE_OTHER): Payer: Medicare HMO | Admitting: Internal Medicine

## 2023-01-21 VITALS — BP 118/60 | HR 79 | Ht 64.0 in | Wt 146.0 lb

## 2023-01-21 DIAGNOSIS — Z91018 Allergy to other foods: Secondary | ICD-10-CM

## 2023-01-21 DIAGNOSIS — I10 Essential (primary) hypertension: Secondary | ICD-10-CM

## 2023-01-21 DIAGNOSIS — E782 Mixed hyperlipidemia: Secondary | ICD-10-CM | POA: Diagnosis not present

## 2023-01-21 DIAGNOSIS — C7A090 Malignant carcinoid tumor of the bronchus and lung: Secondary | ICD-10-CM | POA: Diagnosis not present

## 2023-01-21 DIAGNOSIS — Z0000 Encounter for general adult medical examination without abnormal findings: Secondary | ICD-10-CM

## 2023-01-21 DIAGNOSIS — Z1231 Encounter for screening mammogram for malignant neoplasm of breast: Secondary | ICD-10-CM

## 2023-01-21 DIAGNOSIS — F411 Generalized anxiety disorder: Secondary | ICD-10-CM

## 2023-01-21 DIAGNOSIS — R7303 Prediabetes: Secondary | ICD-10-CM | POA: Diagnosis not present

## 2023-01-21 MED ORDER — BUPROPION HCL ER (XL) 300 MG PO TB24: 300.0000 mg | ORAL_TABLET | Freq: Every day | ORAL | 1 refills | Status: DC

## 2023-01-21 MED ORDER — HYDROCHLOROTHIAZIDE 25 MG PO TABS: 25.0000 mg | ORAL_TABLET | Freq: Every day | ORAL | 1 refills | Status: DC

## 2023-01-21 NOTE — Patient Instructions (Signed)
Call [facility] to schedule your mammogram and Bone Density at [phone].

## 2023-01-21 NOTE — Assessment & Plan Note (Signed)
Followed by oncology PET scan ordered for slowly growing liver lesion

## 2023-01-21 NOTE — Assessment & Plan Note (Signed)
Clinically stable on Bupropion with good response, No SI or HI reported. No change in management at this time.

## 2023-01-21 NOTE — Assessment & Plan Note (Signed)
Controlled BP with normal exam. Current regimen is hydrochlorothiazide and metoprolol. Will continue same medications; encourage continued reduced sodium diet.

## 2023-01-21 NOTE — Progress Notes (Signed)
Date:  01/21/2023   Name:  Alexandra Watson   DOB:  [birth year]   MRN:  [id number]   Chief Complaint: Annual Exam Alexandra Watson is a 77 y.o. female who presents today for her Complete Annual Exam. She feels well. She reports exercising walking 2 times a week. She reports she is sleeping well. Breast complaints none.  Mammogram: 01/2022 - ordered DEXA: 05/2017 - ordered Colonoscopy: 09/2021 aged out  Health Maintenance Due  Topic Date Due   DTaP/Tdap/Td (2 - Td or Tdap) 06/11/2019    Immunization History  Administered Date(s) Administered   Fluad Quad(high Dose 65+) 12/11/2018, 01/04/2022   Influenza, High Dose Seasonal PF 11/25/2014, 11/24/2015, 11/22/2016, 12/17/2017, 12/23/2020   Influenza-Unspecified 11/30/2019, 12/05/2022   PFIZER(Purple Top)SARS-COV-2 Vaccination 04/18/2019, 05/10/2019, 01/02/2020, 03/04/2021   Pneumococcal Conjugate-13 02/02/2013   Pneumococcal Polysaccharide-23 11/16/2010, 02/03/2014   Tdap 06/10/2009   Zoster Recombinant(Shingrix) 01/25/2018, 10/25/2018   Zoster, Live 01/25/2018     Hypertension This is a chronic problem. The problem is controlled. Associated symptoms include anxiety. Pertinent negatives include no chest pain, headaches, palpitations or shortness of breath.  Hyperlipidemia This is a chronic problem. The problem is controlled. Pertinent negatives include no chest pain or shortness of breath. Current antihyperlipidemic treatment includes statins. The current treatment provides significant improvement of lipids.  Anxiety Presents for follow-up visit. Patient reports no chest pain, dizziness, nervous/anxious behavior, palpitations or shortness of breath.    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   Review of Systems  Constitutional:  Negative for chills, fatigue and fever.  HENT:  Negative for congestion, hearing  loss, tinnitus, trouble swallowing and voice change.   Eyes:  Negative for visual disturbance.  Respiratory:  Negative for cough, chest tightness, shortness of breath and wheezing.   Cardiovascular:  Negative for chest pain, palpitations and leg swelling.  Gastrointestinal:  Negative for abdominal pain, constipation, diarrhea and vomiting.  Endocrine: Negative for polydipsia and polyuria.  Genitourinary:  Negative for dysuria, frequency, genital sores, vaginal bleeding and vaginal discharge.  Musculoskeletal:  Negative for arthralgias, gait problem and joint swelling.  Skin:  Negative for color change and rash.  Neurological:  Negative for dizziness, tremors, light-headedness and headaches.  Hematological:  Negative for adenopathy. Does not bruise/bleed easily.  Psychiatric/Behavioral:  Negative for dysphoric mood and sleep disturbance. The patient is not nervous/anxious.      Lab Results  Component Value Date   NA 138 09/04/2022   K 4.0 09/04/2022   CO2 24 09/04/2022   GLUCOSE 91 09/04/2022   BUN 19 09/04/2022   CREATININE 0.94 09/04/2022   CALCIUM 9.2 09/04/2022   EGFR 62 09/04/2022   GFRNONAA 80 01/12/2020   Lab Results  Component Value Date   CHOL 183 01/17/2022   HDL 66 01/17/2022   LDLCALC 101 (H) 01/17/2022   TRIG 86 01/17/2022   CHOLHDL 2.8 01/17/2022   Lab Results  Component Value Date   TSH 2.250 01/17/2022   Lab Results  Component Value Date   HGBA1C 6.2 (H) 01/17/2022   Lab Results  Component Value Date   WBC 5.3 09/04/2022   HGB 10.3 (L) 09/04/2022   HCT 31.4 (L) 09/04/2022   MCV 90 09/04/2022   PLT 254 09/04/2022   Lab Results  Component Value Date   ALT 15 01/17/2022   AST 18 01/17/2022   ALKPHOS  73 01/17/2022   BILITOT 0.4 01/17/2022   No results found for: "25OHVITD2", "25OHVITD3", "VD25OH"   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   Hemorrhoids  09/04/2022   Low back pain 08/28/2022   Benign essential tremor 01/10/2021   H/O total knee replacement, left 11/08/2020   Allergy to alpha-gal 06/17/2019   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   Primary osteoarthritis of left knee 09/17/2018   Generalized anxiety disorder 11/02/2017   Weight loss, abnormal 10/11/2017   Neuropathy 07/06/2016   SUI (stress urinary incontinence, female) 11/11/2015   Incomplete emptying of bladder 11/11/2015   Recurrent UTI 11/10/2015   Arthritis of knee, left 07/01/2015   Insomnia 06/30/2015   Atrophic vaginitis 06/30/2015   Gastroesophageal reflux disease 06/30/2015   Osteopenia 05/20/2015   Anemia 09/14/2012   Constipation 08/27/2012   Essential hypertension 10/04/2011   Hyperlipidemia, mixed 10/04/2011    Allergies  Allergen Reactions   Galactose Anaphylaxis, Anxiety, Diarrhea, Itching and Shortness Of Breath   Primidone Nausea Only   Iodinated Contrast Media Hives   Oxycodone Nausea And Vomiting and Nausea Only    Past Surgical History:  Procedure Laterality Date   ANKLE ARTHROSCOPY WITH OPEN REDUCTION INTERNAL FIXATION (ORIF)  07/2015   tri-malleolar   BUNIONECTOMY Bilateral 2001, 2005   CHOLECYSTECTOMY     COLONOSCOPY  05/2010   normal   COLONOSCOPY WITH PROPOFOL N/A 10/12/2021   Procedure: COLONOSCOPY WITH PROPOFOL;  Surgeon: Jaynie Collins, DO;  Location: [facility] ENDOSCOPY;  Service: Gastroenterology;  Laterality: N/A;   GANGLION CYST EXCISION Left    THORACOTOMY / DECORTICATION PARIETAL PLEURA Left 06/2017   [facility]   TOTAL KNEE ARTHROPLASTY Left 11/08/2020   VATS Sleeve lobectomy Left 09/2011   typical variant carcinoid    Social History   Tobacco Use   Smoking status: Former    Current packs/day: 0.00    Average packs/day: 0.3 packs/day for 20.0 years (5.0 ttl pk-yrs)    Types: Cigarettes    Start date: 41    Quit date: 82    Years since quitting: 39.9   Smokeless tobacco: Never   Tobacco comments:     smoking cessation materials not required  Vaping Use   Vaping status: Never Used  Substance Use Topics   Alcohol use: Not Currently    Alcohol/week: 1.0 standard drink of alcohol    Types: 1 Glasses of wine per week    Comment: stopped drinking wine 2 years   Drug use: No     Medication list has been reviewed and updated.  Current Meds  Medication Sig   Ascorbic Acid (VITAMIN C WITH ROSE HIPS) 500 MG tablet Take 500 mg by mouth daily.   Calcium Carbonate-Vit D-Min (CALCIUM 1200 PO) Take 1,200 mg by mouth.   CHOLECALCIFEROL PO Take 1 tablet by mouth daily. 5000 iu   Elderberry 500 MG CAPS Take by mouth.   EPINEPHrine 0.3 mg/0.3 mL IJ SOAJ injection INJECT 0.3 MLS (0.3 MG TOTAL) INTO THE MUSCLE AS NEEDED (ANAPHYLAXIS). FOR UP TO 1 DOSE   gentamicin ointment (GARAMYCIN) 0.1 % 3 (three) times daily.   hydrocortisone (ANUSOL-HC) 2.5 % rectal cream Place 1 Application rectally 2 (two) times daily.   Melatonin 10 MG TABS Take by mouth at bedtime as needed.   Metoprolol Tartrate 37.5 MG TABS TAKE 1 TABLET BY MOUTH DAILY.   Multiple Vitamin (MULTIVITAMIN WITH MINERALS) TABS  tablet Take 1 tablet by mouth daily.   psyllium (METAMUCIL) 58.6 % powder Take 1 packet by mouth as needed.   simvastatin (ZOCOR) 20 MG tablet TAKE 1 TABLET BY MOUTH EVERY DAY   [DISCONTINUED] buPROPion (WELLBUTRIN XL) 300 MG 24 hr tablet TAKE 1 TABLET BY MOUTH EVERY DAY   [DISCONTINUED] hydrochlorothiazide (HYDRODIURIL) 25 MG tablet TAKE 1 TABLET (25 MG TOTAL) BY MOUTH DAILY.       01/21/2023   10:00 AM 12/13/2022   10:22 AM 09/04/2022    4:13 PM 02/21/2022   11:18 AM  GAD 7 : Generalized Anxiety Score  Nervous, Anxious, on Edge 0 0 0 0  Control/stop worrying 0 0 0 0  Worry too much - different things 0 1 0 0  Trouble relaxing 0 0 0 0  Restless 0 0 0 0  Easily annoyed or irritable 0 0 0 0  Afraid - awful might happen 0 0 0 0  Total GAD 7 Score 0 1 0 0  Anxiety Difficulty Not difficult at all Not difficult  at all Not difficult at all Not difficult at all       01/21/2023   10:00 AM 01/10/2023    2:29 PM 12/13/2022   10:21 AM  Depression screen PHQ 2/9  Decreased Interest 0 0 0  Down, Depressed, Hopeless 0 0 0  PHQ - 2 Score 0 0 0  Altered sleeping 0 0 1  Tired, decreased energy 0 0 0  Change in appetite 0 0 0  Feeling bad or failure about yourself  0 0 0  Trouble concentrating 0 0 0  Moving slowly or fidgety/restless 0 0 0  Suicidal thoughts 0 0 0  PHQ-9 Score 0 0 1  Difficult doing work/chores Not difficult at all Not difficult at all Not difficult at all    BP Readings from Last 3 Encounters:  01/21/23 118/60  12/13/22 126/66  12/05/22 124/72    Physical Exam Vitals and nursing note reviewed.  Constitutional:      General: She is not in acute distress.    Appearance: She is well-developed.  HENT:     Head: Normocephalic and atraumatic.     Right Ear: Tympanic membrane and ear canal normal.     Left Ear: Tympanic membrane and ear canal normal.     Nose:     Right Sinus: No maxillary sinus tenderness.     Left Sinus: No maxillary sinus tenderness.  Eyes:     General: No scleral icterus.       Right eye: No discharge.        Left eye: No discharge.     Conjunctiva/sclera: Conjunctivae normal.  Neck:     Thyroid: No thyromegaly.     Vascular: No carotid bruit.  Cardiovascular:     Rate and Rhythm: Normal rate and regular rhythm.     Pulses: Normal pulses.     Heart sounds: Normal heart sounds.  Pulmonary:     Effort: Pulmonary effort is normal. No respiratory distress.     Breath sounds: No wheezing.  Abdominal:     General: Bowel sounds are normal.     Palpations: Abdomen is soft.     Tenderness: There is no abdominal tenderness.  Musculoskeletal:     Cervical back: Normal range of motion. No erythema.     Right lower leg: No edema.     Left lower leg: No edema.  Lymphadenopathy:     Cervical: No cervical adenopathy.  Skin:    General: Skin is warm and  dry.     Findings: No rash.  Neurological:     Mental Status: She is alert and oriented to person, place, and time.     Cranial Nerves: No cranial nerve deficit.     Sensory: No sensory deficit.     Deep Tendon Reflexes: Reflexes are normal and symmetric.  Psychiatric:        Attention and Perception: Attention normal.        Mood and Affect: Mood normal.     Wt Readings from Last 3 Encounters:  01/21/23 146 lb (66.2 kg)  01/10/23 145 lb (65.8 kg)  12/13/22 147 lb (66.7 kg)    BP 118/60   Pulse 79   Ht 5\' 4"  (1.626 m)   Wt 146 lb (66.2 kg)   SpO2 99%   BMI 25.06 kg/m   Assessment and Plan:  Problem List Items Addressed This Visit       Unprioritized   Allergy to alpha-gal (Chronic)    She is doing well with dietary avoidance. No recent concerns.      Relevant Orders   Alpha-Gal Panel   Atypical carcinoid lung tumor (HCC) (Chronic)    Followed by oncology PET scan ordered for slowly growing liver lesion      Essential hypertension (Chronic)    Controlled BP with normal exam. Current regimen is hydrochlorothiazide and metoprolol. Will continue same medications; encourage continued reduced sodium diet.       Relevant Medications   hydrochlorothiazide (HYDRODIURIL) 25 MG tablet   Other Relevant Orders   CBC with Differential/Platelet   Comprehensive metabolic panel   TSH   Urinalysis, Routine w reflex microscopic   Generalized anxiety disorder (Chronic)    Clinically stable on Bupropion with good response, No SI or HI reported. No change in management at this time.       Relevant Medications   buPROPion (WELLBUTRIN XL) 300 MG 24 hr tablet   Hyperlipidemia, mixed (Chronic)    LDL is  Lab Results  Component Value Date   LDLCALC 101 (H) 01/17/2022    Current regimen is simvastatin.  Tolerating medications well without issues.       Relevant Medications   hydrochlorothiazide (HYDRODIURIL) 25 MG tablet   Other Relevant Orders   Lipid panel    Other Visit Diagnoses     Annual physical exam    -  Primary   Encounter for screening mammogram for breast cancer       to schedule at [facility]   Prediabetes       Relevant Orders   Hemoglobin A1c       Return in about 6 months (around 07/21/2023) for HTN.    Reubin Milan, MD [facility] [place]

## 2023-01-21 NOTE — Assessment & Plan Note (Signed)
She is doing well with dietary avoidance. No recent concerns.

## 2023-01-21 NOTE — Assessment & Plan Note (Signed)
LDL is  Lab Results  Component Value Date   LDLCALC 101 (H) 01/17/2022    Current regimen is simvastatin.  Tolerating medications well without issues.

## 2023-01-24 LAB — CBC WITH DIFFERENTIAL/PLATELET
Basophils Absolute: 0 10*3/uL (ref 0.0–0.2)
Basos: 1 %
EOS (ABSOLUTE): 0.1 10*3/uL (ref 0.0–0.4)
Eos: 2 %
Hematocrit: 34.5 % (ref 34.0–46.6)
Hemoglobin: 11.4 g/dL (ref 11.1–15.9)
Immature Grans (Abs): 0 10*3/uL (ref 0.0–0.1)
Immature Granulocytes: 0 %
Lymphocytes Absolute: 0.8 10*3/uL (ref 0.7–3.1)
Lymphs: 22 %
MCH: 29.7 pg (ref 26.6–33.0)
MCHC: 33 g/dL (ref 31.5–35.7)
MCV: 90 fL (ref 79–97)
Monocytes Absolute: 0.5 10*3/uL (ref 0.1–0.9)
Monocytes: 12 %
Neutrophils Absolute: 2.3 10*3/uL (ref 1.4–7.0)
Neutrophils: 63 %
Platelets: 276 10*3/uL (ref 150–450)
RBC: 3.84 x10E6/uL (ref 3.77–5.28)
RDW: 12.9 % (ref 11.7–15.4)
WBC: 3.7 10*3/uL (ref 3.4–10.8)

## 2023-01-24 LAB — URINALYSIS, ROUTINE W REFLEX MICROSCOPIC
Bilirubin, UA: NEGATIVE
Glucose, UA: NEGATIVE
Ketones, UA: NEGATIVE
Nitrite, UA: NEGATIVE
Protein,UA: NEGATIVE
RBC, UA: NEGATIVE
Specific Gravity, UA: 1.009 (ref 1.005–1.030)
Urobilinogen, Ur: 0.2 mg/dL (ref 0.2–1.0)
pH, UA: 7 (ref 5.0–7.5)

## 2023-01-24 LAB — LIPID PANEL
Chol/HDL Ratio: 3.5 {ratio} (ref 0.0–4.4)
Cholesterol, Total: 180 mg/dL (ref 100–199)
HDL: 51 mg/dL (ref >39)
LDL Chol Calc (NIH): 106 mg/dL — ABNORMAL HIGH (ref 0–99)
Triglycerides: 128 mg/dL (ref 0–149)
VLDL Cholesterol Cal: 23 mg/dL (ref 5–40)

## 2023-01-24 LAB — TSH: TSH: 1.74 u[IU]/mL (ref 0.450–4.500)

## 2023-01-24 LAB — COMPREHENSIVE METABOLIC PANEL
ALT: 15 [IU]/L (ref 0–32)
AST: 18 [IU]/L (ref 0–40)
Albumin: 4.7 g/dL (ref 3.8–4.8)
Alkaline Phosphatase: 102 [IU]/L (ref 44–121)
BUN/Creatinine Ratio: 14 (ref 12–28)
BUN: 14 mg/dL (ref 8–27)
Bilirubin Total: 0.3 mg/dL (ref 0.0–1.2)
CO2: 25 mmol/L (ref 20–29)
Calcium: 10 mg/dL (ref 8.7–10.3)
Chloride: 100 mmol/L (ref 96–106)
Creatinine, Ser: 1.03 mg/dL — ABNORMAL HIGH (ref 0.57–1.00)
Globulin, Total: 1.9 g/dL (ref 1.5–4.5)
Glucose: 98 mg/dL (ref 70–99)
Potassium: 4.2 mmol/L (ref 3.5–5.2)
Sodium: 139 mmol/L (ref 134–144)
Total Protein: 6.6 g/dL (ref 6.0–8.5)
eGFR: 56 mL/min/{1.73_m2} — ABNORMAL LOW (ref >59)

## 2023-01-24 LAB — MICROSCOPIC EXAMINATION
Bacteria, UA: NONE SEEN
Casts: NONE SEEN /[LPF]
Epithelial Cells (non renal): NONE SEEN /[HPF] (ref 0–10)
RBC, Urine: NONE SEEN /[HPF] (ref 0–2)
WBC, UA: NONE SEEN /[HPF] (ref 0–5)

## 2023-01-24 LAB — ALPHA-GAL PANEL
Allergen Lamb IgE: <0.1 kU/L
Beef IgE: 0.14 kU/L — AB
IgE (Immunoglobulin E), Serum: 9 [IU]/mL (ref 6–495)
O215-IgE Alpha-Gal: 0.19 kU/L — AB
Pork IgE: <0.1 kU/L

## 2023-01-24 LAB — HEMOGLOBIN A1C
Est. average glucose Bld gHb Est-mCnc: 131 mg/dL
Hgb A1c MFr Bld: 6.2 % — ABNORMAL HIGH (ref 4.8–5.6)

## 2023-02-06 DIAGNOSIS — K769 Liver disease, unspecified: Secondary | ICD-10-CM | POA: Diagnosis not present

## 2023-02-06 DIAGNOSIS — C7A090 Malignant carcinoid tumor of the bronchus and lung: Secondary | ICD-10-CM | POA: Diagnosis not present

## 2023-02-07 DIAGNOSIS — H5203 Hypermetropia, bilateral: Secondary | ICD-10-CM | POA: Diagnosis not present

## 2023-03-01 ENCOUNTER — Encounter: Payer: Self-pay | Admitting: Physician Assistant

## 2023-03-01 ENCOUNTER — Ambulatory Visit (INDEPENDENT_AMBULATORY_CARE_PROVIDER_SITE_OTHER): Payer: Medicare HMO | Admitting: Physician Assistant

## 2023-03-01 VITALS — BP 122/50 | HR 66 | Temp 98.4°F | Ht 64.0 in | Wt 147.0 lb

## 2023-03-01 DIAGNOSIS — J069 Acute upper respiratory infection, unspecified: Secondary | ICD-10-CM | POA: Diagnosis not present

## 2023-03-01 LAB — POCT INFLUENZA A/B
Influenza A, POC: NEGATIVE
Influenza B, POC: NEGATIVE

## 2023-03-01 LAB — POC COVID19 BINAXNOW: SARS Coronavirus 2 Ag: NEGATIVE

## 2023-03-01 MED ORDER — BENZONATATE 200 MG PO CAPS: 200.0000 mg | ORAL_CAPSULE | Freq: Two times a day (BID) | ORAL | 0 refills | Status: DC | PRN

## 2023-03-01 MED ORDER — DM-GUAIFENESIN ER 30-600 MG PO TB12: 1.0000 | ORAL_TABLET | Freq: Two times a day (BID) | ORAL | Status: DC

## 2023-03-01 NOTE — Progress Notes (Signed)
 Date:  03/01/2023   Name:  Alexandra Watson   DOB:  08/08/1945   MRN:  [id number]   Chief Complaint: Cough  Cough This is a new problem. Episode onset: X2-3. The problem has been gradually worsening. The problem occurs hourly. The cough is Productive of sputum (green mucous). Associated symptoms include nasal congestion, postnasal drip, rhinorrhea and a sore throat. Nothing aggravates the symptoms. She has tried OTC cough suppressant for the symptoms.   Alexandra Watson is a 78 year old female new to me today, typically sees my colleague Dr. Leita Adie for routine care, here for evaluation of cough productive of yellow sputum and other URI symptoms for the last 3 days. Family sick including son, husband, and two grandsons with similar symptoms. Tried generic coricidin HBP tablets (acetaminophen  + chlorpheniramine) without much relief.  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    Medication list has been reviewed and updated.  Current Meds  Medication Sig   Ascorbic Acid (VITAMIN C WITH ROSE HIPS) 500 MG tablet Take 500 mg by mouth daily.   benzonatate  (TESSALON ) 200 MG capsule Take 1 capsule (200 mg total) by mouth 2 (two) times daily as needed for cough.   buPROPion  (WELLBUTRIN  XL) 300 MG 24 hr tablet Take 1 tablet (300 mg total) by mouth daily.   Calcium Carbonate-Vit D-Min (CALCIUM 1200 PO) Take 1,200 mg by mouth.   CHOLECALCIFEROL PO Take 1 tablet by mouth daily. 5000 iu   Elderberry 500 MG CAPS Take by mouth.   EPINEPHrine  0.3 mg/0.3 mL IJ SOAJ injection INJECT 0.3 MLS (0.3 MG TOTAL) INTO THE MUSCLE AS NEEDED (ANAPHYLAXIS). FOR UP TO 1 DOSE   gentamicin ointment (GARAMYCIN) 0.1 % 3 (three) times daily.   hydrochlorothiazide  (HYDRODIURIL ) 25 MG tablet Take 1 tablet (25 mg total) by mouth daily.   hydrocortisone  (ANUSOL -HC) 2.5 % rectal cream Place 1 Application rectally 2  (two) times daily.   Melatonin 10 MG TABS Take by mouth at bedtime as needed.   Metoprolol  Tartrate 37.5 MG TABS TAKE 1 TABLET BY MOUTH DAILY.   Multiple Vitamin (MULTIVITAMIN WITH MINERALS) TABS tablet Take 1 tablet by mouth daily.   psyllium (METAMUCIL) 58.6 % powder Take 1 packet by mouth as needed.   simvastatin  (ZOCOR ) 20 MG tablet TAKE 1 TABLET BY MOUTH EVERY DAY     Review of Systems  HENT:  Positive for postnasal drip, rhinorrhea and sore throat.   Respiratory:  Positive for cough.     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   Hemorrhoids 09/04/2022   Low back pain 08/28/2022   Benign essential tremor 01/10/2021   H/O total knee replacement, left 11/08/2020   Allergy to alpha-gal 06/17/2019   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   Primary osteoarthritis of left knee 09/17/2018   Generalized anxiety disorder 11/02/2017   Weight loss, abnormal 10/11/2017   Neuropathy 07/06/2016   SUI (stress urinary incontinence, female) 11/11/2015   Incomplete emptying of bladder 11/11/2015   Recurrent UTI 11/10/2015   Arthritis of knee, left 07/01/2015   Insomnia 06/30/2015   Atrophic vaginitis 06/30/2015   Gastroesophageal reflux disease 06/30/2015   Osteopenia 05/20/2015   Anemia 09/14/2012   Constipation 08/27/2012   Essential hypertension 10/04/2011   Hyperlipidemia, mixed 10/04/2011    Allergies  Allergen Reactions  Galactose Anaphylaxis, Anxiety, Diarrhea, Itching and Shortness Of Breath   Primidone  Nausea Only   Iodinated Contrast Media Hives   Oxycodone Nausea And Vomiting and Nausea Only    Immunization History  Administered Date(s) Administered   Fluad Quad(high Dose 65+) 12/11/2018, 01/04/2022   Influenza, High Dose Seasonal PF 11/25/2014, 11/24/2015, 11/22/2016, 12/17/2017, 12/23/2020   Influenza-Unspecified 11/30/2019, 12/05/2022   PFIZER(Purple  Top)SARS-COV-2 Vaccination 04/18/2019, 05/10/2019, 01/02/2020, 03/04/2021   Pneumococcal Conjugate-13 02/02/2013   Pneumococcal Polysaccharide-23 11/16/2010, 02/03/2014   Tdap 06/10/2009   Zoster Recombinant(Shingrix) 01/25/2018, 10/25/2018   Zoster, Live 01/25/2018    Past Surgical History:  Procedure Laterality Date   ANKLE ARTHROSCOPY WITH OPEN REDUCTION INTERNAL FIXATION (ORIF)  07/2015   tri-malleolar   BUNIONECTOMY Bilateral 2001, 2005   CHOLECYSTECTOMY     COLONOSCOPY  05/2010   normal   COLONOSCOPY WITH PROPOFOL  N/A 10/12/2021   Procedure: COLONOSCOPY WITH PROPOFOL ;  Surgeon: Onita Elspeth Sharper, DO;  [facility] ENDOSCOPY;  Service: Gastroenterology;  Laterality: N/A;   GANGLION CYST EXCISION Left    THORACOTOMY / DECORTICATION PARIETAL PLEURA Left 06/2017   [facility]   TOTAL KNEE ARTHROPLASTY Left 11/08/2020   VATS Sleeve lobectomy Left 09/2011   typical variant carcinoid    Social History   Tobacco Use   Smoking status: Former    Current packs/day: 0.00    Average packs/day: 0.3 packs/day for 20.0 years (5.0 ttl pk-yrs)    Types: Cigarettes    Start date: 23    Quit date: 91    Years since quitting: 40.0   Smokeless tobacco: Never   Tobacco comments:    smoking cessation materials not required  Vaping Use   Vaping status: Never Used  Substance Use Topics   Alcohol use: Not Currently    Alcohol/week: 1.0 standard drink of alcohol    Types: 1 Glasses of wine per week    Comment: stopped drinking wine 2 years   Drug use: No    Family History  Problem Relation Age of Onset   Stroke Mother    Heart disease Mother    Heart attack Father    Heart disease Father    CAD Brother    Heart disease Brother    Heart attack Brother    Heart disease Brother    Breast cancer Neg Hx         03/01/2023   11:22 AM 01/21/2023   10:00 AM 12/13/2022   10:22 AM 09/04/2022    4:13 PM  GAD 7 : Generalized Anxiety Score  Nervous, Anxious, on Edge 0 0 0 0   Control/stop worrying 0 0 0 0  Worry too much - different things 0 0 1 0  Trouble relaxing 0 0 0 0  Restless 0 0 0 0  Easily annoyed or irritable 0 0 0 0  Afraid - awful might happen 0 0 0 0  Total GAD 7 Score 0 0 1 0  Anxiety Difficulty Not difficult at all Not difficult at all Not difficult at all Not difficult at all       03/01/2023   11:16 AM 01/21/2023   10:00 AM 01/10/2023    2:29 PM  Depression screen PHQ 2/9  Decreased Interest 0 0 0  Down, Depressed, Hopeless 0 0 0  PHQ - 2 Score 0 0 0  Altered sleeping 0 0 0  Tired, decreased energy 0 0 0  Change in appetite 0 0 0  Feeling bad or failure about yourself  0  0 0  Trouble concentrating 0 0 0  Moving slowly or fidgety/restless 0 0 0  Suicidal thoughts 0 0 0  PHQ-9 Score 0 0 0  Difficult doing work/chores Not difficult at all Not difficult at all Not difficult at all    BP Readings from Last 3 Encounters:  03/01/23 (!) 122/50  01/21/23 118/60  12/13/22 126/66    Wt Readings from Last 3 Encounters:  03/01/23 147 lb (66.7 kg)  01/21/23 146 lb (66.2 kg)  01/10/23 145 lb (65.8 kg)    BP (!) 122/50 (BP Location: Right Arm, Patient Position: Sitting, Cuff Size: Normal)   Pulse 66   Temp 98.4 F (36.9 C) (Oral)   Ht 5' 4 (1.626 m)   Wt 147 lb (66.7 kg)   SpO2 99%   BMI 25.23 kg/m   Physical Exam Vitals and nursing note reviewed.  Constitutional:      General: She is not in acute distress.    Appearance: Normal appearance.  HENT:     Ears:     Comments: Dried light brown cerumen both EAC obstructing view of TM without true impaction. Visualized portion of TM on left was normal. No otalgia noted.     Nose: Nose normal.     Comments: Mild left maxillary sinus tenderness    Mouth/Throat:     Mouth: Mucous membranes are moist.     Pharynx: No oropharyngeal exudate or posterior oropharyngeal erythema.  Eyes:     Conjunctiva/sclera: Conjunctivae normal.     Pupils: Pupils are equal, round, and reactive to  light.  Cardiovascular:     Rate and Rhythm: Normal rate and regular rhythm.     Heart sounds: No murmur heard.    No friction rub. No gallop.  Pulmonary:     Effort: Pulmonary effort is normal.     Breath sounds: Normal breath sounds. No wheezing, rhonchi or rales.  Lymphadenopathy:     Cervical: No cervical adenopathy.       Recent Labs     Component Value Date/Time   NA 139 01/21/2023 1041   NA 138 09/17/2011 1515   K 4.2 01/21/2023 1041   K 3.5 09/17/2011 1515   CL 100 01/21/2023 1041   CL 99 09/17/2011 1515   CO2 25 01/21/2023 1041   CO2 31 09/17/2011 1515   GLUCOSE 98 01/21/2023 1041   GLUCOSE 94 08/07/2015 2005   GLUCOSE 81 09/17/2011 1515   BUN 14 01/21/2023 1041   BUN 14 09/17/2011 1515   CREATININE 1.03 (H) 01/21/2023 1041   CREATININE 0.87 09/17/2011 1515   CALCIUM 10.0 01/21/2023 1041   CALCIUM 9.6 09/17/2011 1515   PROT 6.6 01/21/2023 1041   PROT 7.9 09/17/2011 1515   ALBUMIN 4.7 01/21/2023 1041   ALBUMIN 4.3 09/17/2011 1515   AST 18 01/21/2023 1041   AST 21 09/17/2011 1515   ALT 15 01/21/2023 1041   ALT 32 09/17/2011 1515   ALKPHOS 102 01/21/2023 1041   ALKPHOS 85 09/17/2011 1515   BILITOT 0.3 01/21/2023 1041   BILITOT 0.4 09/17/2011 1515   GFRNONAA 80 01/12/2020 0922   GFRNONAA >60 09/17/2011 1515   GFRAA 92 01/12/2020 0922   GFRAA >60 09/17/2011 1515    Lab Results  Component Value Date   WBC 3.7 01/21/2023   HGB 11.4 01/21/2023   HCT 34.5 01/21/2023   MCV 90 01/21/2023   PLT 276 01/21/2023   Lab Results  Component Value Date   HGBA1C 6.2 (H) 01/21/2023  Lab Results  Component Value Date   CHOL 180 01/21/2023   HDL 51 01/21/2023   LDLCALC 106 (H) 01/21/2023   TRIG 128 01/21/2023   CHOLHDL 3.5 01/21/2023   Lab Results  Component Value Date   TSH 1.740 01/21/2023     Assessment and Plan:  1. Acute URI (Primary) Reassuring exam. Neg COVID/flu today. Likely viral etiology especially with multiple family members sick with  similar. Discussed self-limited nature of viral illnesses and advised conservative measures including rest, fluids, honey, and OTC cough/cold medications.   Contact precautions advised to limit spread. Encouraged mask wearing and good hand hygiene especially before meals. Call if acutely worsening symptoms or if no improvement after Day 7 of illness  - POC COVID-19 - POCT Influenza A/B - benzonatate  (TESSALON ) 200 MG capsule; Take 1 capsule (200 mg total) by mouth 2 (two) times daily as needed for cough.  Dispense: 20 capsule; Refill: 0 - dextromethorphan-guaiFENesin  (MUCINEX  DM) 30-600 MG 12hr tablet; Take 1 tablet by mouth 2 (two) times daily.  Dispense: 2 tablet     Rolan Hoyle, PA-C, DMSc, Nutritionist [facility] [phone]

## 2023-03-08 DIAGNOSIS — D2261 Melanocytic nevi of right upper limb, including shoulder: Secondary | ICD-10-CM | POA: Diagnosis not present

## 2023-03-08 DIAGNOSIS — L820 Inflamed seborrheic keratosis: Secondary | ICD-10-CM | POA: Diagnosis not present

## 2023-03-08 DIAGNOSIS — L821 Other seborrheic keratosis: Secondary | ICD-10-CM | POA: Diagnosis not present

## 2023-03-08 DIAGNOSIS — D225 Melanocytic nevi of trunk: Secondary | ICD-10-CM | POA: Diagnosis not present

## 2023-03-08 DIAGNOSIS — L538 Other specified erythematous conditions: Secondary | ICD-10-CM | POA: Diagnosis not present

## 2023-03-08 DIAGNOSIS — L2989 Other pruritus: Secondary | ICD-10-CM | POA: Diagnosis not present

## 2023-03-08 DIAGNOSIS — L814 Other melanin hyperpigmentation: Secondary | ICD-10-CM | POA: Diagnosis not present

## 2023-03-08 DIAGNOSIS — D2262 Melanocytic nevi of left upper limb, including shoulder: Secondary | ICD-10-CM | POA: Diagnosis not present

## 2023-03-20 ENCOUNTER — Ambulatory Visit
Admission: RE | Admit: 2023-03-20 | Discharge: 2023-03-20 | Disposition: A | Discharge status: Home / Self Care | Payer: Medicare HMO | Source: Ambulatory Visit | Attending: Internal Medicine | Admitting: Internal Medicine

## 2023-03-20 DIAGNOSIS — Z780 Asymptomatic menopausal state: Secondary | ICD-10-CM | POA: Insufficient documentation

## 2023-03-20 DIAGNOSIS — Z1231 Encounter for screening mammogram for malignant neoplasm of breast: Secondary | ICD-10-CM | POA: Insufficient documentation

## 2023-04-03 ENCOUNTER — Encounter: Payer: Self-pay | Admitting: Internal Medicine

## 2023-04-03 ENCOUNTER — Other Ambulatory Visit: Payer: Self-pay | Admitting: Internal Medicine

## 2023-04-03 DIAGNOSIS — I10 Essential (primary) hypertension: Secondary | ICD-10-CM

## 2023-04-05 DIAGNOSIS — C7A090 Malignant carcinoid tumor of the bronchus and lung: Secondary | ICD-10-CM | POA: Diagnosis not present

## 2023-04-16 ENCOUNTER — Other Ambulatory Visit: Payer: Self-pay | Admitting: Internal Medicine

## 2023-04-16 ENCOUNTER — Encounter: Payer: Self-pay | Admitting: Internal Medicine

## 2023-04-16 ENCOUNTER — Ambulatory Visit (INDEPENDENT_AMBULATORY_CARE_PROVIDER_SITE_OTHER): Payer: Medicare HMO | Admitting: Internal Medicine

## 2023-04-16 VITALS — BP 122/78 | HR 72 | Ht 64.0 in | Wt 138.0 lb

## 2023-04-16 DIAGNOSIS — N3000 Acute cystitis without hematuria: Secondary | ICD-10-CM | POA: Diagnosis not present

## 2023-04-16 DIAGNOSIS — N3001 Acute cystitis with hematuria: Secondary | ICD-10-CM

## 2023-04-16 DIAGNOSIS — C7A090 Malignant carcinoid tumor of the bronchus and lung: Secondary | ICD-10-CM | POA: Diagnosis not present

## 2023-04-16 LAB — POCT URINALYSIS DIPSTICK
Bilirubin, UA: NEGATIVE
Glucose, UA: NEGATIVE
Ketones, UA: NEGATIVE
Nitrite, UA: NEGATIVE
Protein, UA: NEGATIVE
Spec Grav, UA: 1.02 (ref 1.010–1.025)
Urobilinogen, UA: 0.2 U/dL
pH, UA: 6.5 (ref 5.0–8.0)

## 2023-04-16 MED ORDER — SULFAMETHOXAZOLE-TRIMETHOPRIM 800-160 MG PO TABS
1.0000 | ORAL_TABLET | Freq: Two times a day (BID) | ORAL | 0 refills | Status: AC
Start: 2023-04-16 — End: 2023-04-23

## 2023-04-16 MED ORDER — ONDANSETRON HCL 4 MG PO TABS: 4.0000 mg | ORAL_TABLET | Freq: Three times a day (TID) | ORAL | 0 refills | Status: AC | PRN

## 2023-04-16 NOTE — Progress Notes (Signed)
Date:  04/16/2023   Name:  Alexandra Watson   DOB:  Feb 03, 1946   MRN:  [id number]   Chief Complaint: Urinary Tract Infection (Strange color urine, and dysuria. Started 2 weeks ago,.)  Urinary Tract Infection  This is a new problem. The current episode started 1 to 4 weeks ago. The problem occurs every urination. The problem has been gradually worsening. The quality of the pain is described as burning. The pain is mild. There has been no fever. Associated symptoms include frequency, nausea, urgency and vomiting. Pertinent negatives include no discharge, flank pain, hematuria or sweats. She has tried increased fluids for the symptoms. The treatment provided no relief.    Review of Systems  Constitutional:  Negative for appetite change, fatigue and fever.  Respiratory:  Negative for chest tightness and shortness of breath.   Cardiovascular:  Negative for chest pain and palpitations.  Gastrointestinal:  Positive for nausea and vomiting.  Genitourinary:  Positive for frequency and urgency. Negative for flank pain and hematuria.     Lab Results  Component Value Date   NA 139 01/21/2023   K 4.2 01/21/2023   CO2 25 01/21/2023   GLUCOSE 98 01/21/2023   BUN 14 01/21/2023   CREATININE 1.03 (H) 01/21/2023   CALCIUM 10.0 01/21/2023   EGFR 56 (L) 01/21/2023   GFRNONAA 80 01/12/2020   Lab Results  Component Value Date   CHOL 180 01/21/2023   HDL 51 01/21/2023   LDLCALC 106 (H) 01/21/2023   TRIG 128 01/21/2023   CHOLHDL 3.5 01/21/2023   Lab Results  Component Value Date   TSH 1.740 01/21/2023   Lab Results  Component Value Date   HGBA1C 6.2 (H) 01/21/2023   Lab Results  Component Value Date   WBC 3.7 01/21/2023   HGB 11.4 01/21/2023   HCT 34.5 01/21/2023   MCV 90 01/21/2023   PLT 276 01/21/2023   Lab Results  Component Value Date   ALT 15 01/21/2023   AST 18 01/21/2023   ALKPHOS 102 01/21/2023   BILITOT 0.3 01/21/2023   No results found for: "25OHVITD2", "25OHVITD3",  "VD25OH"   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   Hemorrhoids 09/04/2022   Low back pain 08/28/2022   Benign essential tremor 01/10/2021   H/O total knee replacement, left 11/08/2020   Allergy to alpha-gal 06/17/2019   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   Primary osteoarthritis of left knee 09/17/2018   Generalized anxiety disorder 11/02/2017   Weight loss, abnormal 10/11/2017   Neuropathy 07/06/2016   SUI (stress urinary incontinence, female) 11/11/2015   Incomplete emptying of bladder 11/11/2015   Recurrent UTI 11/10/2015   Arthritis of knee, left 07/01/2015   Insomnia 06/30/2015   Atrophic vaginitis 06/30/2015   Gastroesophageal reflux disease 06/30/2015   Osteopenia 05/20/2015   Anemia 09/14/2012   Constipation 08/27/2012   Essential hypertension 10/04/2011   Hyperlipidemia, mixed 10/04/2011    Allergies  Allergen Reactions   Galactose Anaphylaxis, Anxiety, Diarrhea, Itching and Shortness Of Breath   Primidone Nausea Only   Iodinated Contrast Media Hives   Oxycodone Nausea And Vomiting and Nausea Only    Past Surgical History:  Procedure Laterality Date   ANKLE ARTHROSCOPY WITH OPEN REDUCTION INTERNAL FIXATION (ORIF)  07/2015   tri-malleolar   BUNIONECTOMY Bilateral 2001, 2005   CHOLECYSTECTOMY     COLONOSCOPY  05/2010   normal  COLONOSCOPY WITH PROPOFOL N/A 10/12/2021   Procedure: COLONOSCOPY WITH PROPOFOL;  Surgeon: Jaynie Collins, DO;  Location[facility] ENDOSCOPY;  Service: Gastroenterology;  Laterality: N/A;   GANGLION CYST EXCISION Left    THORACOTOMY / DECORTICATION PARIETAL PLEURA Left 06/2017   [facility]   TOTAL KNEE ARTHROPLASTY Left 11/08/2020   VATS Sleeve lobectomy Left 09/2011   typical variant carcinoid    Social History   Tobacco Use   Smoking status: Former    Current packs/day: 0.00    Average packs/day: 0.3 packs/day for 20.0  years (5.0 ttl pk-yrs)    Types: Cigarettes    Start date: 51    Quit date: 68    Years since quitting: 40.1   Smokeless tobacco: Never   Tobacco comments:    smoking cessation materials not required  Vaping Use   Vaping status: Never Used  Substance Use Topics   Alcohol use: Not Currently    Alcohol/week: 1.0 standard drink of alcohol    Types: 1 Glasses of wine per week    Comment: stopped drinking wine 2 years   Drug use: No     Medication list has been reviewed and updated.  Current Meds  Medication Sig   Ascorbic Acid (VITAMIN C WITH ROSE HIPS) 500 MG tablet Take 500 mg by mouth daily.   buPROPion (WELLBUTRIN XL) 300 MG 24 hr tablet Take 1 tablet (300 mg total) by mouth daily.   Calcium Carbonate-Vit D-Min (CALCIUM 1200 PO) Take 1,200 mg by mouth.   CHOLECALCIFEROL PO Take 1 tablet by mouth daily. 5000 iu   Elderberry 500 MG CAPS Take by mouth.   EPINEPHrine 0.3 mg/0.3 mL IJ SOAJ injection INJECT 0.3 MLS (0.3 MG TOTAL) INTO THE MUSCLE AS NEEDED (ANAPHYLAXIS). FOR UP TO 1 DOSE   gentamicin ointment (GARAMYCIN) 0.1 % 3 (three) times daily.   hydrochlorothiazide (HYDRODIURIL) 25 MG tablet Take 1 tablet (25 mg total) by mouth daily.   hydrocortisone (ANUSOL-HC) 2.5 % rectal cream Place 1 Application rectally 2 (two) times daily.   Melatonin 10 MG TABS Take by mouth at bedtime as needed.   Metoprolol Tartrate 37.5 MG TABS TAKE 1 TABLET BY MOUTH EVERY DAY   Multiple Vitamin (MULTIVITAMIN WITH MINERALS) TABS tablet Take 1 tablet by mouth daily.   ondansetron (ZOFRAN) 4 MG tablet Take 1 tablet (4 mg total) by mouth every 8 (eight) hours as needed for nausea or vomiting.   psyllium (METAMUCIL) 58.6 % powder Take 1 packet by mouth as needed.   simvastatin (ZOCOR) 20 MG tablet TAKE 1 TABLET BY MOUTH EVERY DAY   sulfamethoxazole-trimethoprim (BACTRIM DS) 800-160 MG tablet Take 1 tablet by mouth 2 (two) times daily for 7 days.       04/16/2023    1:54 PM 03/01/2023   11:22 AM  01/21/2023   10:00 AM 12/13/2022   10:22 AM  GAD 7 : Generalized Anxiety Score  Nervous, Anxious, on Edge 0 0 0 0  Control/stop worrying 0 0 0 0  Worry too much - different things 0 0 0 1  Trouble relaxing 0 0 0 0  Restless 0 0 0 0  Easily annoyed or irritable 0 0 0 0  Afraid - awful might happen 0 0 0 0  Total GAD 7 Score 0 0 0 1  Anxiety Difficulty Not difficult at all Not difficult at all Not difficult at all Not difficult at all       04/16/2023    1:54 PM 03/01/2023  11:16 AM 01/21/2023   10:00 AM  Depression screen PHQ 2/9  Decreased Interest 0 0 0  Down, Depressed, Hopeless 0 0 0  PHQ - 2 Score 0 0 0  Altered sleeping 0 0 0  Tired, decreased energy 0 0 0  Change in appetite 0 0 0  Feeling bad or failure about yourself  0 0 0  Trouble concentrating 0 0 0  Moving slowly or fidgety/restless 0 0 0  Suicidal thoughts 0 0 0  PHQ-9 Score 0 0 0  Difficult doing work/chores Not difficult at all Not difficult at all Not difficult at all    BP Readings from Last 3 Encounters:  04/16/23 122/78  03/01/23 (!) 122/50  01/21/23 118/60    Physical Exam Vitals and nursing note reviewed.  Constitutional:      Appearance: She is well-developed.  Cardiovascular:     Rate and Rhythm: Normal rate and regular rhythm.     Heart sounds: Normal heart sounds.  Pulmonary:     Effort: Pulmonary effort is normal. No respiratory distress.     Breath sounds: Normal breath sounds.  Abdominal:     General: Bowel sounds are normal.     Palpations: Abdomen is soft.     Tenderness: There is abdominal tenderness in the suprapubic area. There is no right CVA tenderness, left CVA tenderness, guarding or rebound.    Lab Results  Component Value Date   COLORU yellow 04/16/2023   CLARITYU cloudy 04/16/2023   GLUCOSEUR Negative 04/16/2023   BILIRUBINUR neg 04/16/2023   KETONESU neg 04/16/2023   SPECGRAV 1.020 04/16/2023   RBCUR small 1+ 04/16/2023   PHUR 6.5 04/16/2023   PROTEINUR Negative  04/16/2023   UROBILINOGEN 0.2 04/16/2023   LEUKOCYTESUR Large (3+) (A) 04/16/2023     Wt Readings from Last 3 Encounters:  04/16/23 138 lb (62.6 kg)  03/01/23 147 lb (66.7 kg)  01/21/23 146 lb (66.2 kg)    BP 122/78   Pulse 72   Ht 5\' 4"  (1.626 m)   Wt 138 lb (62.6 kg)   SpO2 99%   BMI 23.69 kg/m   Assessment and Plan:  Problem List Items Addressed This Visit       Unprioritized   Atypical carcinoid lung tumor (HCC) (Chronic)   Liver and lung lesions are enlarging Recently started immunotherapy with some n/v       Relevant Medications   sulfamethoxazole-trimethoprim (BACTRIM DS) 800-160 MG tablet   ondansetron (ZOFRAN) 4 MG tablet   Other Visit Diagnoses       Acute cystitis with hematuria    -  Primary   continue to push fluids will contact her when culture results are available   Relevant Medications   sulfamethoxazole-trimethoprim (BACTRIM DS) 800-160 MG tablet   Other Relevant Orders   POCT urinalysis dipstick (Completed)   Urine Culture       No follow-ups on file.    Reubin Milan, MD [facility] [place]

## 2023-04-16 NOTE — Assessment & Plan Note (Signed)
Liver and lung lesions are enlarging Recently started immunotherapy with some n/v

## 2023-04-20 LAB — URINE CULTURE

## 2023-04-21 ENCOUNTER — Other Ambulatory Visit: Payer: Self-pay | Admitting: Internal Medicine

## 2023-04-21 ENCOUNTER — Encounter: Payer: Self-pay | Admitting: Internal Medicine

## 2023-04-21 DIAGNOSIS — N3001 Acute cystitis with hematuria: Secondary | ICD-10-CM

## 2023-04-21 DIAGNOSIS — N3000 Acute cystitis without hematuria: Secondary | ICD-10-CM

## 2023-04-21 MED ORDER — NITROFURANTOIN MONOHYD MACRO 100 MG PO CAPS: 100.0000 mg | ORAL_CAPSULE | Freq: Two times a day (BID) | ORAL | 0 refills | Status: AC

## 2023-04-23 ENCOUNTER — Ambulatory Visit: Payer: Self-pay | Admitting: Internal Medicine

## 2023-04-23 NOTE — Telephone Encounter (Signed)
 Copied from CRM [id number]. Topic: Clinical - Red Word Triage >> Apr 23, 2023  1:59 PM Carlatta H wrote: [facility] that prompted transfer to Nurse Triage: Patient has been constipated for over 1 week and having stomach pains// Reason for Disposition  [1] Rectal pain or fullness from fecal impaction (rectum full of stool) AND [2] NOT better after SITZ bath, suppository or enema  Answer Assessment - Initial Assessment Questions 1. STOOL PATTERN OR FREQUENCY: "How often do you have a bowel movement (BM)?"  (Normal range: 3 times a day to every 3 days)  "When was your last BM?"       I've not had a BM for a week.   Feb. 7th I had radiation treatment at [facility] for 4 treatments.    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   I've had this treatment before 2 yrs ago.   The cancer came back.     I've tried Fleet enemas, 3 Dulolox and Miralax.    Dr. Judithann Graves gave me something for nausea last time I was in for a UTI.    It's not helping much.    The cancer told me to call my PCP.   They say I need a picture of my belly.    2. STRAINING: "Do you have to strain to have a BM?"      I'm not having any BM even after all the above interventions. 3. RECTAL PAIN: "Does your rectum hurt when the stool comes out?" If Yes, ask: "Do you have hemorrhoids? How bad is the pain?"  (Scale 1-10; or mild, moderate, severe)     I have stomach pain.    4. STOOL COMPOSITION: "Are the stools hard?"      Nothing coming out 5. BLOOD ON STOOLS: "Has there been any blood on the toilet tissue or on the surface of the BM?" If Yes, ask: "When was the last time?"     N/A no stool 6. CHRONIC CONSTIPATION: "Is this a new problem for you?"  If No, ask: "How long have you had this problem?" (days, weeks, months)      It is with the treatment 7. CHANGES IN DIET OR HYDRATION: "Have there been any recent changes in your diet?" "How much fluids are you drinking on a daily basis?"  "How much have you had to drink today?"     No     8. MEDICINES:  "Have you been taking any new medicines?" "Are you taking any narcotic pain medicines?" (e.g., Dilaudid, morphine, Percocet, Vicodin)     See above 9. LAXATIVES: "Have you been using any stool softeners, laxatives, or enemas?"  If Yes, ask "What, how often, and when was the last time?"     See above  10. ACTIVITY:  "How much walking do you do every day?"  "Has your activity level decreased in the past week?"        Not asked 11. CAUSE: "What do you think is causing the constipation?"        Radiation treatments, I think 12. OTHER SYMPTOMS: "Do you have any other symptoms?" (e.g., abdomen pain, bloating, fever, vomiting)       I'm having lower abd pain.    13. MEDICAL HISTORY: "Do you have a history of hemorrhoids, rectal fissures, or rectal surgery or rectal abscess?"         Yes internal hemorrhoids  14. PREGNANCY: "Is there any chance you are pregnant?" "When was your  last menstrual period?"       N/A  Protocols used: Constipation-A-AH  Chief Complaint: constipation  Symptoms: No BM for a week.    Taking radiation treatments for liver and lung cancer Frequency: For a week no BM Pertinent Negatives: Patient denies Any of the laxatives and stool softners helping Disposition: [] ED /[] Urgent Care (no appt availability in office) / [x] Appointment(In office/virtual)/ []  [facility]/ [] Home Care/ [] Refused Recommended Disposition /[] [facility]/ []  Follow-up with PCP Additional Notes: Appt made with Dr. Judithann Graves for in the morning

## 2023-04-24 ENCOUNTER — Encounter: Payer: Self-pay | Admitting: Internal Medicine

## 2023-04-24 ENCOUNTER — Ambulatory Visit
Admission: RE | Admit: 2023-04-24 | Discharge: 2023-04-24 | Disposition: A | Discharge status: Home / Self Care | Payer: Medicare HMO | Source: Ambulatory Visit | Attending: Internal Medicine | Admitting: Internal Medicine

## 2023-04-24 ENCOUNTER — Ambulatory Visit
Admission: RE | Admit: 2023-04-24 | Discharge: 2023-04-24 | Disposition: A | Discharge status: Home / Self Care | Payer: Medicare HMO | Attending: Internal Medicine | Admitting: Internal Medicine

## 2023-04-24 ENCOUNTER — Ambulatory Visit (INDEPENDENT_AMBULATORY_CARE_PROVIDER_SITE_OTHER): Payer: Medicare HMO | Admitting: Internal Medicine

## 2023-04-24 VITALS — BP 124/78 | HR 76 | Ht 64.0 in | Wt 140.8 lb

## 2023-04-24 DIAGNOSIS — K5904 Chronic idiopathic constipation: Secondary | ICD-10-CM | POA: Diagnosis not present

## 2023-04-24 DIAGNOSIS — K5900 Constipation, unspecified: Secondary | ICD-10-CM | POA: Diagnosis not present

## 2023-04-24 NOTE — Assessment & Plan Note (Signed)
 Worsening since XRT Continue fluids,  Recommend Mg Citrate Will get Xray to rule out obstruction but exam is reassuring

## 2023-04-24 NOTE — Patient Instructions (Addendum)
 Get Magnesium Citrate from the pharmacy and drink the whole bottle.  Continue plenty of fluids and physical activity as tolerated.

## 2023-04-24 NOTE — Progress Notes (Signed)
 Date:  04/24/2023   Name:  Alexandra Watson   DOB:  [birth year]   MRN:  [id number]   Chief Complaint: Constipation (Started about 10 days ago. Patient has lost appetite. Patient feels bloated and nauseous. Pt wanting XR. Has not had BM for 2 weeks. Patient had radiation treatment on 02/7. Was sick for 3 days after that. Stomach has not been right since radiation treatment. Pt tried miralax, colace, and enemas. )  Constipation This is a new problem. The current episode started in the past 7 days. The problem is unchanged. Stool frequency: no stool in 10 days. The patient is on a high fiber diet. She Does not exercise regularly. There has Been adequate water intake. Pertinent negatives include no abdominal pain or fever.  She tried colace, suppositorites and enema without any stool.  She has not been eating well but has been consuming fluids regularly.  Review of Systems  Constitutional:  Positive for appetite change and fatigue. Negative for chills and fever.  HENT:  Negative for trouble swallowing.   Respiratory:  Negative for chest tightness and shortness of breath.   Cardiovascular:  Negative for chest pain.  Gastrointestinal:  Positive for constipation. Negative for abdominal pain.  Neurological:  Negative for dizziness and headaches.  Psychiatric/Behavioral:  Negative for dysphoric mood and sleep disturbance. The patient is not nervous/anxious.      Lab Results  Component Value Date   NA 139 01/21/2023   K 4.2 01/21/2023   CO2 25 01/21/2023   GLUCOSE 98 01/21/2023   BUN 14 01/21/2023   CREATININE 1.03 (H) 01/21/2023   CALCIUM 10.0 01/21/2023   EGFR 56 (L) 01/21/2023   GFRNONAA 80 01/12/2020   Lab Results  Component Value Date   CHOL 180 01/21/2023   HDL 51 01/21/2023   LDLCALC 106 (H) 01/21/2023   TRIG 128 01/21/2023   CHOLHDL 3.5 01/21/2023   Lab Results  Component Value Date   TSH 1.740 01/21/2023   Lab Results  Component Value Date   HGBA1C 6.2 (H) 01/21/2023    Lab Results  Component Value Date   WBC 3.7 01/21/2023   HGB 11.4 01/21/2023   HCT 34.5 01/21/2023   MCV 90 01/21/2023   PLT 276 01/21/2023   Lab Results  Component Value Date   ALT 15 01/21/2023   AST 18 01/21/2023   ALKPHOS 102 01/21/2023   BILITOT 0.3 01/21/2023   No results found for: "25OHVITD2", "25OHVITD3", "VD25OH"   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   Hemorrhoids 09/04/2022   Low back pain 08/28/2022   Benign essential tremor 01/10/2021   H/O total knee replacement, left 11/08/2020   Allergy to alpha-gal 06/17/2019   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   Primary osteoarthritis of left knee 09/17/2018   Generalized anxiety disorder 11/02/2017   Weight loss, abnormal 10/11/2017   Neuropathy 07/06/2016   SUI (stress urinary incontinence, female) 11/11/2015   Incomplete emptying of bladder 11/11/2015   Recurrent UTI 11/10/2015   Arthritis of knee, left 07/01/2015   Insomnia 06/30/2015   Atrophic vaginitis 06/30/2015   Gastroesophageal reflux disease 06/30/2015   Osteopenia 05/20/2015   Anemia 09/14/2012   Constipation 08/27/2012   Essential hypertension 10/04/2011   Hyperlipidemia, mixed 10/04/2011    Allergies  Allergen Reactions   Galactose Anaphylaxis, Anxiety, Diarrhea, Itching and Shortness Of Breath   Primidone Nausea Only  Iodinated Contrast Media Hives   Oxycodone Nausea And Vomiting and Nausea Only    Past Surgical History:  Procedure Laterality Date   ANKLE ARTHROSCOPY WITH OPEN REDUCTION INTERNAL FIXATION (ORIF)  07/2015   tri-malleolar   BUNIONECTOMY Bilateral 2001, 2005   CHOLECYSTECTOMY     COLONOSCOPY  05/2010   normal   COLONOSCOPY WITH PROPOFOL N/A 10/12/2021   Procedure: COLONOSCOPY WITH PROPOFOL;  Surgeon: Jaynie Collins, DO;  Location: [facility] ENDOSCOPY;  Service: Gastroenterology;  Laterality: N/A;   GANGLION CYST  EXCISION Left    THORACOTOMY / DECORTICATION PARIETAL PLEURA Left 06/2017   [facility]   TOTAL KNEE ARTHROPLASTY Left 11/08/2020   VATS Sleeve lobectomy Left 09/2011   typical variant carcinoid    Social History   Tobacco Use   Smoking status: Former    Current packs/day: 0.00    Average packs/day: 0.3 packs/day for 20.0 years (5.0 ttl pk-yrs)    Types: Cigarettes    Start date: 72    Quit date: 58    Years since quitting: 40.1   Smokeless tobacco: Never   Tobacco comments:    smoking cessation materials not required  Vaping Use   Vaping status: Never Used  Substance Use Topics   Alcohol use: Not Currently    Alcohol/week: 1.0 standard drink of alcohol    Types: 1 Glasses of wine per week    Comment: stopped drinking wine 2 years   Drug use: No     Medication list has been reviewed and updated.  Current Meds  Medication Sig   Ascorbic Acid (VITAMIN C WITH ROSE HIPS) 500 MG tablet Take 500 mg by mouth daily.   buPROPion (WELLBUTRIN XL) 300 MG 24 hr tablet Take 1 tablet (300 mg total) by mouth daily.   Calcium Carbonate-Vit D-Min (CALCIUM 1200 PO) Take 1,200 mg by mouth.   CHOLECALCIFEROL PO Take 1 tablet by mouth daily. 5000 iu   Elderberry 500 MG CAPS Take by mouth.   EPINEPHrine 0.3 mg/0.3 mL IJ SOAJ injection INJECT 0.3 MLS (0.3 MG TOTAL) INTO THE MUSCLE AS NEEDED (ANAPHYLAXIS). FOR UP TO 1 DOSE   gentamicin ointment (GARAMYCIN) 0.1 % 3 (three) times daily.   hydrochlorothiazide (HYDRODIURIL) 25 MG tablet Take 1 tablet (25 mg total) by mouth daily.   hydrocortisone (ANUSOL-HC) 2.5 % rectal cream Place 1 Application rectally 2 (two) times daily.   Melatonin 10 MG TABS Take by mouth at bedtime as needed.   Metoprolol Tartrate 37.5 MG TABS TAKE 1 TABLET BY MOUTH EVERY DAY   Multiple Vitamin (MULTIVITAMIN WITH MINERALS) TABS tablet Take 1 tablet by mouth daily.   nitrofurantoin, macrocrystal-monohydrate, (MACROBID) 100 MG capsule Take 1 capsule (100 mg total) by mouth 2  (two) times daily for 7 days.   ondansetron (ZOFRAN) 4 MG tablet Take 1 tablet (4 mg total) by mouth every 8 (eight) hours as needed for nausea or vomiting.   psyllium (METAMUCIL) 58.6 % powder Take 1 packet by mouth as needed.   simvastatin (ZOCOR) 20 MG tablet TAKE 1 TABLET BY MOUTH EVERY DAY       04/16/2023    1:54 PM 03/01/2023   11:22 AM 01/21/2023   10:00 AM 12/13/2022   10:22 AM  GAD 7 : Generalized Anxiety Score  Nervous, Anxious, on Edge 0 0 0 0  Control/stop worrying 0 0 0 0  Worry too much - different things 0 0 0 1  Trouble relaxing 0 0 0 0  Restless 0 0 0  0  Easily annoyed or irritable 0 0 0 0  Afraid - awful might happen 0 0 0 0  Total GAD 7 Score 0 0 0 1  Anxiety Difficulty Not difficult at all Not difficult at all Not difficult at all Not difficult at all       04/16/2023    1:54 PM 03/01/2023   11:16 AM 01/21/2023   10:00 AM  Depression screen PHQ 2/9  Decreased Interest 0 0 0  Down, Depressed, Hopeless 0 0 0  PHQ - 2 Score 0 0 0  Altered sleeping 0 0 0  Tired, decreased energy 0 0 0  Change in appetite 0 0 0  Feeling bad or failure about yourself  0 0 0  Trouble concentrating 0 0 0  Moving slowly or fidgety/restless 0 0 0  Suicidal thoughts 0 0 0  PHQ-9 Score 0 0 0  Difficult doing work/chores Not difficult at all Not difficult at all Not difficult at all    BP Readings from Last 3 Encounters:  04/24/23 124/78  04/16/23 122/78  03/01/23 (!) 122/50    Physical Exam Vitals and nursing note reviewed.  Constitutional:      General: She is not in acute distress.    Appearance: Normal appearance. She is well-developed.  HENT:     Head: Normocephalic and atraumatic.  Cardiovascular:     Rate and Rhythm: Normal rate and regular rhythm.  Pulmonary:     Effort: Pulmonary effort is normal. No respiratory distress.     Breath sounds: No wheezing or rhonchi.  Abdominal:     General: Abdomen is flat. Bowel sounds are normal.     Palpations: Abdomen is  soft. There is no mass.     Tenderness: There is abdominal tenderness. There is no guarding or rebound.  Skin:    General: Skin is warm and dry.     Findings: No rash.  Neurological:     Mental Status: She is alert and oriented to person, place, and time.  Psychiatric:        Mood and Affect: Mood normal.        Behavior: Behavior normal.     Wt Readings from Last 3 Encounters:  04/24/23 140 lb 12.8 oz (63.9 kg)  04/16/23 138 lb (62.6 kg)  03/01/23 147 lb (66.7 kg)    BP 124/78   Pulse 76   Ht 5\' 4"  (1.626 m)   Wt 140 lb 12.8 oz (63.9 kg)   SpO2 97%   BMI 24.17 kg/m   Assessment and Plan:  Problem List Items Addressed This Visit       Unprioritized   Constipation - Primary   Worsening since XRT Continue fluids,  Recommend Mg Citrate Will get Xray to rule out obstruction but exam is reassuring      Relevant Orders   DG Abd 1 View    No follow-ups on file.    Reubin Milan, MD [facility] [place]

## 2023-04-27 ENCOUNTER — Other Ambulatory Visit: Payer: Self-pay | Admitting: Internal Medicine

## 2023-04-27 DIAGNOSIS — E782 Mixed hyperlipidemia: Secondary | ICD-10-CM

## 2023-04-29 NOTE — Telephone Encounter (Signed)
.   Requested Prescriptions  Pending Prescriptions Disp Refills   simvastatin (ZOCOR) 20 MG tablet [Pharmacy Med Name: SIMVASTATIN 20 MG TABLET] 100 tablet 0    Sig: TAKE 1 TABLET BY MOUTH EVERY DAY     Cardiovascular:  Antilipid - Statins Failed - 04/29/2023  2:50 PM      Failed - Lipid Panel in normal range within the last 12 months    Cholesterol, Total  Date Value Ref Range Status  01/21/2023 180 100 - 199 mg/dL Final   LDL Chol Calc (NIH)  Date Value Ref Range Status  01/21/2023 106 (H) 0 - 99 mg/dL Final   HDL  Date Value Ref Range Status  01/21/2023 51 >39 mg/dL Final   Triglycerides  Date Value Ref Range Status  01/21/2023 128 0 - 149 mg/dL Final         Passed - Patient is not pregnant      Passed - Valid encounter within last 12 months    Recent Outpatient Visits           1 month ago Acute URI   [facility] [place], Melton Alar, PA   3 months ago Annual physical exam   [facility] at [facility], Nyoka Cowden, MD   4 months ago Dysuria   [facility] at [facility], Nyoka Cowden, MD   4 months ago Dysuria   [facility] at [facility], Nyoka Cowden, MD   7 months ago Cystitis   [facility] at [facility], Nyoka Cowden, MD       Future Appointments             In 2 months Judithann Graves, Nyoka Cowden, MD [facility] at [facility], [facility]

## 2023-05-01 ENCOUNTER — Encounter: Payer: Self-pay | Admitting: Internal Medicine

## 2023-05-01 ENCOUNTER — Telehealth: Payer: Self-pay | Admitting: Internal Medicine

## 2023-05-01 ENCOUNTER — Ambulatory Visit: Payer: Self-pay | Admitting: Internal Medicine

## 2023-05-01 ENCOUNTER — Ambulatory Visit: Admitting: Internal Medicine

## 2023-05-01 VITALS — BP 124/76 | HR 71 | Temp 98.3°F | Ht 64.0 in | Wt 145.1 lb

## 2023-05-01 DIAGNOSIS — N3000 Acute cystitis without hematuria: Secondary | ICD-10-CM

## 2023-05-01 MED ORDER — CIPROFLOXACIN HCL 250 MG PO TABS: 250.0000 mg | ORAL_TABLET | Freq: Two times a day (BID) | ORAL | 0 refills | Status: AC

## 2023-05-01 NOTE — Telephone Encounter (Signed)
 See Nurse Triage encounter from 05/01/23. Medication question/request required nurse triage and assessment. Handled by [employer]. Closing this duplicate encounter.

## 2023-05-01 NOTE — Telephone Encounter (Signed)
 Copied from CRM [id number]. Topic: Clinical - Prescription Issue >> May 01, 2023  1:35 PM Marlow Baars wrote: Reason for CRM: The patient called in stating that the nitrofurantoin, macrocrystal-monohydrate, (MACROBID) 100 MG capsule is not helping and that she is still having urinary frequency and cloudiness in her urine. Please assist patient further   Chief Complaint: Urinary Symptoms on Antibiotic Follow Up Symptoms: Cloudy Urine, Dysuria, Frequency Frequency: Ongoing Pertinent Negatives: Patient denies Back pain or feverr Disposition: [] ED /[] Urgent Care (no appt availability in office) / [x] Appointment(In office/virtual)/ []  [facility]/ [] Home Care/ [] Refused Recommended Disposition /[] [facility]/ []  Follow-up with PCP Additional Notes: SJ is being triaged for urinary symptoms while being treated with an antibiotic. The patient states she is still experiencing cloudiness of her urine, dysuria, and frequency at night. The patient is being treated with Nitrofurantoin (Macrobid) and states she has two pills left. In office appointment made for today with Dr. Judithann Graves, PCP. Patient agreed to disposition.    Reason for Disposition  [1] Taking antibiotic > 72 hours (3 days) for UTI AND [2] painful urination or frequency is SAME (unchanged, not better)  Answer Assessment - Initial Assessment Questions 1. MAIN SYMPTOM: "What is the main symptom you are concerned about?" (e.g., painful urination, urine frequency)     Cloudiness, Dysuria, Frequency  2. BETTER-SAME-WORSE: "Are you getting better, staying the same, or getting worse compared to how you felt at your last visit to the doctor (most recent medical visit)?"     Same   3. PAIN: "How bad is the pain?"  (e.g., Scale 1-10; mild, moderate, or severe)   - MILD (1-3): complains slightly about urination hurting   - MODERATE (4-7): interferes with normal activities     - SEVERE (8-10): excruciating, unwilling or unable to  urinate because of the pain      No  4. FEVER: "Do you have a fever?" If Yes, ask: "What is it, how was it measured, and when did it start?"     No  5. OTHER SYMPTOMS: "Do you have any other symptoms?" (e.g., blood in the urine, flank pain, vaginal discharge)     No  6. DIAGNOSIS: "When was the UTI diagnosed?" "By whom?" "Was it a kidney infection, bladder infection or both?"     Dr. Judithann Graves  7. ANTIBIOTIC: "What antibiotic(s) are you taking?" "How many times per day?"     Macrobid  8. ANTIBIOTIC - START DATE: "When did you start taking the antibiotic?"     04/21/2023  Protocols used: Urinary Tract Infection on Antibiotic Follow-up Call - [facility]

## 2023-05-01 NOTE — Progress Notes (Signed)
 Date:  05/01/2023   Name:  Alexandra Watson   DOB:  June 07, 1945   MRN:  [id number]   Chief Complaint: Urinary Tract Infection (Patient said she has no blood in her urine. She said her urine is still very cloudy )  Urinary Tract Infection  This is a new problem. The current episode started in the past 7 days. Progression since onset: no dysuria but urine still cloudy. The patient is experiencing no pain. There has been no fever. Pertinent negatives include no chills, hematuria, nausea or vomiting.  Has 2 more Macrobid to take. Having some external itching of the genitals at times.   Review of Systems  Constitutional:  Positive for fatigue. Negative for chills and fever.  Respiratory:  Negative for chest tightness and shortness of breath.   Gastrointestinal:  Negative for abdominal pain, nausea and vomiting.  Genitourinary:  Negative for dysuria and hematuria.  Neurological:  Negative for dizziness.  Psychiatric/Behavioral:  Negative for sleep disturbance.      Lab Results  Component Value Date   NA 139 01/21/2023   K 4.2 01/21/2023   CO2 25 01/21/2023   GLUCOSE 98 01/21/2023   BUN 14 01/21/2023   CREATININE 1.03 (H) 01/21/2023   CALCIUM 10.0 01/21/2023   EGFR 56 (L) 01/21/2023   GFRNONAA 80 01/12/2020   Lab Results  Component Value Date   CHOL 180 01/21/2023   HDL 51 01/21/2023   LDLCALC 106 (H) 01/21/2023   TRIG 128 01/21/2023   CHOLHDL 3.5 01/21/2023   Lab Results  Component Value Date   TSH 1.740 01/21/2023   Lab Results  Component Value Date   HGBA1C 6.2 (H) 01/21/2023   Lab Results  Component Value Date   WBC 3.7 01/21/2023   HGB 11.4 01/21/2023   HCT 34.5 01/21/2023   MCV 90 01/21/2023   PLT 276 01/21/2023   Lab Results  Component Value Date   ALT 15 01/21/2023   AST 18 01/21/2023   ALKPHOS 102 01/21/2023   BILITOT 0.3 01/21/2023   No results found for: "25OHVITD2", "25OHVITD3", "VD25OH"   Patient Active Problem List   Diagnosis Date Noted    Carpal tunnel syndrome 11/27/2022   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   Hemorrhoids 09/04/2022   Low back pain 08/28/2022   Benign essential tremor 01/10/2021   H/O total knee replacement, left 11/08/2020   Allergy to alpha-gal 06/17/2019   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   Primary osteoarthritis of left knee 09/17/2018   Generalized anxiety disorder 11/02/2017   Weight loss, abnormal 10/11/2017   Neuropathy 07/06/2016   SUI (stress urinary incontinence, female) 11/11/2015   Incomplete emptying of bladder 11/11/2015   Recurrent UTI 11/10/2015   Arthritis of knee, left 07/01/2015   Insomnia 06/30/2015   Atrophic vaginitis 06/30/2015   Gastroesophageal reflux disease 06/30/2015   Osteopenia 05/20/2015   Anemia 09/14/2012   Constipation 08/27/2012   Essential hypertension 10/04/2011   Hyperlipidemia, mixed 10/04/2011    Allergies  Allergen Reactions   Galactose Anaphylaxis, Anxiety, Diarrhea, Itching and Shortness Of Breath   Primidone Nausea Only   Iodinated Contrast Media Hives   Oxycodone Nausea And Vomiting and Nausea Only    Past Surgical History:  Procedure Laterality Date   ANKLE ARTHROSCOPY WITH OPEN REDUCTION INTERNAL FIXATION (ORIF)  07/2015   tri-malleolar   BUNIONECTOMY Bilateral 2001, 2005   CHOLECYSTECTOMY     COLONOSCOPY  05/2010   normal   COLONOSCOPY WITH PROPOFOL N/A 10/12/2021  Procedure: COLONOSCOPY WITH PROPOFOL;  Surgeon: Jaynie Collins, DO;  Location: [facility] ENDOSCOPY;  Service: Gastroenterology;  Laterality: N/A;   GANGLION CYST EXCISION Left    THORACOTOMY / DECORTICATION PARIETAL PLEURA Left 06/2017   [facility]   TOTAL KNEE ARTHROPLASTY Left 11/08/2020   VATS Sleeve lobectomy Left 09/2011   typical variant carcinoid    Social History   Tobacco Use   Smoking status: Former    Current packs/day: 0.00    Average packs/day: 0.3 packs/day for 20.0 years (5.0 ttl pk-yrs)    Types: Cigarettes    Start date: 63     Quit date: 39    Years since quitting: 40.2   Smokeless tobacco: Never   Tobacco comments:    smoking cessation materials not required  Vaping Use   Vaping status: Never Used  Substance Use Topics   Alcohol use: Not Currently    Alcohol/week: 1.0 standard drink of alcohol    Types: 1 Glasses of wine per week    Comment: stopped drinking wine 2 years   Drug use: No     Medication list has been reviewed and updated.  Current Meds  Medication Sig   Ascorbic Acid (VITAMIN C WITH ROSE HIPS) 500 MG tablet Take 500 mg by mouth daily.   buPROPion (WELLBUTRIN XL) 300 MG 24 hr tablet Take 1 tablet (300 mg total) by mouth daily.   Calcium Carbonate-Vit D-Min (CALCIUM 1200 PO) Take 1,200 mg by mouth.   CHOLECALCIFEROL PO Take 1 tablet by mouth daily. 5000 iu   ciprofloxacin (CIPRO) 250 MG tablet Take 1 tablet (250 mg total) by mouth 2 (two) times daily for 3 days.   Elderberry 500 MG CAPS Take by mouth.   EPINEPHrine 0.3 mg/0.3 mL IJ SOAJ injection INJECT 0.3 MLS (0.3 MG TOTAL) INTO THE MUSCLE AS NEEDED (ANAPHYLAXIS). FOR UP TO 1 DOSE   gentamicin ointment (GARAMYCIN) 0.1 % 3 (three) times daily.   hydrochlorothiazide (HYDRODIURIL) 25 MG tablet Take 1 tablet (25 mg total) by mouth daily.   hydrocortisone (ANUSOL-HC) 2.5 % rectal cream Place 1 Application rectally 2 (two) times daily.   Melatonin 10 MG TABS Take by mouth at bedtime as needed.   Metoprolol Tartrate 37.5 MG TABS TAKE 1 TABLET BY MOUTH EVERY DAY   Multiple Vitamin (MULTIVITAMIN WITH MINERALS) TABS tablet Take 1 tablet by mouth daily.   ondansetron (ZOFRAN) 4 MG tablet Take 1 tablet (4 mg total) by mouth every 8 (eight) hours as needed for nausea or vomiting.   psyllium (METAMUCIL) 58.6 % powder Take 1 packet by mouth as needed.   simvastatin (ZOCOR) 20 MG tablet TAKE 1 TABLET BY MOUTH EVERY DAY       05/01/2023    4:11 PM 04/16/2023    1:54 PM 03/01/2023   11:22 AM 01/21/2023   10:00 AM  GAD 7 : Generalized Anxiety Score   Nervous, Anxious, on Edge 0 0 0 0  Control/stop worrying 0 0 0 0  Worry too much - different things 0 0 0 0  Trouble relaxing 0 0 0 0  Restless 0 0 0 0  Easily annoyed or irritable 0 0 0 0  Afraid - awful might happen 0 0 0 0  Total GAD 7 Score 0 0 0 0  Anxiety Difficulty Not difficult at all Not difficult at all Not difficult at all Not difficult at all       05/01/2023    4:11 PM 04/16/2023    1:54 PM  03/01/2023   11:16 AM  Depression screen PHQ 2/9  Decreased Interest 0 0 0  Down, Depressed, Hopeless 0 0 0  PHQ - 2 Score 0 0 0  Altered sleeping 0 0 0  Tired, decreased energy 0 0 0  Change in appetite 0 0 0  Feeling bad or failure about yourself  0 0 0  Trouble concentrating 0 0 0  Moving slowly or fidgety/restless 0 0 0  Suicidal thoughts 0 0 0  PHQ-9 Score 0 0 0  Difficult doing work/chores Not difficult at all Not difficult at all Not difficult at all    BP Readings from Last 3 Encounters:  05/01/23 124/76  04/24/23 124/78  04/16/23 122/78    Physical Exam Cardiovascular:     Rate and Rhythm: Normal rate and regular rhythm.  Pulmonary:     Effort: Pulmonary effort is normal.     Breath sounds: No wheezing or rhonchi.  Abdominal:     General: Abdomen is flat. Bowel sounds are normal.     Tenderness: There is no abdominal tenderness. There is no right CVA tenderness or left CVA tenderness.  Musculoskeletal:     Cervical back: Normal range of motion.     Right lower leg: No edema.     Left lower leg: No edema.  Lymphadenopathy:     Cervical: No cervical adenopathy.  Neurological:     Mental Status: She is alert.     Wt Readings from Last 3 Encounters:  05/01/23 145 lb 2 oz (65.8 kg)  04/24/23 140 lb 12.8 oz (63.9 kg)  04/16/23 138 lb (62.6 kg)    BP 124/76   Pulse 71   Temp 98.3 F (36.8 C)   Ht 5\' 4"  (1.626 m)   Wt 145 lb 2 oz (65.8 kg)   SpO2 98%   BMI 24.91 kg/m   Assessment and Plan:  Problem List Items Addressed This Visit   None Visit  Diagnoses       Acute cystitis without hematuria    -  Primary   Finish Macrobid cipro x 3 days for ongoing coverage of enterococcus   Relevant Medications   ciprofloxacin (CIPRO) 250 MG tablet       No follow-ups on file.    Reubin Milan, MD [facility] [place]

## 2023-05-01 NOTE — Telephone Encounter (Signed)
 Copied from CRM [id number]. Topic: Clinical - Prescription Issue >> May 01, 2023  1:35 PM Alexandra Watson wrote: Reason for CRM: The patient called in stating that the nitrofurantoin, macrocrystal-monohydrate, (MACROBID) 100 MG capsule is not helping and that she is still having urinary frequency and cloudiness in her urine. Please assist patient further

## 2023-05-01 NOTE — Patient Instructions (Signed)
 Use Diaper rash ointment to external genitalia for itching

## 2023-05-03 DIAGNOSIS — C7A090 Malignant carcinoid tumor of the bronchus and lung: Secondary | ICD-10-CM | POA: Diagnosis not present

## 2023-05-31 DIAGNOSIS — Z79899 Other long term (current) drug therapy: Secondary | ICD-10-CM | POA: Diagnosis not present

## 2023-05-31 DIAGNOSIS — C7B09 Secondary carcinoid tumors of other sites: Secondary | ICD-10-CM | POA: Diagnosis not present

## 2023-05-31 DIAGNOSIS — C7A090 Malignant carcinoid tumor of the bronchus and lung: Secondary | ICD-10-CM | POA: Diagnosis not present

## 2023-05-31 DIAGNOSIS — C7A8 Other malignant neuroendocrine tumors: Secondary | ICD-10-CM | POA: Diagnosis not present

## 2023-05-31 DIAGNOSIS — C7B03 Secondary carcinoid tumors of bone: Secondary | ICD-10-CM | POA: Diagnosis not present

## 2023-05-31 DIAGNOSIS — C7B02 Secondary carcinoid tumors of liver: Secondary | ICD-10-CM | POA: Diagnosis not present

## 2023-06-28 DIAGNOSIS — C7A00 Malignant carcinoid tumor of unspecified site: Secondary | ICD-10-CM | POA: Diagnosis not present

## 2023-07-10 ENCOUNTER — Other Ambulatory Visit (HOSPITAL_COMMUNITY)
Admission: RE | Admit: 2023-07-10 | Discharge: 2023-07-10 | Disposition: A | Discharge status: Home / Self Care | Source: Ambulatory Visit | Attending: Internal Medicine | Admitting: Internal Medicine

## 2023-07-10 ENCOUNTER — Other Ambulatory Visit: Payer: Self-pay | Admitting: Internal Medicine

## 2023-07-10 ENCOUNTER — Encounter: Payer: Self-pay | Admitting: Internal Medicine

## 2023-07-10 ENCOUNTER — Ambulatory Visit (INDEPENDENT_AMBULATORY_CARE_PROVIDER_SITE_OTHER): Admitting: Internal Medicine

## 2023-07-10 VITALS — BP 126/70 | HR 63 | Ht 64.0 in | Wt 145.0 lb

## 2023-07-10 DIAGNOSIS — M778 Other enthesopathies, not elsewhere classified: Secondary | ICD-10-CM

## 2023-07-10 DIAGNOSIS — N898 Other specified noninflammatory disorders of vagina: Secondary | ICD-10-CM

## 2023-07-10 DIAGNOSIS — N3001 Acute cystitis with hematuria: Secondary | ICD-10-CM | POA: Diagnosis not present

## 2023-07-10 LAB — POCT URINALYSIS DIPSTICK
Bilirubin, UA: NEGATIVE
Glucose, UA: NEGATIVE
Ketones, UA: NEGATIVE
Nitrite, UA: NEGATIVE
Protein, UA: POSITIVE — AB
Spec Grav, UA: 1.01 (ref 1.010–1.025)
Urobilinogen, UA: 0.2 U/dL
pH, UA: 7.5 (ref 5.0–8.0)

## 2023-07-10 MED ORDER — FLUCONAZOLE 100 MG PO TABS: 100.0000 mg | ORAL_TABLET | Freq: Every day | ORAL | 0 refills | Status: AC

## 2023-07-10 NOTE — Patient Instructions (Signed)
 Hold the simvastatin  while taking Diflucan

## 2023-07-10 NOTE — Progress Notes (Signed)
 Date:  07/10/2023   Name:  Alexandra Watson   DOB:  September 09, 1945   MRN:  [id number]   Chief Complaint: Vaginal Itching (Patient said she has had the itching for 2 - 3 days)  Shoulder Pain  The pain is present in the right shoulder. This is a new problem. The current episode started 1 to 4 weeks ago. The problem occurs constantly. The problem has been unchanged. The quality of the pain is described as aching. The pain is mild.  Vaginal Itching The patient's primary symptoms include genital itching. This is a new problem. Episode onset: three days ago. The problem occurs constantly. The problem has been unchanged. The pain is mild. The problem affects both sides. Pertinent negatives include no chills or headaches. Treatments tried: topical monistat. The treatment provided mild relief.    Review of Systems  Constitutional:  Negative for chills and fatigue.  Respiratory:  Positive for shortness of breath. Negative for chest tightness.   Cardiovascular:  Negative for chest pain and palpitations.  Musculoskeletal:  Positive for arthralgias (right shoulder).  Neurological:  Negative for dizziness, light-headedness and headaches.  Psychiatric/Behavioral:  Negative for dysphoric mood and sleep disturbance. The patient is not nervous/anxious.      Lab Results  Component Value Date   NA 139 01/21/2023   K 4.2 01/21/2023   CO2 25 01/21/2023   GLUCOSE 98 01/21/2023   BUN 14 01/21/2023   CREATININE 1.03 (H) 01/21/2023   CALCIUM 10.0 01/21/2023   EGFR 56 (L) 01/21/2023   GFRNONAA 80 01/12/2020   Lab Results  Component Value Date   CHOL 180 01/21/2023   HDL 51 01/21/2023   LDLCALC 106 (H) 01/21/2023   TRIG 128 01/21/2023   CHOLHDL 3.5 01/21/2023   Lab Results  Component Value Date   TSH 1.740 01/21/2023   Lab Results  Component Value Date   HGBA1C 6.2 (H) 01/21/2023   Lab Results  Component Value Date   WBC 3.7 01/21/2023   HGB 11.4 01/21/2023   HCT 34.5 01/21/2023   MCV 90  01/21/2023   PLT 276 01/21/2023   Lab Results  Component Value Date   ALT 15 01/21/2023   AST 18 01/21/2023   ALKPHOS 102 01/21/2023   BILITOT 0.3 01/21/2023   No results found for: "25OHVITD2", "25OHVITD3", "VD25OH"   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   Hemorrhoids 09/04/2022   Low back pain 08/28/2022   Benign essential tremor 01/10/2021   H/O total knee replacement, left 11/08/2020   Allergy to alpha-gal 06/17/2019   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   Primary osteoarthritis of left knee 09/17/2018   Generalized anxiety disorder 11/02/2017   Weight loss, abnormal 10/11/2017   Neuropathy 07/06/2016   SUI (stress urinary incontinence, female) 11/11/2015   Incomplete emptying of bladder 11/11/2015   Recurrent UTI 11/10/2015   Arthritis of knee, left 07/01/2015   Insomnia 06/30/2015   Atrophic vaginitis 06/30/2015   Gastroesophageal reflux disease 06/30/2015   Osteopenia 05/20/2015   Anemia 09/14/2012   Constipation 08/27/2012   Essential hypertension 10/04/2011   Hyperlipidemia, mixed 10/04/2011    Allergies  Allergen Reactions   Galactose Anaphylaxis, Anxiety, Diarrhea, Itching and Shortness Of Breath   Primidone  Nausea Only   Iodinated Contrast Media Hives   Oxycodone Nausea And Vomiting and Nausea Only    Past Surgical History:  Procedure Laterality Date  ANKLE ARTHROSCOPY WITH OPEN REDUCTION INTERNAL FIXATION (ORIF)  07/2015   tri-malleolar   BUNIONECTOMY Bilateral 2001, 2005   CHOLECYSTECTOMY     COLONOSCOPY  05/2010   normal   COLONOSCOPY WITH PROPOFOL  N/A 10/12/2021   Procedure: COLONOSCOPY WITH PROPOFOL ;  Surgeon: Quintin Buckle, DO;  Location: [facility] ENDOSCOPY;  Service: Gastroenterology;  Laterality: N/A;   GANGLION CYST EXCISION Left    THORACOTOMY / DECORTICATION PARIETAL PLEURA Left 06/2017   [facility]   TOTAL KNEE ARTHROPLASTY Left  11/08/2020   VATS Sleeve lobectomy Left 09/2011   typical variant carcinoid    Social History   Tobacco Use   Smoking status: Former    Current packs/day: 0.00    Average packs/day: 0.3 packs/day for 20.0 years (5.0 ttl pk-yrs)    Types: Cigarettes    Start date: 47    Quit date: 48    Years since quitting: 40.3   Smokeless tobacco: Never   Tobacco comments:    smoking cessation materials not required  Vaping Use   Vaping status: Never Used  Substance Use Topics   Alcohol use: Not Currently    Alcohol/week: 1.0 standard drink of alcohol    Types: 1 Glasses of wine per week    Comment: stopped drinking wine 2 years   Drug use: No     Medication list has been reviewed and updated.  Current Meds  Medication Sig   Ascorbic Acid (VITAMIN C WITH ROSE HIPS) 500 MG tablet Take 500 mg by mouth daily.   buPROPion  (WELLBUTRIN  XL) 300 MG 24 hr tablet Take 1 tablet (300 mg total) by mouth daily.   Calcium Carbonate-Vit D-Min (CALCIUM 1200 PO) Take 1,200 mg by mouth.   CHOLECALCIFEROL PO Take 1 tablet by mouth daily. 5000 iu   Elderberry 500 MG CAPS Take by mouth.   EPINEPHrine  0.3 mg/0.3 mL IJ SOAJ injection INJECT 0.3 MLS (0.3 MG TOTAL) INTO THE MUSCLE AS NEEDED (ANAPHYLAXIS). FOR UP TO 1 DOSE   fluconazole (DIFLUCAN) 100 MG tablet Take 1 tablet (100 mg total) by mouth daily for 3 days.   gentamicin ointment (GARAMYCIN) 0.1 % 3 (three) times daily.   hydrochlorothiazide  (HYDRODIURIL ) 25 MG tablet Take 1 tablet (25 mg total) by mouth daily.   hydrocortisone  (ANUSOL -HC) 2.5 % rectal cream Place 1 Application rectally 2 (two) times daily.   Melatonin 10 MG TABS Take by mouth at bedtime as needed.   Metoprolol  Tartrate 37.5 MG TABS TAKE 1 TABLET BY MOUTH EVERY DAY   Multiple Vitamin (MULTIVITAMIN WITH MINERALS) TABS tablet Take 1 tablet by mouth daily.   psyllium (METAMUCIL) 58.6 % powder Take 1 packet by mouth as needed.   simvastatin  (ZOCOR ) 20 MG tablet TAKE 1 TABLET BY MOUTH  EVERY DAY       07/10/2023    2:06 PM 05/01/2023    4:11 PM 04/16/2023    1:54 PM 03/01/2023   11:22 AM  GAD 7 : Generalized Anxiety Score  Nervous, Anxious, on Edge 0 0 0 0  Control/stop worrying 0 0 0 0  Worry too much - different things 0 0 0 0  Trouble relaxing 0 0 0 0  Restless 0 0 0 0  Easily annoyed or irritable 0 0 0 0  Afraid - awful might happen 0 0 0 0  Total GAD 7 Score 0 0 0 0  Anxiety Difficulty Not difficult at all Not difficult at all Not difficult at all Not difficult at all  07/10/2023    2:06 PM 05/01/2023    4:11 PM 04/16/2023    1:54 PM  Depression screen PHQ 2/9  Decreased Interest 0 0 0  Down, Depressed, Hopeless 0 0 0  PHQ - 2 Score 0 0 0  Altered sleeping 0 0 0  Tired, decreased energy 0 0 0  Change in appetite 0 0 0  Feeling bad or failure about yourself  0 0 0  Trouble concentrating 0 0 0  Moving slowly or fidgety/restless 0 0 0  Suicidal thoughts 0 0 0  PHQ-9 Score 0 0 0  Difficult doing work/chores Not difficult at all Not difficult at all Not difficult at all    BP Readings from Last 3 Encounters:  07/10/23 126/70  05/01/23 124/76  04/24/23 124/78    Physical Exam Constitutional:      Appearance: Normal appearance.  Cardiovascular:     Rate and Rhythm: Normal rate and regular rhythm.  Pulmonary:     Effort: Pulmonary effort is normal.     Breath sounds: No wheezing or rhonchi.  Abdominal:     General: Bowel sounds are normal.     Tenderness: There is no abdominal tenderness. There is no right CVA tenderness or left CVA tenderness.  Musculoskeletal:     Right shoulder: Tenderness (over upper arm) present. No bony tenderness or crepitus. Normal range of motion.  Skin:    General: Skin is warm and dry.    Lab Results  Component Value Date   COLORU YELLOW 07/10/2023   CLARITYU CLEAR 07/10/2023   GLUCOSEUR Negative 07/10/2023   BILIRUBINUR NEGATIVE 07/10/2023   KETONESU NEGATIVE 07/10/2023   SPECGRAV 1.010 07/10/2023   RBCUR  SMALL (A) 07/10/2023   PHUR 7.5 07/10/2023   PROTEINUR Positive (A) 07/10/2023   UROBILINOGEN 0.2 07/10/2023   LEUKOCYTESUR Large (3+) (A) 07/10/2023     Wt Readings from Last 3 Encounters:  07/10/23 145 lb (65.8 kg)  05/01/23 145 lb 2 oz (65.8 kg)  04/24/23 140 lb 12.8 oz (63.9 kg)    BP 126/70   Pulse 63   Ht 5\' 4"  (1.626 m)   Wt 145 lb (65.8 kg)   SpO2 98%   BMI 24.89 kg/m   Assessment and Plan:  Problem List Items Addressed This Visit   None Visit Diagnoses       Vaginal itching    -  Primary   suspect yeast vaginitis swab obtained treat presumptive with Diflucan x 3 days   Relevant Medications   fluconazole (DIFLUCAN) 100 MG tablet   Other Relevant Orders   Cervicovaginal ancillary only   POCT urinalysis dipstick (Completed)     Acute cystitis with hematuria       will obtain Urine Cx then treat   Relevant Orders   Urine Culture     Shoulder tendonitis, right       Recommend topical rubs or Solanpas patches can also use heat or ice as needed if no improvement, rec Sports Med eval       No follow-ups on file.    Sheron Dixons, MD [facility] [place]

## 2023-07-12 ENCOUNTER — Ambulatory Visit: Payer: Self-pay | Admitting: Internal Medicine

## 2023-07-12 LAB — CERVICOVAGINAL ANCILLARY ONLY
Bacterial Vaginitis (gardnerella): NEGATIVE
Candida Glabrata: NEGATIVE
Candida Vaginitis: NEGATIVE
Comment: NEGATIVE
Comment: NEGATIVE
Comment: NEGATIVE
Comment: NEGATIVE
Trichomonas: NEGATIVE

## 2023-07-15 ENCOUNTER — Ambulatory Visit: Payer: Self-pay | Admitting: Internal Medicine

## 2023-07-15 LAB — URINE CULTURE

## 2023-07-23 ENCOUNTER — Ambulatory Visit (INDEPENDENT_AMBULATORY_CARE_PROVIDER_SITE_OTHER): Payer: Self-pay | Admitting: Internal Medicine

## 2023-07-23 ENCOUNTER — Ambulatory Visit
Admission: RE | Admit: 2023-07-23 | Discharge: 2023-07-23 | Disposition: A | Discharge status: Home / Self Care | Source: Ambulatory Visit | Attending: Internal Medicine | Admitting: Internal Medicine

## 2023-07-23 ENCOUNTER — Ambulatory Visit
Admission: RE | Admit: 2023-07-23 | Discharge: 2023-07-23 | Disposition: A | Discharge status: Home / Self Care | Attending: Internal Medicine | Admitting: Internal Medicine

## 2023-07-23 ENCOUNTER — Encounter: Payer: Self-pay | Admitting: Internal Medicine

## 2023-07-23 VITALS — BP 124/62 | HR 71 | Ht 64.0 in | Wt 146.0 lb

## 2023-07-23 DIAGNOSIS — E782 Mixed hyperlipidemia: Secondary | ICD-10-CM | POA: Diagnosis not present

## 2023-07-23 DIAGNOSIS — I808 Phlebitis and thrombophlebitis of other sites: Secondary | ICD-10-CM

## 2023-07-23 DIAGNOSIS — G5603 Carpal tunnel syndrome, bilateral upper limbs: Secondary | ICD-10-CM

## 2023-07-23 DIAGNOSIS — M25511 Pain in right shoulder: Secondary | ICD-10-CM

## 2023-07-23 DIAGNOSIS — G8929 Other chronic pain: Secondary | ICD-10-CM

## 2023-07-23 DIAGNOSIS — N952 Postmenopausal atrophic vaginitis: Secondary | ICD-10-CM

## 2023-07-23 DIAGNOSIS — G250 Essential tremor: Secondary | ICD-10-CM | POA: Diagnosis not present

## 2023-07-23 DIAGNOSIS — I10 Essential (primary) hypertension: Secondary | ICD-10-CM

## 2023-07-23 DIAGNOSIS — M19011 Primary osteoarthritis, right shoulder: Secondary | ICD-10-CM | POA: Diagnosis not present

## 2023-07-23 MED ORDER — HYDROCHLOROTHIAZIDE 25 MG PO TABS: 25.0000 mg | ORAL_TABLET | Freq: Every day | ORAL | 1 refills | Status: DC

## 2023-07-23 MED ORDER — ESTRADIOL 0.1 MG/GM VA CREA: TOPICAL_CREAM | VAGINAL | 12 refills | Status: AC

## 2023-07-23 MED ORDER — SIMVASTATIN 20 MG PO TABS: 20.0000 mg | ORAL_TABLET | Freq: Every day | ORAL | 1 refills | Status: DC

## 2023-07-23 NOTE — Assessment & Plan Note (Signed)
 Recent UA and culture negative. Aptima swab negative; no response to diflucan  Will start estrogen cream applied nightly to vaginal introitus

## 2023-07-23 NOTE — Assessment & Plan Note (Signed)
 Tremor is becoming more symptomatic. She failed a trial of gabapentin  previously. Consider referral back to Neurology.

## 2023-07-23 NOTE — Assessment & Plan Note (Signed)
 Bilaterally per EMG at [facility] She wants to see a Biomedical engineer - she will call [facility] since she is established there for other issues.

## 2023-07-23 NOTE — Assessment & Plan Note (Signed)
 Blood pressure is well controlled.  Current medications metoprolol  and hydrochlorothiazide . Will continue same regimen along with efforts to limit dietary sodium.

## 2023-07-23 NOTE — Assessment & Plan Note (Signed)
 Cholesterol controlled. Continue simvastatin  daily.  Refill sent.

## 2023-07-23 NOTE — Progress Notes (Signed)
 Date:  07/23/2023   Name:  Alexandra Watson   DOB:  [birth year]   MRN:  [id number]   Chief Complaint: Hypertension  Hypertension This is a chronic problem. The problem is controlled. Pertinent negatives include no chest pain, headaches or shortness of breath. Past treatments include beta blockers and diuretics. The current treatment provides significant improvement. There is no history of kidney disease, CAD/MI or CVA.  Vaginal Itching The patient's primary symptoms include genital itching and vaginal discharge (itching). This is a recurrent problem. The problem occurs daily. The problem has been unchanged. The patient is experiencing no pain. Pertinent negatives include no chills, fever, headaches or rash.  Shoulder Pain  The pain is present in the right shoulder. This is a chronic problem. The problem has been gradually worsening. The quality of the pain is described as aching and burning. The pain is moderate. Associated symptoms include a limited range of motion. Pertinent negatives include no fever.  Arm Pain  There was no injury mechanism (vein prominent with tenderness at site of previous IV for chemo). Pertinent negatives include no chest pain.  Tremor - in both hands, interfering with eating, drinking and writing.  Has seen Neurology in 2023 CTS - seen by [facility] with EMG+ on both wrists.  Surgeon only goes to [facility] to operate and she wants to stay local.   Review of Systems  Constitutional:  Negative for chills, fatigue and fever.  Respiratory:  Negative for chest tightness, shortness of breath and wheezing.   Cardiovascular:  Negative for chest pain and leg swelling.  Genitourinary:  Positive for vaginal discharge (itching).  Musculoskeletal:  Positive for arthralgias.  Skin:  Negative for color change and rash.  Neurological:  Positive for tremors. Negative for dizziness and headaches.  Psychiatric/Behavioral:  Negative for dysphoric mood and sleep disturbance. The  patient is not nervous/anxious.      Lab Results  Component Value Date   NA 139 01/21/2023   K 4.2 01/21/2023   CO2 25 01/21/2023   GLUCOSE 98 01/21/2023   BUN 14 01/21/2023   CREATININE 1.03 (H) 01/21/2023   CALCIUM 10.0 01/21/2023   EGFR 56 (L) 01/21/2023   GFRNONAA 80 01/12/2020   Lab Results  Component Value Date   CHOL 180 01/21/2023   HDL 51 01/21/2023   LDLCALC 106 (H) 01/21/2023   TRIG 128 01/21/2023   CHOLHDL 3.5 01/21/2023   Lab Results  Component Value Date   TSH 1.740 01/21/2023   Lab Results  Component Value Date   HGBA1C 6.2 (H) 01/21/2023   Lab Results  Component Value Date   WBC 3.7 01/21/2023   HGB 11.4 01/21/2023   HCT 34.5 01/21/2023   MCV 90 01/21/2023   PLT 276 01/21/2023   Lab Results  Component Value Date   ALT 15 01/21/2023   AST 18 01/21/2023   ALKPHOS 102 01/21/2023   BILITOT 0.3 01/21/2023   No results found for: "25OHVITD2", "25OHVITD3", "VD25OH"   Patient Active Problem List   Diagnosis Date Noted   Bilateral carpal tunnel syndrome 11/27/2022   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   Hemorrhoids 09/04/2022   Low back pain 08/28/2022   Benign essential tremor 01/10/2021   H/O total knee replacement, left 11/08/2020   Allergy to alpha-gal 06/17/2019   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   Primary osteoarthritis of left knee 09/17/2018   Generalized anxiety disorder 11/02/2017   Weight loss, abnormal 10/11/2017   Neuropathy 07/06/2016  SUI (stress urinary incontinence, female) 11/11/2015   Incomplete emptying of bladder 11/11/2015   Recurrent UTI 11/10/2015   Arthritis of knee, left 07/01/2015   Insomnia 06/30/2015   Atrophic vaginitis 06/30/2015   Gastroesophageal reflux disease 06/30/2015   Osteopenia 05/20/2015   Anemia 09/14/2012   Constipation 08/27/2012   Essential hypertension 10/04/2011   Hyperlipidemia, mixed 10/04/2011    Allergies  Allergen Reactions   Galactose Anaphylaxis, Anxiety,  Diarrhea, Itching and Shortness Of Breath   Primidone  Nausea Only   Iodinated Contrast Media Hives   Oxycodone Nausea And Vomiting and Nausea Only    Past Surgical History:  Procedure Laterality Date   ANKLE ARTHROSCOPY WITH OPEN REDUCTION INTERNAL FIXATION (ORIF)  07/2015   tri-malleolar   BUNIONECTOMY Bilateral 2001, 2005   CHOLECYSTECTOMY     COLONOSCOPY  05/2010   normal   COLONOSCOPY WITH PROPOFOL  N/A 10/12/2021   Procedure: COLONOSCOPY WITH PROPOFOL ;  Surgeon: Quintin Buckle, DO;  Location: [facility] ENDOSCOPY;  Service: Gastroenterology;  Laterality: N/A;   GANGLION CYST EXCISION Left    THORACOTOMY / DECORTICATION PARIETAL PLEURA Left 06/2017   [facility]   TOTAL KNEE ARTHROPLASTY Left 11/08/2020   VATS Sleeve lobectomy Left 09/2011   typical variant carcinoid    Social History   Tobacco Use   Smoking status: Former    Current packs/day: 0.00    Average packs/day: 0.3 packs/day for 20.0 years (5.0 ttl pk-yrs)    Types: Cigarettes    Start date: 84    Quit date: 71    Years since quitting: 40.4   Smokeless tobacco: Never   Tobacco comments:    smoking cessation materials not required  Vaping Use   Vaping status: Never Used  Substance Use Topics   Alcohol use: Not Currently    Alcohol/week: 1.0 standard drink of alcohol    Types: 1 Glasses of wine per week    Comment: stopped drinking wine 2 years   Drug use: No     Medication list has been reviewed and updated.  Current Meds  Medication Sig   Ascorbic Acid (VITAMIN C WITH ROSE HIPS) 500 MG tablet Take 500 mg by mouth daily.   buPROPion  (WELLBUTRIN  XL) 300 MG 24 hr tablet Take 1 tablet (300 mg total) by mouth daily.   Calcium Carbonate-Vit D-Min (CALCIUM 1200 PO) Take 1,200 mg by mouth.   CHOLECALCIFEROL PO Take 1 tablet by mouth daily. 5000 iu   Elderberry 500 MG CAPS Take by mouth.   EPINEPHrine  0.3 mg/0.3 mL IJ SOAJ injection INJECT 0.3 MLS (0.3 MG TOTAL) INTO THE MUSCLE AS NEEDED (ANAPHYLAXIS). FOR UP  TO 1 DOSE   estradiol  (ESTRACE  VAGINAL) 0.1 MG/GM vaginal cream Apply pea sized amount of cream to external vaginal area nightly.   gentamicin ointment (GARAMYCIN) 0.1 % 3 (three) times daily.   hydrocortisone  (ANUSOL -HC) 2.5 % rectal cream Place 1 Application rectally 2 (two) times daily.   Melatonin 10 MG TABS Take by mouth at bedtime as needed.   Metoprolol  Tartrate 37.5 MG TABS TAKE 1 TABLET BY MOUTH EVERY DAY   Multiple Vitamin (MULTIVITAMIN WITH MINERALS) TABS tablet Take 1 tablet by mouth daily.   ondansetron  (ZOFRAN ) 4 MG tablet Take 1 tablet (4 mg total) by mouth every 8 (eight) hours as needed for nausea or vomiting.   psyllium (METAMUCIL) 58.6 % powder Take 1 packet by mouth as needed.   [DISCONTINUED] hydrochlorothiazide  (HYDRODIURIL ) 25 MG tablet Take 1 tablet (25 mg total) by mouth daily.   [  DISCONTINUED] simvastatin  (ZOCOR ) 20 MG tablet TAKE 1 TABLET BY MOUTH EVERY DAY       07/23/2023   10:13 AM 07/10/2023    2:06 PM 05/01/2023    4:11 PM 04/16/2023    1:54 PM  GAD 7 : Generalized Anxiety Score  Nervous, Anxious, on Edge 0 0 0 0  Control/stop worrying 0 0 0 0  Worry too much - different things 2 0 0 0  Trouble relaxing 0 0 0 0  Restless 0 0 0 0  Easily annoyed or irritable 0 0 0 0  Afraid - awful might happen 0 0 0 0  Total GAD 7 Score 2 0 0 0  Anxiety Difficulty Not difficult at all Not difficult at all Not difficult at all Not difficult at all       07/23/2023   10:13 AM 07/10/2023    2:06 PM 05/01/2023    4:11 PM  Depression screen PHQ 2/9  Decreased Interest 0 0 0  Down, Depressed, Hopeless 0 0 0  PHQ - 2 Score 0 0 0  Altered sleeping 0 0 0  Tired, decreased energy 0 0 0  Change in appetite 0 0 0  Feeling bad or failure about yourself  0 0 0  Trouble concentrating 0 0 0  Moving slowly or fidgety/restless 0 0 0  Suicidal thoughts 0 0 0  PHQ-9 Score 0 0 0  Difficult doing work/chores Not difficult at all Not difficult at all Not difficult at all    BP  Readings from Last 3 Encounters:  07/23/23 124/62  07/10/23 126/70  05/01/23 124/76    Physical Exam Vitals and nursing note reviewed.  Constitutional:      General: She is not in acute distress.    Appearance: She is well-developed.  HENT:     Head: Normocephalic and atraumatic.  Cardiovascular:     Rate and Rhythm: Normal rate and regular rhythm.     Heart sounds: No murmur heard.    Comments: Thrombus in antecubital vein on left. Pulmonary:     Effort: Pulmonary effort is normal. No respiratory distress.     Breath sounds: No wheezing or rhonchi.  Musculoskeletal:     Right shoulder: No swelling, deformity, bony tenderness or crepitus. Decreased range of motion.     Cervical back: Normal range of motion.     Right lower leg: No edema.     Left lower leg: No edema.  Lymphadenopathy:     Cervical: No cervical adenopathy.  Skin:    General: Skin is warm and dry.     Findings: No rash.  Neurological:     Mental Status: She is alert and oriented to person, place, and time.     Motor: Tremor (with movement; absent at rest) present.  Psychiatric:        Mood and Affect: Mood normal.        Behavior: Behavior normal.     Wt Readings from Last 3 Encounters:  07/23/23 146 lb (66.2 kg)  07/10/23 145 lb (65.8 kg)  05/01/23 145 lb 2 oz (65.8 kg)    BP 124/62   Pulse 71   Ht 5\' 4"  (1.626 m)   Wt 146 lb (66.2 kg)   SpO2 98%   BMI 25.06 kg/m   Assessment and Plan:  Problem List Items Addressed This Visit       Unprioritized   Essential hypertension - Primary (Chronic)   Blood pressure is well controlled.  Current  medications metoprolol  and hydrochlorothiazide . Will continue same regimen along with efforts to limit dietary sodium.       Relevant Medications   hydrochlorothiazide  (HYDRODIURIL ) 25 MG tablet   simvastatin  (ZOCOR ) 20 MG tablet   Hyperlipidemia, mixed (Chronic)   Cholesterol controlled. Continue simvastatin  daily.  Refill sent.      Relevant  Medications   hydrochlorothiazide  (HYDRODIURIL ) 25 MG tablet   simvastatin  (ZOCOR ) 20 MG tablet   Benign essential tremor (Chronic)   Tremor is becoming more symptomatic. She failed a trial of gabapentin  previously. Consider referral back to Neurology.      Atrophic vaginitis   Recent UA and culture negative. Aptima swab negative; no response to diflucan  Will start estrogen cream applied nightly to vaginal introitus       Relevant Medications   estradiol (ESTRACE VAGINAL) 0.1 MG/GM vaginal cream   Bilateral carpal tunnel syndrome   Bilaterally per EMG at [facility] She wants to see a local surgeon - she will call [facility] since she is established there for other issues.      Other Visit Diagnoses       Superficial phlebitis of arm       recommend warm compresses ask Oncology to use a different vein   Relevant Medications   hydrochlorothiazide  (HYDRODIURIL ) 25 MG tablet   simvastatin  (ZOCOR ) 20 MG tablet     Chronic right shoulder pain       Relevant Orders   DG Shoulder Right       No follow-ups on file.    Sheron Dixons, MD [facility] [place]

## 2023-07-26 DIAGNOSIS — C7A090 Malignant carcinoid tumor of the bronchus and lung: Secondary | ICD-10-CM | POA: Diagnosis not present

## 2023-07-28 ENCOUNTER — Ambulatory Visit: Payer: Self-pay | Admitting: Internal Medicine

## 2023-08-01 DIAGNOSIS — G629 Polyneuropathy, unspecified: Secondary | ICD-10-CM | POA: Diagnosis not present

## 2023-08-01 DIAGNOSIS — F411 Generalized anxiety disorder: Secondary | ICD-10-CM | POA: Diagnosis not present

## 2023-08-01 DIAGNOSIS — K219 Gastro-esophageal reflux disease without esophagitis: Secondary | ICD-10-CM | POA: Diagnosis not present

## 2023-08-01 DIAGNOSIS — I129 Hypertensive chronic kidney disease with stage 1 through stage 4 chronic kidney disease, or unspecified chronic kidney disease: Secondary | ICD-10-CM | POA: Diagnosis not present

## 2023-08-01 DIAGNOSIS — E785 Hyperlipidemia, unspecified: Secondary | ICD-10-CM | POA: Diagnosis not present

## 2023-08-01 DIAGNOSIS — K5900 Constipation, unspecified: Secondary | ICD-10-CM | POA: Diagnosis not present

## 2023-08-01 DIAGNOSIS — N1831 Chronic kidney disease, stage 3a: Secondary | ICD-10-CM | POA: Diagnosis not present

## 2023-08-01 DIAGNOSIS — D84822 Immunodeficiency due to external causes: Secondary | ICD-10-CM | POA: Diagnosis not present

## 2023-08-01 DIAGNOSIS — C220 Liver cell carcinoma: Secondary | ICD-10-CM | POA: Diagnosis not present

## 2023-08-01 DIAGNOSIS — F324 Major depressive disorder, single episode, in partial remission: Secondary | ICD-10-CM | POA: Diagnosis not present

## 2023-08-01 DIAGNOSIS — M1990 Unspecified osteoarthritis, unspecified site: Secondary | ICD-10-CM | POA: Diagnosis not present

## 2023-08-01 DIAGNOSIS — Z8249 Family history of ischemic heart disease and other diseases of the circulatory system: Secondary | ICD-10-CM | POA: Diagnosis not present

## 2023-08-21 ENCOUNTER — Encounter: Payer: Self-pay | Admitting: Internal Medicine

## 2023-08-21 ENCOUNTER — Ambulatory Visit (INDEPENDENT_AMBULATORY_CARE_PROVIDER_SITE_OTHER): Admitting: Internal Medicine

## 2023-08-21 VITALS — BP 128/68 | HR 97 | Temp 102.4°F | Ht 64.0 in | Wt 144.0 lb

## 2023-08-21 DIAGNOSIS — R0989 Other specified symptoms and signs involving the circulatory and respiratory systems: Secondary | ICD-10-CM

## 2023-08-21 DIAGNOSIS — D649 Anemia, unspecified: Secondary | ICD-10-CM | POA: Diagnosis not present

## 2023-08-21 DIAGNOSIS — E785 Hyperlipidemia, unspecified: Secondary | ICD-10-CM | POA: Diagnosis not present

## 2023-08-21 DIAGNOSIS — R5081 Fever presenting with conditions classified elsewhere: Secondary | ICD-10-CM | POA: Diagnosis not present

## 2023-08-21 DIAGNOSIS — F419 Anxiety disorder, unspecified: Secondary | ICD-10-CM | POA: Diagnosis not present

## 2023-08-21 DIAGNOSIS — K5909 Other constipation: Secondary | ICD-10-CM | POA: Diagnosis not present

## 2023-08-21 DIAGNOSIS — C787 Secondary malignant neoplasm of liver and intrahepatic bile duct: Secondary | ICD-10-CM | POA: Diagnosis not present

## 2023-08-21 DIAGNOSIS — M1990 Unspecified osteoarthritis, unspecified site: Secondary | ICD-10-CM | POA: Diagnosis not present

## 2023-08-21 DIAGNOSIS — K7689 Other specified diseases of liver: Secondary | ICD-10-CM | POA: Diagnosis not present

## 2023-08-21 DIAGNOSIS — R1114 Bilious vomiting: Secondary | ICD-10-CM | POA: Diagnosis not present

## 2023-08-21 DIAGNOSIS — C7951 Secondary malignant neoplasm of bone: Secondary | ICD-10-CM | POA: Diagnosis not present

## 2023-08-21 DIAGNOSIS — C782 Secondary malignant neoplasm of pleura: Secondary | ICD-10-CM | POA: Diagnosis not present

## 2023-08-21 DIAGNOSIS — K5641 Fecal impaction: Secondary | ICD-10-CM | POA: Diagnosis not present

## 2023-08-21 DIAGNOSIS — Z1152 Encounter for screening for COVID-19: Secondary | ICD-10-CM | POA: Diagnosis not present

## 2023-08-21 DIAGNOSIS — I10 Essential (primary) hypertension: Secondary | ICD-10-CM | POA: Diagnosis not present

## 2023-08-21 DIAGNOSIS — C3490 Malignant neoplasm of unspecified part of unspecified bronchus or lung: Secondary | ICD-10-CM | POA: Diagnosis not present

## 2023-08-21 DIAGNOSIS — E871 Hypo-osmolality and hyponatremia: Secondary | ICD-10-CM | POA: Diagnosis not present

## 2023-08-21 DIAGNOSIS — R0902 Hypoxemia: Secondary | ICD-10-CM | POA: Diagnosis not present

## 2023-08-21 DIAGNOSIS — Z87891 Personal history of nicotine dependence: Secondary | ICD-10-CM | POA: Diagnosis not present

## 2023-08-21 DIAGNOSIS — J181 Lobar pneumonia, unspecified organism: Secondary | ICD-10-CM | POA: Diagnosis not present

## 2023-08-21 DIAGNOSIS — Z7982 Long term (current) use of aspirin: Secondary | ICD-10-CM | POA: Diagnosis not present

## 2023-08-21 DIAGNOSIS — R54 Age-related physical debility: Secondary | ICD-10-CM | POA: Diagnosis not present

## 2023-08-21 DIAGNOSIS — R251 Tremor, unspecified: Secondary | ICD-10-CM | POA: Diagnosis not present

## 2023-08-21 DIAGNOSIS — J189 Pneumonia, unspecified organism: Secondary | ICD-10-CM | POA: Diagnosis not present

## 2023-08-21 NOTE — Progress Notes (Signed)
 Date:  08/21/2023   Name:  Alexandra Watson   DOB:  1945/04/01   MRN:  [id number]   Chief Complaint: Emesis (Vomiting yellow Bile, malaise since 6 days ago. Pt wants to get in with [facility] since this is where her oncologist is. Pt is here today with her spouse Vaughan.)  Emesis  This is a new problem. The current episode started in the past 7 days. The problem occurs 5 to 10 times per day. The problem has been unchanged. The emesis has an appearance of bile. The maximum temperature recorded prior to her arrival was 102 - 102.9 F. The fever has been present for 1 to 2 days. Associated symptoms include chills, coughing, a fever and weight loss. Pertinent negatives include no abdominal pain, chest pain, diarrhea, dizziness or headaches.    Review of Systems  Constitutional:  Positive for chills, fever and weight loss.  HENT:  Negative for trouble swallowing.   Respiratory:  Positive for cough. Negative for chest tightness, shortness of breath and wheezing.   Cardiovascular:  Negative for chest pain.  Gastrointestinal:  Positive for nausea and vomiting. Negative for abdominal pain and diarrhea.  Neurological:  Positive for weakness. Negative for dizziness and headaches.  Psychiatric/Behavioral:  Negative for sleep disturbance.      Lab Results  Component Value Date   NA 139 01/21/2023   K 4.2 01/21/2023   CO2 25 01/21/2023   GLUCOSE 98 01/21/2023   BUN 14 01/21/2023   CREATININE 1.03 (H) 01/21/2023   CALCIUM 10.0 01/21/2023   EGFR 56 (L) 01/21/2023   GFRNONAA 80 01/12/2020   Lab Results  Component Value Date   CHOL 180 01/21/2023   HDL 51 01/21/2023   LDLCALC 106 (H) 01/21/2023   TRIG 128 01/21/2023   CHOLHDL 3.5 01/21/2023   Lab Results  Component Value Date   TSH 1.740 01/21/2023   Lab Results  Component Value Date   HGBA1C 6.2 (H) 01/21/2023   Lab Results  Component Value Date   WBC 3.7 01/21/2023   HGB 11.4 01/21/2023   HCT 34.5 01/21/2023   MCV 90 01/21/2023    PLT 276 01/21/2023   Lab Results  Component Value Date   ALT 15 01/21/2023   AST 18 01/21/2023   ALKPHOS 102 01/21/2023   BILITOT 0.3 01/21/2023   No results found for: MARIEN BOLLS, VD25OH   Patient Active Problem List   Diagnosis Date Noted   Bilateral carpal tunnel syndrome 11/27/2022   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   Hemorrhoids 09/04/2022   Low back pain 08/28/2022   Benign essential tremor 01/10/2021   H/O total knee replacement, left 11/08/2020   Allergy to alpha-gal 06/17/2019   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   Primary osteoarthritis of left knee 09/17/2018   Generalized anxiety disorder 11/02/2017   Weight loss, abnormal 10/11/2017   Neuropathy 07/06/2016   SUI (stress urinary incontinence, female) 11/11/2015   Incomplete emptying of bladder 11/11/2015   Recurrent UTI 11/10/2015   Arthritis of knee, left 07/01/2015   Insomnia 06/30/2015   Atrophic vaginitis 06/30/2015   Gastroesophageal reflux disease 06/30/2015   Osteopenia 05/20/2015   Anemia 09/14/2012   Constipation 08/27/2012   Essential hypertension 10/04/2011   Hyperlipidemia, mixed 10/04/2011    Allergies  Allergen Reactions   Galactose Anaphylaxis, Anxiety, Diarrhea, Itching and Shortness Of Breath   Primidone  Nausea Only   Iodinated Contrast Media Hives   Oxycodone Nausea And Vomiting and Nausea Only  Past Surgical History:  Procedure Laterality Date   ANKLE ARTHROSCOPY WITH OPEN REDUCTION INTERNAL FIXATION (ORIF)  07/2015   tri-malleolar   BUNIONECTOMY Bilateral 2001, 2005   CHOLECYSTECTOMY     COLONOSCOPY  05/2010   normal   COLONOSCOPY WITH PROPOFOL  N/A 10/12/2021   Procedure: COLONOSCOPY WITH PROPOFOL ;  Surgeon: Onita Elspeth Sharper, DO;  Location: [facility] ENDOSCOPY;  Service: Gastroenterology;  Laterality: N/A;   GANGLION CYST EXCISION Left    THORACOTOMY / DECORTICATION PARIETAL PLEURA Left 06/2017   [facility]   TOTAL KNEE ARTHROPLASTY Left  11/08/2020   VATS Sleeve lobectomy Left 09/2011   typical variant carcinoid    Social History   Tobacco Use   Smoking status: Former    Current packs/day: 0.00    Average packs/day: 0.3 packs/day for 20.0 years (5.0 ttl pk-yrs)    Types: Cigarettes    Start date: 51    Quit date: 47    Years since quitting: 40.5   Smokeless tobacco: Never   Tobacco comments:    smoking cessation materials not required  Vaping Use   Vaping status: Never Used  Substance Use Topics   Alcohol use: Not Currently    Alcohol/week: 1.0 standard drink of alcohol    Types: 1 Glasses of wine per week    Comment: stopped drinking wine 2 years   Drug use: No     Medication list has been reviewed and updated.  Current Meds  Medication Sig   Ascorbic Acid (VITAMIN C WITH ROSE HIPS) 500 MG tablet Take 500 mg by mouth daily.   buPROPion  (WELLBUTRIN  XL) 300 MG 24 hr tablet Take 1 tablet (300 mg total) by mouth daily.   Calcium Carbonate-Vit D-Min (CALCIUM 1200 PO) Take 1,200 mg by mouth.   CHOLECALCIFEROL PO Take 1 tablet by mouth daily. 5000 iu   Elderberry 500 MG CAPS Take by mouth.   EPINEPHrine  0.3 mg/0.3 mL IJ SOAJ injection INJECT 0.3 MLS (0.3 MG TOTAL) INTO THE MUSCLE AS NEEDED (ANAPHYLAXIS). FOR UP TO 1 DOSE   estradiol  (ESTRACE  VAGINAL) 0.1 MG/GM vaginal cream Apply pea sized amount of cream to external vaginal area nightly.   gentamicin ointment (GARAMYCIN) 0.1 % 3 (three) times daily.   hydrochlorothiazide  (HYDRODIURIL ) 25 MG tablet Take 1 tablet (25 mg total) by mouth daily.   hydrocortisone  (ANUSOL -HC) 2.5 % rectal cream Place 1 Application rectally 2 (two) times daily.   Melatonin 10 MG TABS Take by mouth at bedtime as needed.   Metoprolol  Tartrate 37.5 MG TABS TAKE 1 TABLET BY MOUTH EVERY DAY   Multiple Vitamin (MULTIVITAMIN WITH MINERALS) TABS tablet Take 1 tablet by mouth daily.   ondansetron  (ZOFRAN ) 4 MG tablet Take 1 tablet (4 mg total) by mouth every 8 (eight) hours as needed for  nausea or vomiting.   psyllium (METAMUCIL) 58.6 % powder Take 1 packet by mouth as needed.   simvastatin  (ZOCOR ) 20 MG tablet Take 1 tablet (20 mg total) by mouth daily.       07/23/2023   10:13 AM 07/10/2023    2:06 PM 05/01/2023    4:11 PM 04/16/2023    1:54 PM  GAD 7 : Generalized Anxiety Score  Nervous, Anxious, on Edge 0 0 0 0  Control/stop worrying 0 0 0 0  Worry too much - different things 2 0 0 0  Trouble relaxing 0 0 0 0  Restless 0 0 0 0  Easily annoyed or irritable 0 0 0 0  Afraid - awful might  happen 0 0 0 0  Total GAD 7 Score 2 0 0 0  Anxiety Difficulty Not difficult at all Not difficult at all Not difficult at all Not difficult at all       07/23/2023   10:13 AM 07/10/2023    2:06 PM 05/01/2023    4:11 PM  Depression screen PHQ 2/9  Decreased Interest 0 0 0  Down, Depressed, Hopeless 0 0 0  PHQ - 2 Score 0 0 0  Altered sleeping 0 0 0  Tired, decreased energy 0 0 0  Change in appetite 0 0 0  Feeling bad or failure about yourself  0 0 0  Trouble concentrating 0 0 0  Moving slowly or fidgety/restless 0 0 0  Suicidal thoughts 0 0 0  PHQ-9 Score 0 0 0  Difficult doing work/chores Not difficult at all Not difficult at all Not difficult at all    BP Readings from Last 3 Encounters:  08/21/23 128/68  07/23/23 124/62  07/10/23 126/70    Physical Exam Constitutional:      Appearance: She is ill-appearing.   Cardiovascular:     Rate and Rhythm: Normal rate and regular rhythm.     Pulses: Normal pulses.  Pulmonary:     Effort: Pulmonary effort is normal.     Breath sounds: Examination of the right-lower field reveals rhonchi. Rhonchi present. No wheezing.  Abdominal:     General: Abdomen is flat. Bowel sounds are decreased.     Palpations: Abdomen is soft.     Tenderness: There is no abdominal tenderness. There is no guarding or rebound.   Musculoskeletal:     Cervical back: Normal range of motion.  Lymphadenopathy:     Cervical: No cervical adenopathy.    Neurological:     General: No focal deficit present.     Mental Status: She is alert.   Psychiatric:        Attention and Perception: Attention normal.        Mood and Affect: Mood normal.     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    Leita HILARIO Adie, MD [facility] [place]

## 2023-08-23 ENCOUNTER — Telehealth: Payer: Self-pay

## 2023-08-23 NOTE — Transitions of Care (Post Inpatient/ED Visit) (Signed)
   08/23/2023  Name: Alexandra Watson MRN: [id number] DOB: May 08, 1945  Today's TOC FU Call Status: Today's TOC FU Call Status:: Successful TOC FU Call Completed TOC FU Call Complete Date: 08/23/23 Patient's Name and Date of Birth confirmed.  Transition Care Management Follow-up Telephone Call Date of Discharge: 08/22/23 Discharge Facility: Other ([facility] Facility) Name of Other ([facility]) Discharge Facility: [facility] Type of Discharge: Inpatient Admission Primary Inpatient Discharge Diagnosis:: pneumonia How have you been since you were released from the hospital?: Better Any questions or concerns?: No  Items Reviewed: Did you receive and understand the discharge instructions provided?: Yes Medications obtained,verified, and reconciled?: Yes (Medications Reviewed) Any new allergies since your discharge?: No Dietary orders reviewed?: Yes Do you have support at home?: Yes People in Home [RPT]: spouse  Medications Reviewed Today: Medications Reviewed Today   Medications were not reviewed in this encounter     Home Care and Equipment/Supplies: Were Home Health Services Ordered?: NA Any new equipment or medical supplies ordered?: NA  Functional Questionnaire: Do you need assistance with bathing/showering or dressing?: Yes Do you need assistance with meal preparation?: No Do you need assistance with eating?: No Do you have difficulty maintaining continence: No Do you need assistance with getting out of bed/getting out of a chair/moving?: Yes Do you have difficulty managing or taking your medications?: Yes  Follow up appointments reviewed: PCP Follow-up appointment confirmed?: NA Specialist Hospital Follow-up appointment confirmed?: No Reason Specialist Follow-Up Not Confirmed: Patient has Specialist Provider Number and will Call for Appointment Do you need transportation to your follow-up appointment?: No Do you understand care options if your condition(s) worsen?: Yes-patient  verbalized understanding   Julian Lemmings, LPN [facility] Nurse Health Advisor Direct Dial [phone]  SIGNATURE Julian Lemmings, LPN [facility] Nurse Health Advisor Direct Dial [phone]

## 2023-08-26 ENCOUNTER — Encounter: Payer: Self-pay | Admitting: Internal Medicine

## 2023-08-26 ENCOUNTER — Inpatient Hospital Stay: Admitting: Internal Medicine

## 2023-08-27 ENCOUNTER — Ambulatory Visit: Admitting: Internal Medicine

## 2023-08-27 ENCOUNTER — Encounter: Payer: Self-pay | Admitting: Internal Medicine

## 2023-08-27 VITALS — BP 128/72 | HR 78 | Ht 64.0 in | Wt 144.0 lb

## 2023-08-27 DIAGNOSIS — J189 Pneumonia, unspecified organism: Secondary | ICD-10-CM

## 2023-08-27 DIAGNOSIS — C7A090 Malignant carcinoid tumor of the bronchus and lung: Secondary | ICD-10-CM | POA: Diagnosis not present

## 2023-08-27 NOTE — Assessment & Plan Note (Signed)
 with liver mets liver lesion larger on recent CT she can discuss with Oncology in a few weeks

## 2023-08-27 NOTE — Progress Notes (Signed)
 Date:  08/27/2023   Name:  Alexandra Watson   DOB:  September 28, 1945   MRN:  [id number]   Chief Complaint: Pneumonia Hospital follow up.  Admitted to [facility] 08/21/23 to 08/22/23.  TOC call done on 08/23/23. She was treated with Zpak and Augmentin.  Respiratory panel was negative. No medication changes were made.  Medications - documented as of this encounter (statuses as of 08/22/2023) Reconcile with Patient's Chart Medications Medication Sig Dispense Quantity Refills Last Filled Start Date End Date Status  calcium carbonate (OS-CAL) 500 mg calcium (1,250 mg) tablet  Take 2 tablets (1,000 mg elem calcium total) by mouth.           Active  hydroCHLOROthiazide  (HYDRODIURIL ) 25 MG tablet  Take 1 tablet (25 mg total) by mouth daily.       10/17/2015   Active  metoPROLOL  tartrate (LOPRESSOR ) 37.5 mg tablet  Take 1 tablet (37.5 mg total) by mouth daily.       02/25/2015   Active  simvastatin  (ZOCOR ) 20 MG tablet          10/17/2015   Active  cholecalciferol, vitamin D3, (VITAMIN D3) 1,000 unit capsule  Take 1 capsule (25 mcg total) by mouth in the morning.           Active  buPROPion  (WELLBUTRIN  XL) 300 MG 24 hr tablet  Take 1 tablet (300 mg total) by mouth daily.           Active  estradiol  (ESTRACE ) 0.01 % (0.1 mg/gram) vaginal cream  Insert 2 g into the vagina daily.           Active  azithromycin  (ZITHROMAX ) 500 MG tablet  Indications: pneumonia Take 1 tablet (500 mg total) by mouth daily. 1 tablet    08/22/2023 3:45 PM EDT 08/23/2023   Active  amoxicillin -clavulanate (AUGMENTIN) 875-125 mg per tablet  Indications: Pneumonia of right middle lobe due to infectious organism Take 1 tablet by mouth two (2) times a day for 3 days. 6 tablet    08/22/2023 3:45 PM EDT 08/23/2023 08/26/2023 Active   CT ABDOMEN PELVIS WO CONTRAST  IMPRESSION: 1. Large right hepatic mass measuring up to 11.4 cm, new since 2017. Recommend abdominal MRI with and without contrast for further evaluation. 2. Right basilar  consolidation is partially imaged and concerning for pneumonia. 3. Large volume rectal stool without wall thickening to suggest stercoral colitis. 4. Mildly dilated extrahepatic and intrahepatic bile ducts, which may be due to reservoir effect, status post cholecystectomy. Consider correlation bilirubin.   Compare CT abd 12/2022: - Liver: Interval increase in size of low attenuating mass right hepatic  lobe measuring 6.8 x 6.1 cm (series 3 image 12), previously measuring 4.8 x  3.5 cm, No definite new liver lesions.  - Biliary and Gallbladder: Similar mild-moderate intrahepatic and  extrahepatic biliary ductal dilatation, likely secondary to reservoir  effect in the setting of prior cholecystectomy.   Chest XR: IMPRESSION: Right upper lobar pneumonia.   Review of Systems   Lab Results  Component Value Date   NA 139 01/21/2023   K 4.2 01/21/2023   CO2 25 01/21/2023   GLUCOSE 98 01/21/2023   BUN 14 01/21/2023   CREATININE 1.03 (H) 01/21/2023   CALCIUM 10.0 01/21/2023   EGFR 56 (L) 01/21/2023   GFRNONAA 80 01/12/2020   Lab Results  Component Value Date   CHOL 180 01/21/2023   HDL 51 01/21/2023   LDLCALC 106 (H) 01/21/2023   TRIG 128 01/21/2023  CHOLHDL 3.5 01/21/2023   Lab Results  Component Value Date   TSH 1.740 01/21/2023   Lab Results  Component Value Date   HGBA1C 6.2 (H) 01/21/2023   Lab Results  Component Value Date   WBC 3.7 01/21/2023   HGB 11.4 01/21/2023   HCT 34.5 01/21/2023   MCV 90 01/21/2023   PLT 276 01/21/2023   Lab Results  Component Value Date   ALT 15 01/21/2023   AST 18 01/21/2023   ALKPHOS 102 01/21/2023   BILITOT 0.3 01/21/2023   No results found for: MARIEN BOLLS, VD25OH   Patient Active Problem List   Diagnosis Date Noted   Bilateral carpal tunnel syndrome 11/27/2022   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   Hemorrhoids 09/04/2022   Low back pain 08/28/2022   Benign essential tremor 01/10/2021   H/O total  knee replacement, left 11/08/2020   Allergy to alpha-gal 06/17/2019   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   Primary osteoarthritis of left knee 09/17/2018   Generalized anxiety disorder 11/02/2017   Weight loss, abnormal 10/11/2017   Neuropathy 07/06/2016   SUI (stress urinary incontinence, female) 11/11/2015   Incomplete emptying of bladder 11/11/2015   Recurrent UTI 11/10/2015   Arthritis of knee, left 07/01/2015   Insomnia 06/30/2015   Atrophic vaginitis 06/30/2015   Gastroesophageal reflux disease 06/30/2015   Osteopenia 05/20/2015   Anemia 09/14/2012   Constipation 08/27/2012   Essential hypertension 10/04/2011   Hyperlipidemia, mixed 10/04/2011    Allergies  Allergen Reactions   Galactose Anaphylaxis, Anxiety, Diarrhea, Itching and Shortness Of Breath   Primidone  Nausea Only   Iodinated Contrast Media Hives   Oxycodone Nausea And Vomiting and Nausea Only    Past Surgical History:  Procedure Laterality Date   ANKLE ARTHROSCOPY WITH OPEN REDUCTION INTERNAL FIXATION (ORIF)  07/2015   tri-malleolar   BUNIONECTOMY Bilateral 2001, 2005   CHOLECYSTECTOMY     COLONOSCOPY  05/2010   normal   COLONOSCOPY WITH PROPOFOL  N/A 10/12/2021   Procedure: COLONOSCOPY WITH PROPOFOL ;  Surgeon: Onita Elspeth Sharper, DO;  Location: [facility] ENDOSCOPY;  Service: Gastroenterology;  Laterality: N/A;   GANGLION CYST EXCISION Left    THORACOTOMY / DECORTICATION PARIETAL PLEURA Left 06/2017   [facility]   TOTAL KNEE ARTHROPLASTY Left 11/08/2020   VATS Sleeve lobectomy Left 09/2011   typical variant carcinoid    Social History   Tobacco Use   Smoking status: Former    Current packs/day: 0.00    Average packs/day: 0.3 packs/day for 20.0 years (5.0 ttl pk-yrs)    Types: Cigarettes    Start date: 57    Quit date: 67    Years since quitting: 40.5   Smokeless tobacco: Never   Tobacco comments:    smoking cessation materials not required  Vaping Use   Vaping status: Never Used   Substance Use Topics   Alcohol use: Not Currently    Alcohol/week: 1.0 standard drink of alcohol    Types: 1 Glasses of wine per week    Comment: stopped drinking wine 2 years   Drug use: No     Medication list has been reviewed and updated.  Current Meds  Medication Sig   Ascorbic Acid (VITAMIN C WITH ROSE HIPS) 500 MG tablet Take 500 mg by mouth daily.   buPROPion  (WELLBUTRIN  XL) 300 MG 24 hr tablet Take 1 tablet (300 mg total) by mouth daily.   Calcium Carbonate-Vit D-Min (CALCIUM 1200 PO) Take 1,200 mg by mouth.   CHOLECALCIFEROL PO  Take 1 tablet by mouth daily. 5000 iu   Elderberry 500 MG CAPS Take by mouth.   EPINEPHrine  0.3 mg/0.3 mL IJ SOAJ injection INJECT 0.3 MLS (0.3 MG TOTAL) INTO THE MUSCLE AS NEEDED (ANAPHYLAXIS). FOR UP TO 1 DOSE   estradiol  (ESTRACE  VAGINAL) 0.1 MG/GM vaginal cream Apply pea sized amount of cream to external vaginal area nightly.   gentamicin ointment (GARAMYCIN) 0.1 % 3 (three) times daily.   hydrochlorothiazide  (HYDRODIURIL ) 25 MG tablet Take 1 tablet (25 mg total) by mouth daily.   hydrocortisone  (ANUSOL -HC) 2.5 % rectal cream Place 1 Application rectally 2 (two) times daily.   Melatonin 10 MG TABS Take by mouth at bedtime as needed.   Metoprolol  Tartrate 37.5 MG TABS TAKE 1 TABLET BY MOUTH EVERY DAY   Multiple Vitamin (MULTIVITAMIN WITH MINERALS) TABS tablet Take 1 tablet by mouth daily.   ondansetron  (ZOFRAN ) 4 MG tablet Take 1 tablet (4 mg total) by mouth every 8 (eight) hours as needed for nausea or vomiting.   psyllium (METAMUCIL) 58.6 % powder Take 1 packet by mouth as needed.   simvastatin  (ZOCOR ) 20 MG tablet Take 1 tablet (20 mg total) by mouth daily.       08/27/2023    2:16 PM 07/23/2023   10:13 AM 07/10/2023    2:06 PM 05/01/2023    4:11 PM  GAD 7 : Generalized Anxiety Score  Nervous, Anxious, on Edge 2 0 0 0  Control/stop worrying 0 0 0 0  Worry too much - different things 0 2 0 0  Trouble relaxing 0 0 0 0  Restless 0 0 0 0   Easily annoyed or irritable 0 0 0 0  Afraid - awful might happen 0 0 0 0  Total GAD 7 Score 2 2 0 0  Anxiety Difficulty Not difficult at all Not difficult at all Not difficult at all Not difficult at all       08/27/2023    2:16 PM 07/23/2023   10:13 AM 07/10/2023    2:06 PM  Depression screen PHQ 2/9  Decreased Interest 0 0 0  Down, Depressed, Hopeless 0 0 0  PHQ - 2 Score 0 0 0  Altered sleeping 0 0 0  Tired, decreased energy 2 0 0  Change in appetite 2 0 0  Feeling bad or failure about yourself  0 0 0  Trouble concentrating 0 0 0  Moving slowly or fidgety/restless 1 0 0  Suicidal thoughts 0 0 0  PHQ-9 Score 5 0 0  Difficult doing work/chores Not difficult at all Not difficult at all Not difficult at all    BP Readings from Last 3 Encounters:  08/27/23 128/72  08/21/23 128/68  07/23/23 124/62    Physical Exam Constitutional:      Appearance: Normal appearance.   Cardiovascular:     Rate and Rhythm: Normal rate and regular rhythm.     Heart sounds: No murmur heard. Pulmonary:     Effort: Pulmonary effort is normal.     Breath sounds: Rhonchi present. No wheezing.   Musculoskeletal:     Cervical back: Normal range of motion.     Right lower leg: No edema.     Left lower leg: No edema.  Lymphadenopathy:     Cervical: No cervical adenopathy.   Neurological:     Mental Status: She is alert.     Wt Readings from Last 3 Encounters:  08/27/23 144 lb (65.3 kg)  08/21/23 144 lb (65.3 kg)  07/23/23 146 lb (66.2 kg)    BP 128/72   Pulse 78   Ht 5' 4 (1.626 m)   Wt 144 lb (65.3 kg)   SpO2 92%   BMI 24.72 kg/m   Assessment and Plan:  Problem List Items Addressed This Visit       Unprioritized   Atypical carcinoid lung tumor (HCC) (Chronic)   with liver mets liver lesion larger on recent CT she can discuss with Oncology in a few weeks       Other Visit Diagnoses       Community acquired pneumonia of right middle lobe of lung    -  Primary    clinically improving recommend pushing fluids and Ensure/boost since not eating well. follow up as needed       No follow-ups on file.    Leita HILARIO Adie, MD [facility] [place]

## 2023-09-20 DIAGNOSIS — D3A090 Benign carcinoid tumor of the bronchus and lung: Secondary | ICD-10-CM | POA: Diagnosis not present

## 2023-09-20 DIAGNOSIS — C7B02 Secondary carcinoid tumors of liver: Secondary | ICD-10-CM | POA: Diagnosis not present

## 2023-09-20 DIAGNOSIS — C7A090 Malignant carcinoid tumor of the bronchus and lung: Secondary | ICD-10-CM | POA: Diagnosis not present

## 2023-09-20 DIAGNOSIS — C7B03 Secondary carcinoid tumors of bone: Secondary | ICD-10-CM | POA: Diagnosis not present

## 2023-09-21 ENCOUNTER — Other Ambulatory Visit: Payer: Self-pay | Admitting: Internal Medicine

## 2023-09-21 DIAGNOSIS — F411 Generalized anxiety disorder: Secondary | ICD-10-CM

## 2023-09-23 NOTE — Telephone Encounter (Signed)
 Requested Prescriptions  Pending Prescriptions Disp Refills   buPROPion  (WELLBUTRIN  XL) 300 MG 24 hr tablet [Pharmacy Med Name: BUPROPION  HCL XL 300 MG TABLET] 100 tablet 0    Sig: TAKE 1 TABLET BY MOUTH EVERY DAY     Psychiatry: Antidepressants - bupropion  Failed - 09/23/2023  2:56 PM      Failed - Cr in normal range and within 360 days    Creatinine  Date Value Ref Range Status  09/17/2011 0.87 0.60 - 1.30 mg/dL Final   Creatinine, Ser  Date Value Ref Range Status  01/21/2023 1.03 (H) 0.57 - 1.00 mg/dL Final         Passed - AST in normal range and within 360 days    AST  Date Value Ref Range Status  01/21/2023 18 0 - 40 IU/L Final   SGOT(AST)  Date Value Ref Range Status  09/17/2011 21 15 - 37 Unit/L Final         Passed - ALT in normal range and within 360 days    ALT  Date Value Ref Range Status  01/21/2023 15 0 - 32 IU/L Final   SGPT (ALT)  Date Value Ref Range Status  09/17/2011 32 U/L Final    Comment:    12-78 NOTE: NEW REFERENCE RANGE 01/19/2011          Passed - Last BP in normal range    BP Readings from Last 1 Encounters:  08/27/23 128/72         Passed - Valid encounter within last 6 months    Recent Outpatient Visits           3 weeks ago Community acquired pneumonia of right middle lobe of lung   [place] [facility] at [facility], Leita DEL, MD   1 month ago Bilious vomiting with nausea   [place] [facility] at [facility], Leita DEL, MD   2 months ago Essential hypertension   [place] [facility] at [facility], Leita DEL, MD   2 months ago Vaginal itching   [facility] at [facility], Leita DEL, MD   4 months ago Acute cystitis without hematuria   [facility] at [facility], Leita DEL, MD

## 2023-10-07 ENCOUNTER — Encounter: Payer: Self-pay | Admitting: Internal Medicine

## 2023-10-07 ENCOUNTER — Ambulatory Visit: Admitting: Internal Medicine

## 2023-10-07 ENCOUNTER — Ambulatory Visit: Payer: Self-pay

## 2023-10-07 VITALS — BP 112/78 | HR 80 | Temp 98.2°F | Ht 64.0 in | Wt 140.0 lb

## 2023-10-07 DIAGNOSIS — N3001 Acute cystitis with hematuria: Secondary | ICD-10-CM

## 2023-10-07 LAB — POCT URINALYSIS DIPSTICK
Bilirubin, UA: NEGATIVE
Glucose, UA: NEGATIVE
Ketones, UA: NEGATIVE
Nitrite, UA: POSITIVE
Protein, UA: POSITIVE — AB
Spec Grav, UA: 1.025 (ref 1.010–1.025)
Urobilinogen, UA: 0.2 U/dL
pH, UA: 6 (ref 5.0–8.0)

## 2023-10-07 MED ORDER — CEPHALEXIN 500 MG PO CAPS: 500.0000 mg | ORAL_CAPSULE | Freq: Four times a day (QID) | ORAL | 0 refills | Status: AC

## 2023-10-07 NOTE — Progress Notes (Signed)
 Date:  10/07/2023   Name:  Alexandra Watson   DOB:  January 02, 1946   MRN:  [id number]   Chief Complaint: Urinary Tract Infection (Started 3-4 weeks ago. Took AZO, and apple juice with cranberry supplements. It was getting better but never went fully away.)  Urinary Tract Infection  This is a new problem. The current episode started 1 to 4 weeks ago. The problem occurs every urination. The problem has been unchanged. The quality of the pain is described as burning. The pain is mild. There has been no fever. Pertinent negatives include no chills, nausea or vomiting.    Review of Systems  Constitutional:  Positive for fatigue. Negative for chills, fever and unexpected weight change.  Respiratory:  Negative for chest tightness, shortness of breath and wheezing.   Cardiovascular:  Negative for chest pain and leg swelling.  Gastrointestinal:  Positive for abdominal pain. Negative for constipation, diarrhea, nausea and vomiting.  Neurological:  Positive for weakness. Negative for dizziness, light-headedness and headaches.  Psychiatric/Behavioral:  Negative for dysphoric mood and sleep disturbance. The patient is not nervous/anxious.      Lab Results  Component Value Date   NA 139 01/21/2023   K 4.2 01/21/2023   CO2 25 01/21/2023   GLUCOSE 98 01/21/2023   BUN 14 01/21/2023   CREATININE 1.03 (H) 01/21/2023   CALCIUM 10.0 01/21/2023   EGFR 56 (L) 01/21/2023   GFRNONAA 80 01/12/2020   Lab Results  Component Value Date   CHOL 180 01/21/2023   HDL 51 01/21/2023   LDLCALC 106 (H) 01/21/2023   TRIG 128 01/21/2023   CHOLHDL 3.5 01/21/2023   Lab Results  Component Value Date   TSH 1.740 01/21/2023   Lab Results  Component Value Date   HGBA1C 6.2 (H) 01/21/2023   Lab Results  Component Value Date   WBC 3.7 01/21/2023   HGB 11.4 01/21/2023   HCT 34.5 01/21/2023   MCV 90 01/21/2023   PLT 276 01/21/2023   Lab Results  Component Value Date   ALT 15 01/21/2023   AST 18 01/21/2023    ALKPHOS 102 01/21/2023   BILITOT 0.3 01/21/2023   No results found for: MARIEN BOLLS, VD25OH   Patient Active Problem List   Diagnosis Date Noted   Bilateral carpal tunnel syndrome 11/27/2022   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   Hemorrhoids 09/04/2022   Low back pain 08/28/2022   Benign essential tremor 01/10/2021   H/O total knee replacement, left 11/08/2020   Allergy to alpha-gal 06/17/2019   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   Primary osteoarthritis of left knee 09/17/2018   Generalized anxiety disorder 11/02/2017   Weight loss, abnormal 10/11/2017   Neuropathy 07/06/2016   SUI (stress urinary incontinence, female) 11/11/2015   Incomplete emptying of bladder 11/11/2015   Recurrent UTI 11/10/2015   Arthritis of knee, left 07/01/2015   Insomnia 06/30/2015   Atrophic vaginitis 06/30/2015   Gastroesophageal reflux disease 06/30/2015   Osteopenia 05/20/2015   Anemia 09/14/2012   Constipation 08/27/2012   Essential hypertension 10/04/2011   Hyperlipidemia, mixed 10/04/2011    Allergies  Allergen Reactions   Galactose Anaphylaxis, Anxiety, Diarrhea, Itching and Shortness Of Breath   Primidone  Nausea Only   Iodinated Contrast Media Hives   Oxycodone Nausea And Vomiting and Nausea Only    Past Surgical History:  Procedure Laterality Date   ANKLE ARTHROSCOPY WITH OPEN REDUCTION INTERNAL FIXATION (ORIF)  07/2015   tri-malleolar   BUNIONECTOMY Bilateral 2001, 2005  CHOLECYSTECTOMY     COLONOSCOPY  05/2010   normal   COLONOSCOPY WITH PROPOFOL  N/A 10/12/2021   Procedure: COLONOSCOPY WITH PROPOFOL ;  Surgeon: Onita Elspeth Sharper, DO;  Location: [facility] ENDOSCOPY;  Service: Gastroenterology;  Laterality: N/A;   GANGLION CYST EXCISION Left    THORACOTOMY / DECORTICATION PARIETAL PLEURA Left 06/2017   [facility]   TOTAL KNEE ARTHROPLASTY Left 11/08/2020   VATS Sleeve lobectomy Left 09/2011   typical variant carcinoid    Social History    Tobacco Use   Smoking status: Former    Current packs/day: 0.00    Average packs/day: 0.3 packs/day for 20.0 years (5.0 ttl pk-yrs)    Types: Cigarettes    Start date: 79    Quit date: 73    Years since quitting: 40.6   Smokeless tobacco: Never   Tobacco comments:    smoking cessation materials not required  Vaping Use   Vaping status: Never Used  Substance Use Topics   Alcohol use: Not Currently    Alcohol/week: 1.0 standard drink of alcohol    Types: 1 Glasses of wine per week    Comment: stopped drinking wine 2 years   Drug use: No     Medication list has been reviewed and updated.  Current Meds  Medication Sig   Ascorbic Acid (VITAMIN C WITH ROSE HIPS) 500 MG tablet Take 500 mg by mouth daily.   buPROPion  (WELLBUTRIN  XL) 300 MG 24 hr tablet TAKE 1 TABLET BY MOUTH EVERY DAY   Calcium Carbonate-Vit D-Min (CALCIUM 1200 PO) Take 1,200 mg by mouth.   cephALEXin  (KEFLEX ) 500 MG capsule Take 1 capsule (500 mg total) by mouth 4 (four) times daily for 7 days.   CHOLECALCIFEROL PO Take 1 tablet by mouth daily. 5000 iu   Elderberry 500 MG CAPS Take by mouth.   EPINEPHrine  0.3 mg/0.3 mL IJ SOAJ injection INJECT 0.3 MLS (0.3 MG TOTAL) INTO THE MUSCLE AS NEEDED (ANAPHYLAXIS). FOR UP TO 1 DOSE   estradiol  (ESTRACE  VAGINAL) 0.1 MG/GM vaginal cream Apply pea sized amount of cream to external vaginal area nightly.   gentamicin ointment (GARAMYCIN) 0.1 % 3 (three) times daily.   hydrochlorothiazide  (HYDRODIURIL ) 25 MG tablet Take 1 tablet (25 mg total) by mouth daily.   hydrocortisone  (ANUSOL -HC) 2.5 % rectal cream Place 1 Application rectally 2 (two) times daily.   Melatonin 10 MG TABS Take by mouth at bedtime as needed.   Metoprolol  Tartrate 37.5 MG TABS TAKE 1 TABLET BY MOUTH EVERY DAY   Multiple Vitamin (MULTIVITAMIN WITH MINERALS) TABS tablet Take 1 tablet by mouth daily.   ondansetron  (ZOFRAN ) 4 MG tablet Take 1 tablet (4 mg total) by mouth every 8 (eight) hours as needed for  nausea or vomiting.   psyllium (METAMUCIL) 58.6 % powder Take 1 packet by mouth as needed.   simvastatin  (ZOCOR ) 20 MG tablet Take 1 tablet (20 mg total) by mouth daily.       10/07/2023    3:46 PM 08/27/2023    2:16 PM 07/23/2023   10:13 AM 07/10/2023    2:06 PM  GAD 7 : Generalized Anxiety Score  Nervous, Anxious, on Edge 0 2 0 0  Control/stop worrying 0 0 0 0  Worry too much - different things 0 0 2 0  Trouble relaxing 0 0 0 0  Restless 0 0 0 0  Easily annoyed or irritable 0 0 0 0  Afraid - awful might happen 0 0 0 0  Total GAD 7 Score  0 2 2 0  Anxiety Difficulty Not difficult at all Not difficult at all Not difficult at all Not difficult at all       10/07/2023    3:45 PM 08/27/2023    2:16 PM 07/23/2023   10:13 AM  Depression screen PHQ 2/9  Decreased Interest 0 0 0  Down, Depressed, Hopeless 0 0 0  PHQ - 2 Score 0 0 0  Altered sleeping 0 0 0  Tired, decreased energy 0 2 0  Change in appetite 0 2 0  Feeling bad or failure about yourself  0 0 0  Trouble concentrating 0 0 0  Moving slowly or fidgety/restless 0 1 0  Suicidal thoughts 0 0 0  PHQ-9 Score 0 5 0  Difficult doing work/chores Not difficult at all Not difficult at all Not difficult at all    BP Readings from Last 3 Encounters:  10/07/23 112/78  08/27/23 128/72  08/21/23 128/68    Physical Exam Vitals and nursing note reviewed.  Constitutional:      Appearance: Normal appearance. She is well-developed.  Cardiovascular:     Rate and Rhythm: Normal rate and regular rhythm.     Heart sounds: Normal heart sounds.  Pulmonary:     Effort: Pulmonary effort is normal. No respiratory distress.     Breath sounds: Normal breath sounds.  Abdominal:     General: Bowel sounds are normal.     Palpations: Abdomen is soft.     Tenderness: There is abdominal tenderness in the suprapubic area. There is no right CVA tenderness, left CVA tenderness, guarding or rebound.  Neurological:     General: No focal deficit  present.     Mental Status: She is alert and oriented to person, place, and time.    Lab Results  Component Value Date   COLORU yellow 10/07/2023   CLARITYU cloudy 10/07/2023   GLUCOSEUR Negative 10/07/2023   BILIRUBINUR neg 10/07/2023   KETONESU neg 10/07/2023   SPECGRAV 1.025 10/07/2023   RBCUR large 3+ (A) 10/07/2023   PHUR 6.0 10/07/2023   PROTEINUR Positive (A) 10/07/2023   UROBILINOGEN 0.2 10/07/2023   LEUKOCYTESUR Large (3+) (A) 10/07/2023     Wt Readings from Last 3 Encounters:  10/07/23 140 lb (63.5 kg)  08/27/23 144 lb (65.3 kg)  08/21/23 144 lb (65.3 kg)    BP 112/78   Pulse 80   Temp 98.2 F (36.8 C) (Oral)   Ht 5' 4 (1.626 m)   Wt 140 lb (63.5 kg)   SpO2 94%   BMI 24.03 kg/m   Assessment and Plan:  Problem List Items Addressed This Visit   None Visit Diagnoses       Acute cystitis with hematuria    -  Primary   continue to push fluids and take cranberry tablets insufficient urine for culture will treat with Keflex - follow up if no improvement   Relevant Medications   cephALEXin (KEFLEX) 500 MG capsule   Other Relevant Orders   POCT urinalysis dipstick (Completed)       Return in about 4 months (around 02/06/2024) for CPX.    Leita HILARIO Adie, MD [facility] [place]

## 2023-10-07 NOTE — Telephone Encounter (Signed)
 FYI Only or Action Required?: FYI only for provider.  Patient was last seen in primary care on 08/27/2023 by Justus Leita DEL, MD.  Called Nurse Triage reporting Urinary symptoms.  Symptoms began several weeks ago.  Interventions attempted: OTC medications: Azo, cranberry supplements and Rest, hydration, or home remedies.  Symptoms are: gradually worsening.  Triage Disposition: See Physician Within 24 Hours  Patient/caregiver understands and will follow disposition?: Yes  Copied from CRM #[id number]. Topic: Clinical - Red Word Triage >> Oct 07, 2023  1:50 PM Harlene ORN wrote: Red Word that prompted transfer to Nurse Triage: UTI for three weeks Reason for Disposition  Urinating more frequently than usual (i.e., frequency) OR new-onset of the feeling of an urgent need to urinate (i.e., urgency)  Answer Assessment - Initial Assessment Questions 1. SYMPTOM: What's the main symptom you're concerned about? (e.g., frequency, incontinence)     Burning, frequency 2. ONSET: When did the  burning  start?     3 weeks ago 3. PAIN: Is there any pain? If Yes, ask: How bad is it? (Scale: 1-10; mild, moderate, severe)     Burning 4. CAUSE: What do you think is causing the symptoms?     infection 5. OTHER SYMPTOMS: Do you have any other symptoms? (e.g., blood in urine, fever, flank pain, pain with urination)     None  Additional info: She has been treating suspected uti at home with otc Azo and cranberry pills but they are not helping. Urine is occasionally cloudy and then will change to clear yellow. Requesting evaluation to rule out/in uti. Scheduled acute visit today with pcp  Protocols used: Urinary Symptoms-A-AH

## 2023-10-17 ENCOUNTER — Encounter: Payer: Self-pay | Admitting: Internal Medicine

## 2023-10-17 ENCOUNTER — Ambulatory Visit (INDEPENDENT_AMBULATORY_CARE_PROVIDER_SITE_OTHER): Admitting: Internal Medicine

## 2023-10-17 VITALS — BP 124/68 | HR 92 | Ht 64.0 in | Wt 140.0 lb

## 2023-10-17 DIAGNOSIS — N3001 Acute cystitis with hematuria: Secondary | ICD-10-CM

## 2023-10-17 LAB — POCT URINALYSIS DIPSTICK
Bilirubin, UA: NEGATIVE
Glucose, UA: NEGATIVE
Ketones, UA: NEGATIVE
Nitrite, UA: POSITIVE — AB
Protein, UA: NEGATIVE
Spec Grav, UA: 1.02 (ref 1.010–1.025)
Urobilinogen, UA: 0.2 U/dL
pH, UA: 6 (ref 5.0–8.0)

## 2023-10-17 MED ORDER — SULFAMETHOXAZOLE-TRIMETHOPRIM 800-160 MG PO TABS: 1.0000 | ORAL_TABLET | Freq: Two times a day (BID) | ORAL | 0 refills | Status: AC

## 2023-10-17 NOTE — Patient Instructions (Signed)
 You can try AZO Urinary Pain Relief over the counter.

## 2023-10-17 NOTE — Progress Notes (Signed)
 Date:  10/17/2023   Name:  Alexandra Watson   DOB:  11-29-1945   MRN:  [id number]   Chief Complaint: Urinary Tract Infection (Pt c/o frequency, pressure, and cloudy urine.)  Urinary Tract Infection  This is a recurrent problem. The problem occurs every urination. The quality of the pain is described as burning. The pain is mild. There has been no fever. Associated symptoms include frequency and urgency. Pertinent negatives include no chills or hematuria. Treatments tried: recently completed a course of Keflex  for UTI.    Review of Systems  Constitutional:  Negative for chills.  Respiratory:  Negative for shortness of breath and wheezing.   Cardiovascular:  Negative for chest pain.  Gastrointestinal:  Positive for constipation. Negative for blood in stool.  Genitourinary:  Positive for frequency and urgency. Negative for hematuria.     Lab Results  Component Value Date   NA 139 01/21/2023   K 4.2 01/21/2023   CO2 25 01/21/2023   GLUCOSE 98 01/21/2023   BUN 14 01/21/2023   CREATININE 1.03 (H) 01/21/2023   CALCIUM 10.0 01/21/2023   EGFR 56 (L) 01/21/2023   GFRNONAA 80 01/12/2020   Lab Results  Component Value Date   CHOL 180 01/21/2023   HDL 51 01/21/2023   LDLCALC 106 (H) 01/21/2023   TRIG 128 01/21/2023   CHOLHDL 3.5 01/21/2023   Lab Results  Component Value Date   TSH 1.740 01/21/2023   Lab Results  Component Value Date   HGBA1C 6.2 (H) 01/21/2023   Lab Results  Component Value Date   WBC 3.7 01/21/2023   HGB 11.4 01/21/2023   HCT 34.5 01/21/2023   MCV 90 01/21/2023   PLT 276 01/21/2023   Lab Results  Component Value Date   ALT 15 01/21/2023   AST 18 01/21/2023   ALKPHOS 102 01/21/2023   BILITOT 0.3 01/21/2023   No results found for: MARIEN BOLLS, VD25OH   Patient Active Problem List   Diagnosis Date Noted   Bilateral carpal tunnel syndrome 11/27/2022   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   Hemorrhoids 09/04/2022   Low back  pain 08/28/2022   Benign essential tremor 01/10/2021   H/O total knee replacement, left 11/08/2020   Allergy to alpha-gal 06/17/2019   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   Primary osteoarthritis of left knee 09/17/2018   Generalized anxiety disorder 11/02/2017   Weight loss, abnormal 10/11/2017   Neuropathy 07/06/2016   SUI (stress urinary incontinence, female) 11/11/2015   Incomplete emptying of bladder 11/11/2015   Recurrent UTI 11/10/2015   Arthritis of knee, left 07/01/2015   Insomnia 06/30/2015   Atrophic vaginitis 06/30/2015   Gastroesophageal reflux disease 06/30/2015   Osteopenia 05/20/2015   Anemia 09/14/2012   Constipation 08/27/2012   Essential hypertension 10/04/2011   Hyperlipidemia, mixed 10/04/2011    Allergies  Allergen Reactions   Galactose Anaphylaxis, Anxiety, Diarrhea, Itching and Shortness Of Breath   Primidone  Nausea Only   Iodinated Contrast Media Hives   Oxycodone Nausea And Vomiting and Nausea Only    Past Surgical History:  Procedure Laterality Date   ANKLE ARTHROSCOPY WITH OPEN REDUCTION INTERNAL FIXATION (ORIF)  07/2015   tri-malleolar   BUNIONECTOMY Bilateral 2001, 2005   CHOLECYSTECTOMY     COLONOSCOPY  05/2010   normal   COLONOSCOPY WITH PROPOFOL  N/A 10/12/2021   Procedure: COLONOSCOPY WITH PROPOFOL ;  Surgeon: Onita Elspeth Sharper, DO;  Location: [facility] ENDOSCOPY;  Service: Gastroenterology;  Laterality: N/A;   GANGLION CYST  EXCISION Left    THORACOTOMY / DECORTICATION PARIETAL PLEURA Left 06/2017   [facility]   TOTAL KNEE ARTHROPLASTY Left 11/08/2020   VATS Sleeve lobectomy Left 09/2011   typical variant carcinoid    Social History   Tobacco Use   Smoking status: Former    Current packs/day: 0.00    Average packs/day: 0.3 packs/day for 20.0 years (5.0 ttl pk-yrs)    Types: Cigarettes    Start date: 37    Quit date: 42    Years since quitting: 40.6   Smokeless tobacco: Never   Tobacco comments:    smoking cessation  materials not required  Vaping Use   Vaping status: Never Used  Substance Use Topics   Alcohol use: Not Currently    Alcohol/week: 1.0 standard drink of alcohol    Types: 1 Glasses of wine per week    Comment: stopped drinking wine 2 years   Drug use: No     Medication list has been reviewed and updated.  Current Meds  Medication Sig   Ascorbic Acid (VITAMIN C WITH ROSE HIPS) 500 MG tablet Take 500 mg by mouth daily.   buPROPion  (WELLBUTRIN  XL) 300 MG 24 hr tablet TAKE 1 TABLET BY MOUTH EVERY DAY   Calcium Carbonate-Vit D-Min (CALCIUM 1200 PO) Take 1,200 mg by mouth.   CHOLECALCIFEROL PO Take 1 tablet by mouth daily. 5000 iu   Elderberry 500 MG CAPS Take by mouth.   EPINEPHrine  0.3 mg/0.3 mL IJ SOAJ injection INJECT 0.3 MLS (0.3 MG TOTAL) INTO THE MUSCLE AS NEEDED (ANAPHYLAXIS). FOR UP TO 1 DOSE   estradiol  (ESTRACE  VAGINAL) 0.1 MG/GM vaginal cream Apply pea sized amount of cream to external vaginal area nightly.   gentamicin ointment (GARAMYCIN) 0.1 % 3 (three) times daily.   hydrochlorothiazide  (HYDRODIURIL ) 25 MG tablet Take 1 tablet (25 mg total) by mouth daily.   hydrocortisone  (ANUSOL -HC) 2.5 % rectal cream Place 1 Application rectally 2 (two) times daily.   Melatonin 10 MG TABS Take by mouth at bedtime as needed.   Metoprolol  Tartrate 37.5 MG TABS TAKE 1 TABLET BY MOUTH EVERY DAY   Multiple Vitamin (MULTIVITAMIN WITH MINERALS) TABS tablet Take 1 tablet by mouth daily.   ondansetron  (ZOFRAN ) 4 MG tablet Take 1 tablet (4 mg total) by mouth every 8 (eight) hours as needed for nausea or vomiting.   psyllium (METAMUCIL) 58.6 % powder Take 1 packet by mouth as needed.   simvastatin  (ZOCOR ) 20 MG tablet Take 1 tablet (20 mg total) by mouth daily.   sulfamethoxazole -trimethoprim  (BACTRIM  DS) 800-160 MG tablet Take 1 tablet by mouth 2 (two) times daily for 10 days.       10/17/2023   11:23 AM 10/07/2023    3:46 PM 08/27/2023    2:16 PM 07/23/2023   10:13 AM  GAD 7 : Generalized  Anxiety Score  Nervous, Anxious, on Edge 0 0 2 0  Control/stop worrying 0 0 0 0  Worry too much - different things 0 0 0 2  Trouble relaxing 0 0 0 0  Restless 0 0 0 0  Easily annoyed or irritable 0 0 0 0  Afraid - awful might happen 0 0 0 0  Total GAD 7 Score 0 0 2 2  Anxiety Difficulty Not difficult at all Not difficult at all Not difficult at all Not difficult at all       10/17/2023   11:22 AM 10/07/2023    3:45 PM 08/27/2023    2:16 PM  Depression screen PHQ 2/9  Decreased Interest 0 0 0  Down, Depressed, Hopeless 0 0 0  PHQ - 2 Score 0 0 0  Altered sleeping 0 0 0  Tired, decreased energy 0 0 2  Change in appetite 0 0 2  Feeling bad or failure about yourself  0 0 0  Trouble concentrating 0 0 0  Moving slowly or fidgety/restless 0 0 1  Suicidal thoughts 0 0 0  PHQ-9 Score 0 0 5  Difficult doing work/chores Not difficult at all Not difficult at all Not difficult at all    BP Readings from Last 3 Encounters:  10/17/23 124/68  10/07/23 112/78  08/27/23 128/72    Physical Exam Vitals and nursing note reviewed.  Constitutional:      Appearance: She is well-developed.  Cardiovascular:     Rate and Rhythm: Normal rate and regular rhythm.     Heart sounds: Normal heart sounds.  Pulmonary:     Effort: Pulmonary effort is normal. No respiratory distress.     Breath sounds: Normal breath sounds.  Abdominal:     General: Bowel sounds are normal.     Palpations: Abdomen is soft.     Tenderness: There is abdominal tenderness in the suprapubic area. There is no right CVA tenderness, left CVA tenderness, guarding or rebound.  Neurological:     General: No focal deficit present.    Lab Results  Component Value Date   COLORU yellow 10/17/2023   CLARITYU cloudy 10/17/2023   GLUCOSEUR Negative 10/17/2023   BILIRUBINUR neg 10/17/2023   KETONESU neg 10/17/2023   SPECGRAV 1.020 10/17/2023   RBCUR moderate 2+ (A) 10/17/2023   PHUR 6.0 10/17/2023   PROTEINUR Negative  10/17/2023   UROBILINOGEN 0.2 10/17/2023   LEUKOCYTESUR Large (3+) (A) 10/17/2023     Wt Readings from Last 3 Encounters:  10/17/23 140 lb (63.5 kg)  10/07/23 140 lb (63.5 kg)  08/27/23 144 lb (65.3 kg)    BP 124/68   Pulse 92   Ht 5' 4 (1.626 m)   Wt 140 lb (63.5 kg)   SpO2 94%   BMI 24.03 kg/m   Assessment and Plan:  Problem List Items Addressed This Visit   None Visit Diagnoses       Acute cystitis with hematuria    -  Primary   recent course of Keflex  empirically will get culture today and advise when available push fluids, try AZO for discomfort   Relevant Medications   sulfamethoxazole -trimethoprim  (BACTRIM  DS) 800-160 MG tablet   Other Relevant Orders   POCT urinalysis dipstick (Completed)   Urine Culture       No follow-ups on file.    Leita HILARIO Adie, MD [facility] [place]

## 2023-10-21 ENCOUNTER — Ambulatory Visit: Payer: Self-pay | Admitting: Internal Medicine

## 2023-10-21 DIAGNOSIS — N3001 Acute cystitis with hematuria: Secondary | ICD-10-CM

## 2023-10-21 LAB — URINE CULTURE

## 2023-10-21 MED ORDER — CIPROFLOXACIN HCL 250 MG PO TABS
250.0000 mg | ORAL_TABLET | Freq: Two times a day (BID) | ORAL | 0 refills | Status: AC
Start: 2023-10-21 — End: 2023-10-28

## 2023-10-24 DIAGNOSIS — C782 Secondary malignant neoplasm of pleura: Secondary | ICD-10-CM | POA: Diagnosis not present

## 2023-10-24 DIAGNOSIS — R911 Solitary pulmonary nodule: Secondary | ICD-10-CM | POA: Diagnosis not present

## 2023-10-24 DIAGNOSIS — F17210 Nicotine dependence, cigarettes, uncomplicated: Secondary | ICD-10-CM | POA: Diagnosis not present

## 2023-10-24 DIAGNOSIS — C7989 Secondary malignant neoplasm of other specified sites: Secondary | ICD-10-CM | POA: Diagnosis not present

## 2023-10-24 DIAGNOSIS — D649 Anemia, unspecified: Secondary | ICD-10-CM | POA: Diagnosis not present

## 2023-10-24 DIAGNOSIS — C7A090 Malignant carcinoid tumor of the bronchus and lung: Secondary | ICD-10-CM | POA: Diagnosis not present

## 2023-10-24 DIAGNOSIS — R160 Hepatomegaly, not elsewhere classified: Secondary | ICD-10-CM | POA: Diagnosis not present

## 2023-12-06 ENCOUNTER — Ambulatory Visit (INDEPENDENT_AMBULATORY_CARE_PROVIDER_SITE_OTHER)

## 2023-12-06 DIAGNOSIS — Z23 Encounter for immunization: Secondary | ICD-10-CM | POA: Diagnosis not present

## 2024-01-01 ENCOUNTER — Other Ambulatory Visit: Payer: Self-pay | Admitting: Internal Medicine

## 2024-01-01 DIAGNOSIS — F411 Generalized anxiety disorder: Secondary | ICD-10-CM

## 2024-01-02 NOTE — Telephone Encounter (Signed)
 Requested Prescriptions  Pending Prescriptions Disp Refills   buPROPion  (WELLBUTRIN  XL) 300 MG 24 hr tablet [Pharmacy Med Name: BUPROPION  HCL XL 300 MG TABLET] 100 tablet 0    Sig: TAKE 1 TABLET BY MOUTH EVERY DAY     Psychiatry: Antidepressants - bupropion  Failed - 01/02/2024  2:08 PM      Failed - Cr in normal range and within 360 days    Creatinine  Date Value Ref Range Status  09/17/2011 0.87 0.60 - 1.30 mg/dL Final   Creatinine, Ser  Date Value Ref Range Status  01/21/2023 1.03 (H) 0.57 - 1.00 mg/dL Final         Passed - AST in normal range and within 360 days    AST  Date Value Ref Range Status  01/21/2023 18 0 - 40 IU/L Final   SGOT(AST)  Date Value Ref Range Status  09/17/2011 21 15 - 37 Unit/L Final         Passed - ALT in normal range and within 360 days    ALT  Date Value Ref Range Status  01/21/2023 15 0 - 32 IU/L Final   SGPT (ALT)  Date Value Ref Range Status  09/17/2011 32 U/L Final    Comment:    12-78 NOTE: NEW REFERENCE RANGE 01/19/2011          Passed - Last BP in normal range    BP Readings from Last 1 Encounters:  10/17/23 124/68         Passed - Valid encounter within last 6 months    Recent Outpatient Visits           2 months ago Acute cystitis with hematuria   [place] [facility], Leita DEL, MD   2 months ago Acute cystitis with hematuria   [place] [facility], Leita DEL, MD   4 months ago Community acquired pneumonia of right middle lobe of lung   [place] [facility] at [facility], Leita DEL, MD   4 months ago Bilious vomiting with nausea   [facility] at [facility], Leita DEL, MD   5 months ago Essential hypertension   [facility] at [facility], Leita DEL, MD       Future Appointments             In 3  weeks Adie, Leita DEL, MD [facility] at [facility], [phone] [place]

## 2024-01-16 DIAGNOSIS — Z902 Acquired absence of lung [part of]: Secondary | ICD-10-CM | POA: Diagnosis not present

## 2024-01-16 DIAGNOSIS — K5900 Constipation, unspecified: Secondary | ICD-10-CM | POA: Diagnosis not present

## 2024-01-16 DIAGNOSIS — D638 Anemia in other chronic diseases classified elsewhere: Secondary | ICD-10-CM | POA: Diagnosis not present

## 2024-01-16 DIAGNOSIS — C787 Secondary malignant neoplasm of liver and intrahepatic bile duct: Secondary | ICD-10-CM | POA: Diagnosis not present

## 2024-01-16 DIAGNOSIS — C7A090 Malignant carcinoid tumor of the bronchus and lung: Secondary | ICD-10-CM | POA: Diagnosis not present

## 2024-01-16 DIAGNOSIS — Z9049 Acquired absence of other specified parts of digestive tract: Secondary | ICD-10-CM | POA: Diagnosis not present

## 2024-01-16 DIAGNOSIS — C782 Secondary malignant neoplasm of pleura: Secondary | ICD-10-CM | POA: Diagnosis not present

## 2024-01-16 DIAGNOSIS — F17210 Nicotine dependence, cigarettes, uncomplicated: Secondary | ICD-10-CM | POA: Diagnosis not present

## 2024-01-16 LAB — BASIC METABOLIC PANEL WITH GFR
BUN: 16 (ref 4–21)
CO2: 30 — AB (ref 13–22)
Chloride: 103 (ref 99–108)
Creatinine: 0.9 (ref 0.5–1.1)
Glucose: 100
Potassium: 4.2 meq/L (ref 3.5–5.1)
Sodium: 138 (ref 137–147)

## 2024-01-16 LAB — HEPATIC FUNCTION PANEL
ALT: 17 U/L (ref 7–35)
AST: 22 (ref 13–35)
Alkaline Phosphatase: 145 — AB (ref 25–125)
Bilirubin, Total: 0.5

## 2024-01-16 LAB — CBC AND DIFFERENTIAL
HCT: 30 — AB (ref 36–46)
Hemoglobin: 9.6 — AB (ref 12.0–16.0)
Platelets: 141 K/uL — AB (ref 150–400)
WBC: 3.7

## 2024-01-16 LAB — COMPREHENSIVE METABOLIC PANEL WITH GFR
Calcium: 9.3 (ref 8.7–10.7)
eGFR: 65

## 2024-01-27 ENCOUNTER — Ambulatory Visit: Admitting: Internal Medicine

## 2024-01-27 ENCOUNTER — Encounter: Payer: Self-pay | Admitting: Internal Medicine

## 2024-01-27 VITALS — BP 112/74 | HR 72 | Ht 64.0 in | Wt 140.0 lb

## 2024-01-27 DIAGNOSIS — E782 Mixed hyperlipidemia: Secondary | ICD-10-CM

## 2024-01-27 DIAGNOSIS — I10 Essential (primary) hypertension: Secondary | ICD-10-CM

## 2024-01-27 DIAGNOSIS — Z1231 Encounter for screening mammogram for malignant neoplasm of breast: Secondary | ICD-10-CM

## 2024-01-27 DIAGNOSIS — Z91018 Allergy to other foods: Secondary | ICD-10-CM

## 2024-01-27 DIAGNOSIS — F411 Generalized anxiety disorder: Secondary | ICD-10-CM

## 2024-01-27 DIAGNOSIS — Z0000 Encounter for general adult medical examination without abnormal findings: Secondary | ICD-10-CM

## 2024-01-27 DIAGNOSIS — D508 Other iron deficiency anemias: Secondary | ICD-10-CM

## 2024-01-27 DIAGNOSIS — C7A090 Malignant carcinoid tumor of the bronchus and lung: Secondary | ICD-10-CM | POA: Diagnosis not present

## 2024-01-27 MED ORDER — HYDROCHLOROTHIAZIDE 25 MG PO TABS: 25.0000 mg | ORAL_TABLET | Freq: Every day | ORAL | 1 refills | Status: AC

## 2024-01-27 MED ORDER — SIMVASTATIN 20 MG PO TABS: 20.0000 mg | ORAL_TABLET | Freq: Every day | ORAL | 1 refills | Status: AC

## 2024-01-27 NOTE — Assessment & Plan Note (Signed)
 Recent Oncology scan showed the liver lesion has enlarged. They recommend Chemotherapy but she is hesitant to do this. Will see Oncology again in March.

## 2024-01-27 NOTE — Assessment & Plan Note (Signed)
 LDL is  Lab Results  Component Value Date   LDLCALC 106 (H) 01/21/2023    Current medication regimen is simvastatin . Goal LDL is < 70.

## 2024-01-27 NOTE — Patient Instructions (Signed)
 Call [facility] to schedule your mammogram at [phone].

## 2024-01-27 NOTE — Assessment & Plan Note (Signed)
 Well controlled blood pressure today. Current regimen is metoprolol  and hydrochlorothiazide . No medication side effects noted.

## 2024-01-27 NOTE — Progress Notes (Signed)
 Date:  01/27/2024   Name:  Alexandra Watson   DOB:  [birth year]   MRN:  [id number]   Chief Complaint: Annual Exam Alexandra Watson is a 78 y.o. female who presents today for her Complete Annual Exam. She feels fairly well. She reports exercising - some. She reports she is sleeping well. Breast complaints - none.  Health Maintenance  Topic Date Due   DTaP/Tdap/Td vaccine (2 - Td or Tdap) 06/11/2019   COVID-19 Vaccine (5 - 2025-26 season) 10/28/2023   Medicare Annual Wellness Visit  01/10/2024   Breast Cancer Screening  03/19/2024   Pneumococcal Vaccine for age over 2  Completed   Flu Shot  Completed   Osteoporosis screening with Bone Density Scan  Completed   Zoster (Shingles) Vaccine  Completed   Hepatitis C Screening  Addressed   Meningitis B Vaccine  Aged Out   Colon Cancer Screening  Discontinued    Hypertension This is a chronic problem. Associated symptoms include anxiety. Pertinent negatives include no chest pain, headaches, palpitations or shortness of breath.  Hyperlipidemia Pertinent negatives include no chest pain, myalgias or shortness of breath.  Anxiety Presents for follow-up visit. Symptoms include nervous/anxious behavior. Patient reports no chest pain, dizziness, palpitations or shortness of breath. Symptoms occur occasionally. The quality of sleep is good.   Compliance with medications is 76-100%.  Lung cancer - oncology monitoring a liver lesion - it had enlarged by 1 cm at last check but she is not eager to take chemotherapy.  She will see them again in March and plans to wait for the next scan to decide.  Review of Systems  Constitutional:  Negative for fatigue and unexpected weight change.  HENT:  Negative for trouble swallowing.   Eyes:  Negative for visual disturbance.  Respiratory:  Negative for cough, chest tightness, shortness of breath and wheezing.   Cardiovascular:  Negative for chest pain, palpitations and leg swelling.  Gastrointestinal:   Positive for constipation. Negative for abdominal pain and diarrhea.  Genitourinary:  Negative for dysuria and urgency.  Musculoskeletal:  Negative for arthralgias and myalgias.  Neurological:  Negative for dizziness, weakness, light-headedness and headaches.  Psychiatric/Behavioral:  Positive for dysphoric mood. Negative for sleep disturbance. The patient is nervous/anxious.      Lab Results  Component Value Date   NA 138 01/16/2024   K 4.2 01/16/2024   CO2 30 (A) 01/16/2024   GLUCOSE 98 01/21/2023   BUN 16 01/16/2024   CREATININE 0.9 01/16/2024   CALCIUM 9.3 01/16/2024   EGFR 65 01/16/2024   GFRNONAA 80 01/12/2020   Lab Results  Component Value Date   CHOL 180 01/21/2023   HDL 51 01/21/2023   LDLCALC 106 (H) 01/21/2023   TRIG 128 01/21/2023   CHOLHDL 3.5 01/21/2023   Lab Results  Component Value Date   TSH 1.740 01/21/2023   Lab Results  Component Value Date   HGBA1C 6.2 (H) 01/21/2023   Lab Results  Component Value Date   WBC 3.7 01/16/2024   HGB 9.6 (A) 01/16/2024   HCT 30 (A) 01/16/2024   MCV 90 01/21/2023   PLT 141 (A) 01/16/2024   Lab Results  Component Value Date   ALT 17 01/16/2024   AST 22 01/16/2024   ALKPHOS 145 (A) 01/16/2024   BILITOT 0.3 01/21/2023   No results found for: MARIEN BOLLS, VD25OH   Patient Active Problem List   Diagnosis Date Noted   Bilateral carpal tunnel syndrome 11/27/2022   Ulnar  nerve entrapment at elbow 11/27/2022   Benign essential tremor 01/10/2021   H/O total knee replacement, left 11/08/2020   Allergy to alpha-gal 06/17/2019   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   Primary osteoarthritis of left knee 09/17/2018   Generalized anxiety disorder 11/02/2017   Neuropathy 07/06/2016   SUI (stress urinary incontinence, female) 11/11/2015   Arthritis of knee, left 07/01/2015   Insomnia 06/30/2015   Atrophic vaginitis 06/30/2015   Gastroesophageal reflux disease 06/30/2015   Osteopenia 05/20/2015    Anemia 09/14/2012   Constipation 08/27/2012   Essential hypertension 10/04/2011   Hyperlipidemia, mixed 10/04/2011    Allergies  Allergen Reactions   Galactose Anaphylaxis, Anxiety, Diarrhea, Itching and Shortness Of Breath   Primidone  Nausea Only   Iodinated Contrast Media Hives   Oxycodone Nausea And Vomiting and Nausea Only    Past Surgical History:  Procedure Laterality Date   ANKLE ARTHROSCOPY WITH OPEN REDUCTION INTERNAL FIXATION (ORIF)  07/2015   tri-malleolar   BUNIONECTOMY Bilateral 2001, 2005   CHOLECYSTECTOMY     COLONOSCOPY  05/2010   normal   COLONOSCOPY WITH PROPOFOL  N/A 10/12/2021   Procedure: COLONOSCOPY WITH PROPOFOL ;  Surgeon: Onita Elspeth Sharper, DO;  Location: [facility] ENDOSCOPY;  Service: Gastroenterology;  Laterality: N/A;   GANGLION CYST EXCISION Left    THORACOTOMY / DECORTICATION PARIETAL PLEURA Left 06/2017   [facility]   TOTAL KNEE ARTHROPLASTY Left 11/08/2020   VATS Sleeve lobectomy Left 09/2011   typical variant carcinoid    Social History   Tobacco Use   Smoking status: Former    Current packs/day: 0.00    Average packs/day: 0.3 packs/day for 20.0 years (5.0 ttl pk-yrs)    Types: Cigarettes    Start date: 40    Quit date: 34    Years since quitting: 40.9   Smokeless tobacco: Never   Tobacco comments:    smoking cessation materials not required  Vaping Use   Vaping status: Never Used  Substance Use Topics   Alcohol use: Not Currently    Alcohol/week: 1.0 standard drink of alcohol    Types: 1 Glasses of wine per week    Comment: stopped drinking wine 2 years   Drug use: No     Medication list has been reviewed and updated.  Current Meds  Medication Sig   Ascorbic Acid (VITAMIN C WITH ROSE HIPS) 500 MG tablet Take 500 mg by mouth daily.   buPROPion  (WELLBUTRIN  XL) 300 MG 24 hr tablet TAKE 1 TABLET BY MOUTH EVERY DAY   Calcium Carbonate-Vit D-Min (CALCIUM 1200 PO) Take 1,200 mg by mouth.   CHOLECALCIFEROL PO Take 1 tablet by mouth  daily. 5000 iu   EPINEPHrine  0.3 mg/0.3 mL IJ SOAJ injection INJECT 0.3 MLS (0.3 MG TOTAL) INTO THE MUSCLE AS NEEDED (ANAPHYLAXIS). FOR UP TO 1 DOSE   estradiol  (ESTRACE  VAGINAL) 0.1 MG/GM vaginal cream Apply pea sized amount of cream to external vaginal area nightly.   ferrous sulfate 325 (65 FE) MG tablet Take 325 mg by mouth 3 (three) times a week.   gentamicin ointment (GARAMYCIN) 0.1 % 3 (three) times daily.   Melatonin 10 MG TABS Take by mouth at bedtime as needed.   Metoprolol  Tartrate 37.5 MG TABS TAKE 1 TABLET BY MOUTH EVERY DAY   Multiple Vitamin (MULTIVITAMIN WITH MINERALS) TABS tablet Take 1 tablet by mouth daily.   ondansetron  (ZOFRAN ) 4 MG tablet Take 1 tablet (4 mg total) by mouth every 8 (eight) hours as needed for nausea or vomiting.  psyllium (METAMUCIL) 58.6 % powder Take 1 packet by mouth as needed.   [DISCONTINUED] azithromycin  (ZITHROMAX ) 500 MG tablet Take 500 mg by mouth daily.   [DISCONTINUED] hydrochlorothiazide  (HYDRODIURIL ) 25 MG tablet Take 1 tablet (25 mg total) by mouth daily.   [DISCONTINUED] simvastatin  (ZOCOR ) 20 MG tablet Take 1 tablet (20 mg total) by mouth daily.       01/27/2024    9:13 AM 10/17/2023   11:23 AM 10/07/2023    3:46 PM 08/27/2023    2:16 PM  GAD 7 : Generalized Anxiety Score  Nervous, Anxious, on Edge 2 0 0 2  Control/stop worrying 2 0 0 0  Worry too much - different things 2 0 0 0  Trouble relaxing 0 0 0 0  Restless 2 0 0 0  Easily annoyed or irritable 0 0 0 0  Afraid - awful might happen 2 0 0 0  Total GAD 7 Score 10 0 0 2  Anxiety Difficulty Very difficult Not difficult at all Not difficult at all Not difficult at all       01/27/2024    9:13 AM 10/17/2023   11:22 AM 10/07/2023    3:45 PM  Depression screen PHQ 2/9  Decreased Interest 2 0 0  Down, Depressed, Hopeless 2 0 0  PHQ - 2 Score 4 0 0  Altered sleeping 0 0 0  Tired, decreased energy 0 0 0  Change in appetite 0 0 0  Feeling bad or failure about yourself  0 0 0   Trouble concentrating 0 0 0  Moving slowly or fidgety/restless 0 0 0  Suicidal thoughts 0 0 0  PHQ-9 Score 4 0  0   Difficult doing work/chores Somewhat difficult Not difficult at all Not difficult at all     Data saved with a previous flowsheet row definition    BP Readings from Last 3 Encounters:  01/27/24 112/74  10/17/23 124/68  10/07/23 112/78    Physical Exam Vitals and nursing note reviewed.  Constitutional:      General: She is not in acute distress.    Appearance: She is well-developed.  HENT:     Head: Normocephalic and atraumatic.     Right Ear: Tympanic membrane and ear canal normal.     Left Ear: Tympanic membrane and ear canal normal.     Nose:     Right Sinus: No maxillary sinus tenderness.     Left Sinus: No maxillary sinus tenderness.  Eyes:     General: No scleral icterus.       Right eye: No discharge.        Left eye: No discharge.     Conjunctiva/sclera: Conjunctivae normal.  Neck:     Thyroid : No thyromegaly.     Vascular: No carotid bruit.  Cardiovascular:     Rate and Rhythm: Normal rate and regular rhythm.     Pulses: Normal pulses.     Heart sounds: Normal heart sounds.  Pulmonary:     Effort: Pulmonary effort is normal. No respiratory distress.     Breath sounds: No wheezing.  Abdominal:     General: Bowel sounds are normal.     Palpations: Abdomen is soft.     Tenderness: There is no abdominal tenderness.  Musculoskeletal:     Cervical back: Normal range of motion. No erythema.     Right lower leg: No edema.     Left lower leg: No edema.  Lymphadenopathy:     Cervical: No  cervical adenopathy.  Skin:    General: Skin is warm and dry.     Findings: No rash.  Neurological:     General: No focal deficit present.     Mental Status: She is alert and oriented to person, place, and time.     Cranial Nerves: No cranial nerve deficit.     Sensory: No sensory deficit.     Deep Tendon Reflexes: Reflexes are normal and symmetric.   Psychiatric:        Attention and Perception: Attention normal.        Mood and Affect: Mood normal.     Wt Readings from Last 3 Encounters:  01/27/24 140 lb (63.5 kg)  10/17/23 140 lb (63.5 kg)  10/07/23 140 lb (63.5 kg)    BP 112/74   Pulse 72   Ht 5' 4 (1.626 m)   Wt 140 lb (63.5 kg)   SpO2 96%   BMI 24.03 kg/m   Assessment and Plan:  Problem List Items Addressed This Visit       Unprioritized   Essential hypertension (Chronic)   Well controlled blood pressure today. Current regimen is metoprolol  and hydrochlorothiazide . No medication side effects noted.        Relevant Medications   hydrochlorothiazide  (HYDRODIURIL ) 25 MG tablet   simvastatin  (ZOCOR ) 20 MG tablet   Other Relevant Orders   TSH   Urinalysis, Routine w reflex microscopic   Hyperlipidemia, mixed (Chronic)   LDL is  Lab Results  Component Value Date   LDLCALC 106 (H) 01/21/2023    Current medication regimen is simvastatin . Goal LDL is < 70.       Relevant Medications   hydrochlorothiazide  (HYDRODIURIL ) 25 MG tablet   simvastatin  (ZOCOR ) 20 MG tablet   Other Relevant Orders   Lipid panel   Anemia   Relevant Medications   ferrous sulfate 325 (65 FE) MG tablet   Other Relevant Orders   Iron, TIBC and Ferritin Panel   Generalized anxiety disorder (Chronic)   Doing well on Bupropion .  Some mild mood changes due her cancer diagnosis.      Atypical carcinoid lung tumor (HCC) (Chronic)   Recent Oncology scan showed the liver lesion has enlarged. They recommend Chemotherapy but she is hesitant to do this. Will see Oncology again in March.      Allergy to alpha-gal (Chronic)   Relevant Orders   Alpha-Gal Panel   Other Visit Diagnoses       Annual physical exam    -  Primary   continue healthy diet, activity as tolerated up to date on screenings; declines Flu and RSV vaccines     Encounter for screening mammogram for breast cancer       Relevant Orders   MM 3D SCREENING  MAMMOGRAM BILATERAL BREAST      Planning to change PCP to [facility] [place]. No follow-ups on file.    Leita HILARIO Adie, MD [facility] [place]

## 2024-01-27 NOTE — Assessment & Plan Note (Addendum)
 Doing well on Bupropion .  Some mild mood changes due her cancer diagnosis.

## 2024-01-30 ENCOUNTER — Ambulatory Visit: Payer: Self-pay | Admitting: Internal Medicine

## 2024-01-30 LAB — LIPID PANEL
Chol/HDL Ratio: 4.5 ratio — ABNORMAL HIGH (ref 0.0–4.4)
Cholesterol, Total: 202 mg/dL — ABNORMAL HIGH (ref 100–199)
HDL: 45 mg/dL (ref >39)
LDL Chol Calc (NIH): 129 mg/dL — ABNORMAL HIGH (ref 0–99)
Triglycerides: 155 mg/dL — ABNORMAL HIGH (ref 0–149)
VLDL Cholesterol Cal: 28 mg/dL (ref 5–40)

## 2024-01-30 LAB — IRON,TIBC AND FERRITIN PANEL
Ferritin: 251 ng/mL — ABNORMAL HIGH (ref 15–150)
Iron Saturation: 30 % (ref 15–55)
Iron: 55 ug/dL (ref 27–139)
Total Iron Binding Capacity: 185 ug/dL — ABNORMAL LOW (ref 250–450)
UIBC: 130 ug/dL (ref 118–369)

## 2024-01-30 LAB — URINALYSIS, ROUTINE W REFLEX MICROSCOPIC
Bilirubin, UA: NEGATIVE
Glucose, UA: NEGATIVE
Ketones, UA: NEGATIVE
Nitrite, UA: POSITIVE — AB
Protein,UA: NEGATIVE
Specific Gravity, UA: 1.01 (ref 1.005–1.030)
Urobilinogen, Ur: 0.2 mg/dL (ref 0.2–1.0)
pH, UA: 7.5 (ref 5.0–7.5)

## 2024-01-30 LAB — ALPHA-GAL PANEL
Allergen Lamb IgE: <0.1 kU/L
Beef IgE: 0.1 kU/L — AB
IgE (Immunoglobulin E), Serum: 8 [IU]/mL (ref 6–495)
O215-IgE Alpha-Gal: 0.2 kU/L — AB
Pork IgE: <0.1 kU/L

## 2024-01-30 LAB — MICROSCOPIC EXAMINATION
Bacteria, UA: NONE SEEN
Casts: NONE SEEN /LPF
WBC, UA: >30 /HPF — AB (ref 0–5)

## 2024-01-30 LAB — TSH: TSH: 2.14 u[IU]/mL (ref 0.450–4.500)

## 2024-02-06 ENCOUNTER — Ambulatory Visit: Payer: Self-pay

## 2024-02-06 VITALS — Ht 64.0 in | Wt 138.0 lb

## 2024-02-06 DIAGNOSIS — Z0000 Encounter for general adult medical examination without abnormal findings: Secondary | ICD-10-CM

## 2024-02-06 NOTE — Progress Notes (Signed)
 Chief Complaint  Patient presents with   Medicare Wellness     Subjective:   Alexandra Watson is a 78 y.o. female who presents for a Medicare Annual Wellness Visit.  Visit info / Clinical Intake: Medicare Wellness Visit Type:: Subsequent Annual Wellness Visit Persons participating in visit and providing information:: patient Medicare Wellness Visit Mode:: Telephone If telephone:: video declined Since this visit was completed virtually, some vitals may be partially provided or unavailable. Missing vitals are due to the limitations of the virtual format.: Documented vitals are patient reported If Telephone or Video please confirm:: I connected with patient using audio/video enable telemedicine. I verified patient identity with two identifiers, discussed telehealth limitations, and patient agreed to proceed. Patient Location:: home Provider Location:: home office Interpreter Needed?: No Pre-visit prep was completed: yes AWV questionnaire completed by patient prior to visit?: no Living arrangements:: lives with spouse/significant other Patient's Overall Health Status Rating: good Typical amount of pain: none Does pain affect daily life?: no Are you currently prescribed opioids?: no  Dietary Habits and Nutritional Risks How many meals a day?: 2 Eats fruit and vegetables daily?: yes Most meals are obtained by: preparing own meals In the last 2 weeks, have you had any of the following?: none Diabetic:: no  Functional Status Activities of Daily Living (to include ambulation/medication): Independent Ambulation: Independent with device- listed below Home Assistive Devices/Equipment: Eyeglasses (glasses for reading) Medication Administration: Independent Home Management (perform basic housework or laundry): Independent Manage your own finances?: yes Primary transportation is: driving Concerns about vision?: no *vision screening is required for WTM* Concerns about hearing?:  no  Fall Screening Falls in the past year?: 0 Number of falls in past year: 0 Was there an injury with Fall?: 0 Fall Risk Category Calculator: 0 Patient Fall Risk Level: Low Fall Risk  Fall Risk Patient at Risk for Falls Due to: No Fall Risks Fall risk Follow up: Falls evaluation completed  Home and Transportation Safety: All rugs have non-skid backing?: yes All stairs or steps have railings?: yes Grab bars in the bathtub or shower?: yes Have non-skid surface in bathtub or shower?: yes Good home lighting?: yes Regular seat belt use?: yes Hospital stays in the last year:: (!) yes How many hospital stays:: 1 Reason: pneumonia  Cognitive Assessment Difficulty concentrating, remembering, or making decisions? : no Will 6CIT or Mini Cog be Completed: yes What year is it?: 0 points What month is it?: 0 points Give patient an address phrase to remember (5 components): [address] [place] About what time is it?: 0 points Count backwards from 20 to 1: 0 points Say the months of the year in reverse: 0 points Repeat the address phrase from earlier: 0 points 6 CIT Score: 0 points  Advance Directives (For Healthcare) Does Patient Have a Medical Advance Directive?: No Would patient like information on creating a medical advance directive?: No - Patient declined  Reviewed/Updated  Reviewed/Updated: Reviewed All (Medical, Surgical, Family, Medications, Allergies, Care Teams, Patient Goals)    Allergies (verified) Galactose, Primidone , Iodinated contrast media, and Oxycodone   Current Medications (verified) Outpatient Encounter Medications as of 02/06/2024  Medication Sig   Ascorbic Acid (VITAMIN C WITH ROSE HIPS) 500 MG tablet Take 500 mg by mouth daily. (Patient taking differently: Take 500 mg by mouth daily. Taking 2 times per week)   buPROPion  (WELLBUTRIN  XL) 300 MG 24 hr tablet TAKE 1 TABLET BY MOUTH EVERY DAY   Calcium Carbonate-Vit D-Min (CALCIUM 1200 PO) Take 1,200  mg  by mouth. (Patient taking differently: Take 1,200 mg by mouth. Taking 2-3 times per week)   CHOLECALCIFEROL PO Take 1 tablet by mouth daily. 5000 iu   EPINEPHrine  0.3 mg/0.3 mL IJ SOAJ injection INJECT 0.3 MLS (0.3 MG TOTAL) INTO THE MUSCLE AS NEEDED (ANAPHYLAXIS). FOR UP TO 1 DOSE   ferrous sulfate 325 (65 FE) MG tablet Take 325 mg by mouth 3 (three) times a week.   hydrochlorothiazide  (HYDRODIURIL ) 25 MG tablet Take 1 tablet (25 mg total) by mouth daily.   Melatonin 10 MG TABS Take by mouth at bedtime as needed.   Metoprolol  Tartrate 37.5 MG TABS TAKE 1 TABLET BY MOUTH EVERY DAY   Multiple Vitamin (MULTIVITAMIN WITH MINERALS) TABS tablet Take 1 tablet by mouth daily. (Patient taking differently: Take 1 tablet by mouth daily. 3 times per week)   psyllium (METAMUCIL) 58.6 % powder Take 1 packet by mouth as needed.   simvastatin  (ZOCOR ) 20 MG tablet Take 1 tablet (20 mg total) by mouth daily.   estradiol  (ESTRACE  VAGINAL) 0.1 MG/GM vaginal cream Apply pea sized amount of cream to external vaginal area nightly. (Patient not taking: Reported on 02/06/2024)   gentamicin ointment (GARAMYCIN) 0.1 % 3 (three) times daily. (Patient not taking: Reported on 02/06/2024)   ondansetron  (ZOFRAN ) 4 MG tablet Take 1 tablet (4 mg total) by mouth every 8 (eight) hours as needed for nausea or vomiting. (Patient not taking: Reported on 02/06/2024)   No facility-administered encounter medications on file as of 02/06/2024.    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   Past Surgical History:  Procedure Laterality Date   ANKLE ARTHROSCOPY WITH OPEN REDUCTION INTERNAL FIXATION (ORIF)  07/2015   tri-malleolar   BUNIONECTOMY Bilateral 2001, 2005    CHOLECYSTECTOMY     COLONOSCOPY  05/2010   normal   COLONOSCOPY WITH PROPOFOL  N/A 10/12/2021   Procedure: COLONOSCOPY WITH PROPOFOL ;  Surgeon: Onita Elspeth Sharper, DO;  Location: [facility] ENDOSCOPY;  Service: Gastroenterology;  Laterality: N/A;   GANGLION CYST EXCISION Left    THORACOTOMY / DECORTICATION PARIETAL PLEURA Left 06/2017   [facility]   TOTAL KNEE ARTHROPLASTY Left 11/08/2020   VATS Sleeve lobectomy Left 09/2011   typical variant carcinoid   Family History  Problem Relation Age of Onset   Stroke Mother    Heart disease Mother    Heart attack Father    Heart disease Father    CAD Brother    Heart disease Brother    Heart attack Brother    Heart disease Brother    Breast cancer Neg Hx    Social History   Occupational History   Occupation: Retired  Tobacco Use   Smoking status: Former    Current packs/day: 0.00    Average packs/day: 0.3 packs/day for 20.0 years (5.0 ttl pk-yrs)    Types: Cigarettes    Start date: 71    Quit date: 1985    Years since quitting: 40.9   Smokeless tobacco: Never   Tobacco comments:    smoking cessation materials not required  Vaping Use   Vaping status: Never Used  Substance and Sexual Activity   Alcohol use: Not Currently  Comment: stopped drinking wine 2 years   Drug use: No   Sexual activity: Not Currently   Tobacco Counseling Counseling given: Not Answered Tobacco comments: smoking cessation materials not required  SDOH Screenings   Food Insecurity: No Food Insecurity (02/06/2024)  Housing: Low Risk (02/06/2024)  Transportation Needs: No Transportation Needs (02/06/2024)  Utilities: Not At Risk (02/06/2024)  Alcohol Screen: Low Risk (01/10/2023)  Depression (PHQ2-9): Low Risk (02/06/2024)  Financial Resource Strain: Low Risk (08/22/2023)   Received from [facility]  Physical Activity: Inactive (02/06/2024)  Social Connections: Socially Integrated (02/06/2024)  Stress: No Stress Concern Present (02/06/2024)  Tobacco  Use: Medium Risk (02/06/2024)  Health Literacy: Adequate Health Literacy (02/06/2024)   See flowsheets for full screening details  Depression Screen PHQ 2 & 9 Depression Scale- Over the past 2 weeks, how often have you been bothered by any of the following problems? Little interest or pleasure in doing things: 0 Feeling down, depressed, or hopeless (PHQ Adolescent also includes...irritable): 0 PHQ-2 Total Score: 0 Trouble falling or staying asleep, or sleeping too much: 0 Feeling tired or having little energy: 1 Poor appetite or overeating (PHQ Adolescent also includes...weight loss): 0 Feeling bad about yourself - or that you are a failure or have let yourself or your family down: 0 Trouble concentrating on things, such as reading the newspaper or watching television (PHQ Adolescent also includes...like school work): 0 Moving or speaking so slowly that other people could have noticed. Or the opposite - being so fidgety or restless that you have been moving around a lot more than usual: 0 Thoughts that you would be better off dead, or of hurting yourself in some way: 0 PHQ-9 Total Score: 1 If you checked off any problems, how difficult have these problems made it for you to do your work, take care of things at home, or get along with other people?: Not difficult at all  Depression Treatment Depression Interventions/Treatment : EYV7-0 Score <4 Follow-up Not Indicated; Currently on Treatment     Goals Addressed               This Visit's Progress     Patient Stated (pt-stated)   Not on track     Exercise and walk more             Objective:    Today's Vitals   02/06/24 1313  Weight: 138 lb (62.6 kg)  Height: 5' 4 (1.626 m)   Body mass index is 23.69 kg/m.  Hearing/Vision screen Hearing Screening - Comments:: Denies hearing loss  Vision Screening - Comments:: UTD w/ Dr. Trey Benfield [place] Immunizations and Health Maintenance Health Maintenance  Topic Date  Due   DTaP/Tdap/Td (2 - Td or Tdap) 06/11/2019   COVID-19 Vaccine (5 - 2025-26 season) 10/28/2023   Mammogram  03/19/2024   Medicare Annual Wellness (AWV)  02/05/2025   Bone Density Scan  03/19/2028   Pneumococcal Vaccine: 50+ Years  Completed   Influenza Vaccine  Completed   Zoster Vaccines- Shingrix  Completed   Hepatitis C Screening  Addressed   Meningococcal B Vaccine  Aged Out   Colonoscopy  Discontinued        Assessment/Plan:  This is a routine wellness examination for Alexandra Watson.  Patient Care Team: Justus Leita DEL, MD as PCP - General (Internal Medicine) Orman [place] as Physician Assistant (Dermatology) Barnett Smalls, MD as Referring Physician (Hematology and Oncology) [facility], [facility])  I have personally reviewed and noted the  following in the patients chart:   Medical and social history Use of alcohol, tobacco or illicit drugs  Current medications and supplements including opioid prescriptions. Functional ability and status Nutritional status Physical activity Advanced directives List of other physicians Hospitalizations, surgeries, and ER visits in previous 12 months Vitals Screenings to include cognitive, depression, and falls Referrals and appointments  No orders of the defined types were placed in this encounter.  In addition, I have reviewed and discussed with patient certain preventive protocols, quality metrics, and best practice recommendations. A written personalized care plan for preventive services as well as general preventive health recommendations were provided to patient.   Vina Ned, CMA   02/06/2024   Return in about 1 year (around 02/05/2025) for Medicare Annual Wellness Visit.  After Visit Summary: (Mail) Due to this being a telephonic visit, the after visit summary with patients personalized plan was offered to patient via mail   Nurse Notes:  Gave ph# to schedule [facility] (due ~ 03/19/24) Needs Tdap vaccine  (pharmacy) Declined Covid vaccine Patient declined to make another Medicare AWV because patient is unsure as to who she will choose as her new PCP.

## 2024-02-06 NOTE — Patient Instructions (Signed)
 Alexandra Watson,  Thank you for taking the time for your Medicare Wellness Visit. I appreciate your continued commitment to your health goals. Please review the care plan we discussed, and feel free to reach out if I can assist you further.  Please note that Annual Wellness Visits do not include a physical exam. Some assessments may be limited, especially if the visit was conducted virtually. If needed, we may recommend an in-person follow-up with your provider.  Ongoing Care Seeing your primary care provider every 3 to 6 months helps us  monitor your health and provide consistent, personalized care.   Referrals If a referral was made during today's visit and you haven't received any updates within two weeks, please contact the referred provider directly to check on the status.  Recommended Screenings:    You may get a tetanus vaccine at your local pharmacy at your convenience.  You will be due for your next Medicare Annual Wellness visit after 02/05/25 with your new PCP.  Please call to schedule your mammogram (due after 03/19/24):  [facility] Address: [address], [place], [place] Phone: [phone]  [facility] [address] [place], [place] [address] Phone: [phone]   Health Maintenance  Topic Date Due   DTaP/Tdap/Td vaccine (2 - Td or Tdap) 06/11/2019   COVID-19 Vaccine (5 - 2025-26 season) 10/28/2023   Medicare Annual Wellness Visit  01/10/2024   Breast Cancer Screening  03/19/2024   Osteoporosis screening with Bone Density Scan  03/19/2028   Pneumococcal Vaccine for age over 65  Completed   Flu Shot  Completed   Zoster (Shingles) Vaccine  Completed   Hepatitis C Screening  Addressed   Meningitis B Vaccine  Aged Out   Colon Cancer Screening  Discontinued       02/06/2024    1:19 PM  Advanced Directives  Does Patient Have a Medical Advance Directive? No  Would patient like  information on creating a medical advance directive? No - Patient declined    Vision: Annual vision screenings are recommended for early detection of glaucoma, cataracts, and diabetic retinopathy. These exams can also reveal signs of chronic conditions such as diabetes and high blood pressure.  Dental: Annual dental screenings help detect early signs of oral cancer, gum disease, and other conditions linked to overall health, including heart disease and diabetes.  Please see the attached documents for additional preventive care recommendations.

## 2024-04-03 ENCOUNTER — Other Ambulatory Visit: Payer: Self-pay

## 2024-04-03 DIAGNOSIS — I10 Essential (primary) hypertension: Secondary | ICD-10-CM

## 2024-04-03 MED ORDER — METOPROLOL TARTRATE 37.5 MG PO TABS: 1.0000 | ORAL_TABLET | Freq: Every day | ORAL | 0 refills | Status: AC
